# Patient Record
Sex: Female | Born: 1960
Health system: Southern US, Community
[De-identification: ages and names within clinical notes are randomized; demographics above are authoritative.]

## PROBLEM LIST (undated history)

## (undated) DIAGNOSIS — Z86718 Personal history of other venous thrombosis and embolism: Secondary | ICD-10-CM

## (undated) DIAGNOSIS — J45909 Unspecified asthma, uncomplicated: Secondary | ICD-10-CM

## (undated) DIAGNOSIS — R519 Headache, unspecified: Secondary | ICD-10-CM

## (undated) DIAGNOSIS — R896 Abnormal cytological findings in specimens from other organs, systems and tissues: Secondary | ICD-10-CM

## (undated) DIAGNOSIS — R569 Unspecified convulsions: Secondary | ICD-10-CM

## (undated) DIAGNOSIS — I10 Essential (primary) hypertension: Secondary | ICD-10-CM

## (undated) DIAGNOSIS — E785 Hyperlipidemia, unspecified: Secondary | ICD-10-CM

## (undated) DIAGNOSIS — H209 Unspecified iridocyclitis: Secondary | ICD-10-CM

## (undated) DIAGNOSIS — E669 Obesity, unspecified: Secondary | ICD-10-CM

## (undated) DIAGNOSIS — K219 Gastro-esophageal reflux disease without esophagitis: Secondary | ICD-10-CM

## (undated) DIAGNOSIS — T7840XA Allergy, unspecified, initial encounter: Secondary | ICD-10-CM

## (undated) DIAGNOSIS — R51 Headache: Secondary | ICD-10-CM

## (undated) DIAGNOSIS — IMO0002 Reserved for concepts with insufficient information to code with codable children: Secondary | ICD-10-CM

## (undated) HISTORY — DX: Obesity, unspecified: E66.9

## (undated) HISTORY — DX: Unspecified convulsions: R56.9

## (undated) HISTORY — DX: Hyperlipidemia, unspecified: E78.5

## (undated) HISTORY — DX: Allergy, unspecified, initial encounter: T78.40XA

## (undated) HISTORY — DX: Abnormal cytological findings in specimens from other organs, systems and tissues: R89.6

## (undated) HISTORY — DX: Unspecified iridocyclitis: H20.9

## (undated) HISTORY — PX: COLPOSCOPY: SHX161

## (undated) HISTORY — DX: Headache, unspecified: R51.9

## (undated) HISTORY — DX: Essential (primary) hypertension: I10

## (undated) HISTORY — PX: MOUTH SURGERY: SHX715

## (undated) HISTORY — PX: EYE SURGERY: SHX253

## (undated) HISTORY — DX: Headache: R51

## (undated) HISTORY — DX: Unspecified asthma, uncomplicated: J45.909

## (undated) HISTORY — DX: Reserved for concepts with insufficient information to code with codable children: IMO0002

## (undated) HISTORY — DX: Personal history of other venous thrombosis and embolism: Z86.718

## (undated) HISTORY — PX: TUBAL LIGATION: SHX77

## (undated) HISTORY — DX: Gastro-esophageal reflux disease without esophagitis: K21.9

---

## 2000-11-21 ENCOUNTER — Other Ambulatory Visit: Admission: RE | Admit: 2000-11-21 | Discharge: 2000-11-21 | Payer: Self-pay | Admitting: Gynecology

## 2002-01-17 ENCOUNTER — Other Ambulatory Visit: Admission: RE | Admit: 2002-01-17 | Discharge: 2002-01-17 | Payer: Self-pay | Admitting: Gynecology

## 2003-01-20 ENCOUNTER — Encounter: Payer: Self-pay | Admitting: Gastroenterology

## 2003-01-20 ENCOUNTER — Ambulatory Visit (HOSPITAL_COMMUNITY): Admission: RE | Admit: 2003-01-20 | Discharge: 2003-01-20 | Payer: Self-pay | Admitting: Gastroenterology

## 2003-07-01 ENCOUNTER — Other Ambulatory Visit: Admission: RE | Admit: 2003-07-01 | Discharge: 2003-07-01 | Payer: Self-pay | Admitting: Gynecology

## 2004-07-01 ENCOUNTER — Other Ambulatory Visit: Admission: RE | Admit: 2004-07-01 | Discharge: 2004-07-01 | Payer: Self-pay | Admitting: Gynecology

## 2004-08-06 DIAGNOSIS — IMO0002 Reserved for concepts with insufficient information to code with codable children: Secondary | ICD-10-CM

## 2004-08-06 HISTORY — DX: Reserved for concepts with insufficient information to code with codable children: IMO0002

## 2005-02-14 ENCOUNTER — Other Ambulatory Visit: Admission: RE | Admit: 2005-02-14 | Discharge: 2005-02-14 | Payer: Self-pay | Admitting: Gynecology

## 2005-07-06 ENCOUNTER — Other Ambulatory Visit: Admission: RE | Admit: 2005-07-06 | Discharge: 2005-07-06 | Payer: Self-pay | Admitting: Gynecology

## 2006-01-11 ENCOUNTER — Other Ambulatory Visit: Admission: RE | Admit: 2006-01-11 | Discharge: 2006-01-11 | Payer: Self-pay | Admitting: Gynecology

## 2006-07-07 DIAGNOSIS — IMO0001 Reserved for inherently not codable concepts without codable children: Secondary | ICD-10-CM

## 2006-07-07 HISTORY — DX: Reserved for inherently not codable concepts without codable children: IMO0001

## 2006-07-12 ENCOUNTER — Other Ambulatory Visit: Admission: RE | Admit: 2006-07-12 | Discharge: 2006-07-12 | Payer: Self-pay | Admitting: Gynecology

## 2006-10-25 ENCOUNTER — Other Ambulatory Visit: Admission: RE | Admit: 2006-10-25 | Discharge: 2006-10-25 | Payer: Self-pay | Admitting: Gynecology

## 2007-02-05 HISTORY — PX: ENDOMETRIAL ABLATION: SHX621

## 2007-02-05 HISTORY — PX: OTHER SURGICAL HISTORY: SHX169

## 2007-02-21 ENCOUNTER — Ambulatory Visit (HOSPITAL_BASED_OUTPATIENT_CLINIC_OR_DEPARTMENT_OTHER): Admission: RE | Admit: 2007-02-21 | Discharge: 2007-02-21 | Payer: Self-pay | Admitting: Gynecology

## 2007-02-21 ENCOUNTER — Encounter (INDEPENDENT_AMBULATORY_CARE_PROVIDER_SITE_OTHER): Payer: Self-pay | Admitting: Specialist

## 2007-07-18 ENCOUNTER — Other Ambulatory Visit: Admission: RE | Admit: 2007-07-18 | Discharge: 2007-07-18 | Payer: Self-pay | Admitting: Gynecology

## 2007-12-27 ENCOUNTER — Ambulatory Visit (HOSPITAL_COMMUNITY): Admission: RE | Admit: 2007-12-27 | Discharge: 2007-12-27 | Payer: Self-pay | Admitting: Ophthalmology

## 2008-01-27 ENCOUNTER — Other Ambulatory Visit: Admission: RE | Admit: 2008-01-27 | Discharge: 2008-01-27 | Payer: Self-pay | Admitting: Gynecology

## 2008-07-21 ENCOUNTER — Ambulatory Visit: Payer: Self-pay | Admitting: Gynecology

## 2008-07-21 ENCOUNTER — Encounter: Payer: Self-pay | Admitting: Gynecology

## 2008-07-21 ENCOUNTER — Other Ambulatory Visit: Admission: RE | Admit: 2008-07-21 | Discharge: 2008-07-21 | Payer: Self-pay | Admitting: Gynecology

## 2008-07-30 ENCOUNTER — Ambulatory Visit: Payer: Self-pay | Admitting: Gynecology

## 2009-07-22 ENCOUNTER — Encounter: Payer: Self-pay | Admitting: Gynecology

## 2009-07-22 ENCOUNTER — Other Ambulatory Visit: Admission: RE | Admit: 2009-07-22 | Discharge: 2009-07-22 | Payer: Self-pay | Admitting: Gynecology

## 2009-07-22 ENCOUNTER — Ambulatory Visit: Payer: Self-pay | Admitting: Gynecology

## 2009-09-28 ENCOUNTER — Ambulatory Visit: Payer: Self-pay | Admitting: Gynecology

## 2010-08-02 ENCOUNTER — Other Ambulatory Visit: Admission: RE | Admit: 2010-08-02 | Discharge: 2010-08-02 | Payer: Self-pay | Admitting: Gynecology

## 2010-08-02 ENCOUNTER — Ambulatory Visit: Payer: Self-pay | Admitting: Gynecology

## 2010-08-11 ENCOUNTER — Ambulatory Visit: Payer: Self-pay | Admitting: Gynecology

## 2011-03-24 NOTE — H&P (Signed)
NAME:  Claire Sutton, Claire Sutton NO.:  [id number]   MEDICAL RECORD NO.:  [id number]          PATIENT TYPE:  AMB   LOCATION:  [facility]                         FACILITY:  [facility]   PHYSICIAN:  Timothy P. Fontaine, M.D.DATE OF BIRTH:  [birth year]   DATE OF ADMISSION:  DATE OF DISCHARGE:                              HISTORY & PHYSICAL   Being admitted to [facility] on February 21, 2007 at 7:30  a.m. for surgery.   CHIEF COMPLAINT:  1. Menorrhagia.  2. Bartholin cyst.   HISTORY OF PRESENT ILLNESS:  A 50 year old G89, P2, AB1 female with a  history of postpartum tubal sterilization and a history of menorrhagia  that is interfering with her lifestyle.  The patient finds that she  needs to wear both tampons and pads; has bleed through episodes; and  wants to proceed with endometrial ablation.  The patient also has a left  Bartholin cyst that she has had for some time which she also wants to  have marsupialized at the same time as the ablation.   PAST MEDICAL HISTORY:  Uncomplicated.   PAST SURGICAL HISTORY:  Postpartum tubal sterilization.   CURRENT MEDICATIONS:  Xyzal for pruritus taken p.r.n. due to skin rash.   ALLERGIES:  No medications.   REVIEW OF SYSTEMS:  Noncontributory.   FAMILY HISTORY:  Noncontributory.   SOCIAL HISTORY:  Noncontributory.   ADMISSION PHYSICAL EXAM:  VITAL SIGNS:  Afebrile.  Vital signs stable.  HEENT: Normal.  LUNGS:  Clear.  CARDIAC:  Regular rate without rubs, murmurs or gallops.  ABDOMINAL EXAM:  Benign.  PELVIC:  External with 2-3 cm tense left Bartholin cyst, classic in  appearance, nontender.  Vagina and cervix normal.  Bimanual, uterus  normal size, midline mobile, nontender.  Adnexa without masses or  tenderness.   ASSESSMENT:  A 50 year old G58, P2, AB1 female status post postpartum  tubal sterilization with menorrhagia who underwent evaluation to include  normal thyroid function and a negative sonohistogram.  She is  known to  be anemic with a hemoglobin of 11.  Options for management were  reviewed; and she wants to proceed with endometrial ablation NovaSure  technique.  The patient also has a persistent left Bartholin cyst; that  she wants to go ahead and have marsupialized at the time of this  procedure.  I reviewed the proposed surgery with the patient.  The long-  term and short-term issues, acute intraoperative postoperative risks,  postoperative care, and follow-up all reviewed with her.  She  understands there are no guarantees as far as menorrhagia relief.  Her  periods may persistently continue to be heavy, or may resume being heavy  after some brief period of lightness; and she also understands that  there is no guarantee that this will eliminate all periods, as she will  more than likely continue to have periods, but hopefully much lighter.  The patient also understands that following ablation she should never  achieve pregnancy; and even though she has a tubal sterilization.  She  should do nothing to achieve pregnancy following the procedure as this  could be  dangerous.  What is involved with the procedure to include:  Dilatation, hysteroscope, curettage, and use of the endometrial ablator  were discussed.  The risks of injury during the process to include:  Uterine perforation, transuterine thermal damage, injury to bowel,  bladder, ureters, vessels, and nerves either immediately recognized or  delay recognized requiring emergency reparative surgeries; including  bowel resection, and ostomy formation was all discussed, understood and  accepted.  The risks of infection as well as the risk of hemorrhage  necessitating transfusion was reviewed with her.  She understands during  the marsupialization process we are not excising the cyst, but we are  going to create a window to allow drainage; and that she may have some  small deformity in this area such as a dimpling defect following the   procedure; and the risks of bleeding, infection as well as the risk of  pain either immediately postoperative as well as long-term vulvar  vaginal pain both spontaneous and dyspareunia were discussed,  understood, and accepted.  The patient's questions were answered to her  satisfaction.  She is ready to proceed with surgery.      Timothy P. Fontaine, M.D.  Electronically Signed     TPF/MEDQ  D:  02/15/2007  T:  02/15/2007  Job:  [id number]

## 2011-03-24 NOTE — Op Note (Signed)
NAME:  Claire Sutton, Claire Sutton NO.:  [id number]   MEDICAL RECORD NO.:  [id number]          PATIENT TYPE:  AMB   LOCATION:  [facility]                         FACILITY:  [facility]   PHYSICIAN:  Timothy P. Fontaine, M.D.DATE OF BIRTH:  12-26-1960   DATE OF PROCEDURE:  02/21/2007  DATE OF DISCHARGE:                               OPERATIVE REPORT   LOCATION:  [facility].   PREOPERATIVE DIAGNOSES:  1. Menorrhagia.  2. Left Bartholin cyst.   POSTOPERATIVE DIAGNOSES:  1. Menorrhagia.  2. Left Bartholin cyst.   PROCEDURE:  1. NovaSure endometrial ablation.  2. Hysteroscopy.  3. Marsupialization, left Bartholin cyst.   SURGEON:  Timothy P. Fontaine, M.D.   ANESTHETIC:  General with 1% lidocaine paracervical block.   SPECIMEN:  Bartholin cyst wall.   ESTIMATED BLOOD LOSS:  Minimal.   HYSTEROSCOPIC DISTENDING MEDIAN DISCREPANCY:  Minimal.   COMPLICATIONS:  None.   FINDINGS:  EUA:  Three-centimeter tense left Bartholin cyst, vulva  otherwise normal.  Speculum:  Vagina normal.  Cervix normal.  Bimanual:  Uterus normal size, midline and mobile.  Adnexa without masses.  Hysteroscopic:  Cavities is grossly normal, noting fundus, anterior and  posterior uterine surfaces, right lower uterine segment, endocervical  canal normal with good post-ablative even treatment noted.  NovaSure  measurements:  Sounding length 9 cm, cervical length 3.5 cm, cavity  length 5.5 cm, cavity width 4.3, power of 130 for 105 seconds.   PROCEDURE:  The patient was taken to the operating room, underwent  general anesthesia, was placed in the low dorsal lithotomy position, and  received a perineovaginal preparation with Betadine solution.  The  patient was draped in the usual fashion.  The patient was not  catheterized, as she voided a large amount immediately prior to moving  to the operating room.  A bimanual EUA was performed.  Subsequently, the  cervix was visualized with a speculum,  anterior lip grasped with a  single-tooth tenaculum and a paracervical block using 1% lidocaine, a  total of 10 mL, was placed.  Cervix was gently and gradually dilated,  determining both the cervical length and the sounding length at this  time.  Subsequently, the NovaSure instrument was placed within the  cavity, engaged, manipulated to assure proper placement, cavity width  determined and the carbon dioxide test was performed and passed.  Subsequently, the ablation was performed without difficulty, the  instrument removed.  Hysteroscopy showed a normal even-treated cavity  with good distention, no evidence of perforation.  Tenaculum was  removed, speculum removed and attention was then turned to the Bartholin  cyst.  The labia minora on the left were retracted laterally and on the  inner surface of the introital opening, just exterior to the hymenal  ring, an elliptical mucosal incision was made and the overlying skin  removed.  The mucosal tissues were incised to expose the cyst wall,  which was subsequently entered and had a greenish cyst fluid.  A large  portion of the cyst wall was excised, sent to Pathology and the  remaining cyst wall was then sutured to the  surrounding mucosa in  interrupted 3-0 Vicryl to marsupialize the remaining cyst cavity.  Digital palpation of the remaining cavity revealed a smooth  architecture; there was no evidence of solid elements.  Adequate  hemostasis was visualized.  The patient was placed in the supine  position, awakened without difficulty and taken to the recovery room in  good condition, having tolerated the procedure well.      Timothy P. Fontaine, M.D.  Electronically Signed     TPF/MEDQ  D:  02/21/2007  T:  02/21/2007  Job:  [id number]

## 2011-07-28 DIAGNOSIS — IMO0002 Reserved for concepts with insufficient information to code with codable children: Secondary | ICD-10-CM | POA: Insufficient documentation

## 2011-07-31 LAB — ANA: Anti Nuclear Antibody(ANA): NEGATIVE

## 2011-07-31 LAB — C-REACTIVE PROTEIN: CRP: 0.5 — ABNORMAL LOW (ref <0.6)

## 2011-07-31 LAB — COMPREHENSIVE METABOLIC PANEL
ALT: 24
Albumin: 3.5
Calcium: 9.3
GFR calc Af Amer: >60
Glucose, Bld: 90
Sodium: 136
Total Protein: 6.6

## 2011-07-31 LAB — KETONES, QUALITATIVE: Acetone, Bld: NEGATIVE

## 2011-07-31 LAB — LYSOZYME, SERUM: Lysozyme: 13 ug/mL (ref 9–17)

## 2011-08-04 ENCOUNTER — Other Ambulatory Visit (HOSPITAL_COMMUNITY)
Admission: RE | Admit: 2011-08-04 | Discharge: 2011-08-04 | Disposition: A | Discharge status: Home / Self Care | Payer: 59 | Source: Ambulatory Visit | Attending: Gynecology | Admitting: Gynecology

## 2011-08-04 ENCOUNTER — Ambulatory Visit (INDEPENDENT_AMBULATORY_CARE_PROVIDER_SITE_OTHER): Payer: 59 | Admitting: Gynecology

## 2011-08-04 ENCOUNTER — Encounter: Payer: Self-pay | Admitting: Gynecology

## 2011-08-04 VITALS — BP 130/84 | Ht 65.0 in | Wt 230.0 lb

## 2011-08-04 DIAGNOSIS — B373 Candidiasis of vulva and vagina: Secondary | ICD-10-CM

## 2011-08-04 DIAGNOSIS — Z13220 Encounter for screening for lipoid disorders: Secondary | ICD-10-CM

## 2011-08-04 DIAGNOSIS — Z131 Encounter for screening for diabetes mellitus: Secondary | ICD-10-CM

## 2011-08-04 DIAGNOSIS — Z01419 Encounter for gynecological examination (general) (routine) without abnormal findings: Secondary | ICD-10-CM

## 2011-08-04 DIAGNOSIS — N951 Menopausal and female climacteric states: Secondary | ICD-10-CM

## 2011-08-04 DIAGNOSIS — N898 Other specified noninflammatory disorders of vagina: Secondary | ICD-10-CM

## 2011-08-04 DIAGNOSIS — E559 Vitamin D deficiency, unspecified: Secondary | ICD-10-CM

## 2011-08-04 MED ORDER — FLUCONAZOLE 150 MG PO TABS: 150.0000 mg | ORAL_TABLET | Freq: Once | ORAL | Status: AC

## 2011-08-04 NOTE — Progress Notes (Signed)
Claire Sutton [birth year] [id number]        50 y.o.  for annual exam.  Overall doing well. She does note the onset of some hot flushes and night sweats this past year. She is status post endometrial ablation is having no menses.  Notes an itchy discharge that comes and goes.  Past medical history,surgical history, medications, allergies, family history and social history were all reviewed and documented in the EPIC chart. ROS:  Was performed and pertinent positives and negatives are included in the history.  Exam: chaperone present Filed Vitals:   08/04/11 0954  BP: 130/84   General appearance  Normal Skin grossly normal Head/Neck normal with no cervical or supraclavicular adenopathy thyroid normal Lungs  clear Cardiac RR, without RMG Abdominal  soft, nontender, without masses, organomegaly or hernia Breasts  examined lying and sitting without masses, retractions, discharge or axillary adenopathy. Pelvic  Ext/BUS/vagina  normal with white discharge KOH wet prep done  Cervix  normal  Pap done  Uterus  anteverted, normal size, shape and contour, midline and mobile nontender   Adnexa  Without masses or tenderness    Anus and perineum  normal   Rectovaginal  normal sphincter tone without palpated masses or tenderness.    Assessment/Plan:  50 y.o. female for annual exam.    1. White discharge. KOH Wet prep positive for yeast we'll treat with Diflucan 150x1 dose follow up if symptoms persist or recur. 2. Menopausal symptoms. Patient is having some hot flashes and sweats. She is amenorrheic from her ablation. FSH checked last year was normal.  Will recheck baseline FSH. OTC soy recommended. I reviewed in general HRT and the quality of life issue and if her hot flushes become worse she'll represent for HRT discussion. 3. Health maintenance. Self breast exams on a monthly basis discussed encouraged. Is due for mammography in November knows to schedule this. I recommend a baseline colonoscopy  and I gave her names of groups increase her to call and arrange.  She has been known to be low and vitamin D before and ordered a vitamin D level along with routine glucose lipid profile CBC and urinalysis. Assuming she continues well from a gynecologic standpoint she does well with the soy she'll see me in a year sooner if menopausal symptoms worsen and/or any other issue.   Dara Lords MD, 10:40 AM 08/04/2011

## 2011-08-05 LAB — VITAMIN D 25 HYDROXY (VIT D DEFICIENCY, FRACTURES): Vit D, 25-Hydroxy: 38 ng/mL (ref 30–89)

## 2011-08-07 ENCOUNTER — Other Ambulatory Visit: Payer: Self-pay | Admitting: *Deleted

## 2011-08-07 DIAGNOSIS — D649 Anemia, unspecified: Secondary | ICD-10-CM

## 2011-08-07 DIAGNOSIS — E7800 Pure hypercholesterolemia, unspecified: Secondary | ICD-10-CM

## 2011-08-09 ENCOUNTER — Other Ambulatory Visit (INDEPENDENT_AMBULATORY_CARE_PROVIDER_SITE_OTHER): Payer: 59 | Admitting: *Deleted

## 2011-08-09 DIAGNOSIS — E7800 Pure hypercholesterolemia, unspecified: Secondary | ICD-10-CM

## 2011-08-09 DIAGNOSIS — D649 Anemia, unspecified: Secondary | ICD-10-CM

## 2011-09-04 ENCOUNTER — Telehealth: Payer: Self-pay | Admitting: *Deleted

## 2011-09-04 NOTE — Telephone Encounter (Signed)
Patient called for results.  Gave results.

## 2011-10-11 ENCOUNTER — Ambulatory Visit: Payer: Self-pay

## 2011-10-11 DIAGNOSIS — T887XXA Unspecified adverse effect of drug or medicament, initial encounter: Secondary | ICD-10-CM

## 2011-10-11 DIAGNOSIS — R220 Localized swelling, mass and lump, head: Secondary | ICD-10-CM

## 2011-10-11 DIAGNOSIS — Z0189 Encounter for other specified special examinations: Secondary | ICD-10-CM

## 2011-10-20 ENCOUNTER — Ambulatory Visit: Payer: Self-pay

## 2011-10-20 DIAGNOSIS — L509 Urticaria, unspecified: Secondary | ICD-10-CM

## 2011-10-20 DIAGNOSIS — L299 Pruritus, unspecified: Secondary | ICD-10-CM

## 2011-10-20 DIAGNOSIS — R21 Rash and other nonspecific skin eruption: Secondary | ICD-10-CM

## 2011-11-27 ENCOUNTER — Ambulatory Visit: Payer: Self-pay | Admitting: Internal Medicine

## 2011-11-27 DIAGNOSIS — Z719 Counseling, unspecified: Secondary | ICD-10-CM

## 2011-11-27 DIAGNOSIS — J301 Allergic rhinitis due to pollen: Secondary | ICD-10-CM

## 2012-10-11 ENCOUNTER — Encounter: Payer: Self-pay | Admitting: Gynecology

## 2012-10-25 ENCOUNTER — Encounter: Payer: Self-pay | Admitting: Gynecology

## 2012-10-25 ENCOUNTER — Ambulatory Visit (INDEPENDENT_AMBULATORY_CARE_PROVIDER_SITE_OTHER): Payer: BC Managed Care – PPO | Admitting: Gynecology

## 2012-10-25 VITALS — BP 118/70 | Ht 65.75 in | Wt 175.0 lb

## 2012-10-25 DIAGNOSIS — L292 Pruritus vulvae: Secondary | ICD-10-CM

## 2012-10-25 DIAGNOSIS — L293 Anogenital pruritus, unspecified: Secondary | ICD-10-CM

## 2012-10-25 DIAGNOSIS — Z01419 Encounter for gynecological examination (general) (routine) without abnormal findings: Secondary | ICD-10-CM

## 2012-10-25 MED ORDER — FLUCONAZOLE 150 MG PO TABS: 150.0000 mg | ORAL_TABLET | Freq: Once | ORAL | Status: DC

## 2012-10-25 NOTE — Progress Notes (Signed)
Claire Sutton 1961-05-08 [id number]        51 y.o.  [id number] for annual exam.    Past medical history,surgical history, medications, allergies, family history and social history were all reviewed and documented in the EPIC chart. ROS:  Was performed and pertinent positives and negatives are included in the history.  Exam: Sherrilyn Rist assistant Filed Vitals:   10/25/12 1148  BP: 118/70  Height: 5' 5.75" (1.67 m)  Weight: 175 lb (79.379 kg)   General appearance  Normal Skin grossly normal Head/Neck normal with no cervical or supraclavicular adenopathy thyroid normal Lungs  clear Cardiac RR, without RMG Abdominal  soft, nontender, without masses, organomegaly or hernia Breasts  examined lying and sitting without masses, retractions, discharge or axillary adenopathy. Pelvic  Ext/BUS/vagina  normal   Cervix  normal   Uterus  anteverted, normal size, shape and contour, midline and mobile nontender   Adnexa  Without masses or tenderness    Anus and perineum  normal   Rectovaginal  normal sphincter tone without palpated masses or tenderness.    Assessment/Plan:  51 y.o. [id number] female for annual exam.   1. Amenorrhea since ablation. Occasional hot flushes but not bothersome. FSH checked last year was normal. We'll monitor at present. 2. Intermittent occasional vulvar pruritus. Not bothersome today. Exam was normal. I did give her Diflucan 150 mg #3 pills that were available to use when necessary if she does develop itching as I suspect this is yeast related. 3. Pap smear 07/2011. No Pap smear done today. History of low-grade SIL 2005/2006. Pap smears all normal annual leave afterwards. We'll plan every 3 year to 5 years screening. 4. Mammography 2011. Patient knows she needs to schedule now agrees to do so. SBE monthly reviewed. 5. Colonoscopy. Arrange for screening colonoscopy at [facility] or [facility]. 6. DEXA. Plan Baseline closure to 60. Increase calcium vitamin D. 7. Health maintenance.  Patient reports having routine blood work at Dr. [facility] office. Continue to follow up with him 4 routine health care and blood work. Follow up for GYN exam in 1 year.    Dara Lords MD, 12:08 PM 10/25/2012

## 2012-10-25 NOTE — Patient Instructions (Addendum)
Call to schedule screening COLONOSCOPY at [facility] or [facility]   Call to Schedule your mammogram  Facilities in [place]: 1)  [facility] [place], [place] [address] Phone: [phone] 2)  [facility], [phone] Phone: [phone] 3)  Dr. Yolanda Bonine at [facility] [address] Phone: [phone]     Mammogram A mammogram is an X-ray test to find changes in a woman's breast. You should get a mammogram if:  You are 50 years of age or older  You have risk factors.   Your doctor recommends that you have one.  BEFORE THE TEST  Do not schedule the test the week before your period, especially if your breasts are sore during this time.  On the day of your mammogram:  Wash your breasts and armpits well. After washing, do not put on any deodorant or talcum powder on until after your test.   Eat and drink as you usually do.   Take your medicines as usual.   If you are diabetic and take insulin, make sure you:   Eat before coming for your test.   Take your insulin as usual.   If you cannot keep your appointment, call before the appointment to cancel. Schedule another appointment.  TEST  You will need to undress from the waist up. You will put on a hospital gown.   Your breast will be put on the mammogram machine, and it will press firmly on your breast with a piece of plastic called a compression paddle. This will make your breast flatter so that the machine can X-ray all parts of your breast.   Both breasts will be X-rayed. Each breast will be X-rayed from above and from the side. An X-ray might need to be taken again if the picture is not good enough.   The mammogram will last about 15 to 30 minutes.  AFTER THE TEST Finding out the results of your test Ask when your test results will be ready. Make sure you get your test results.  Document  Released: 01/19/2009 Document Revised: 10/12/2011 Document Reviewed: 01/19/2009 [facility] Patient Information 2012 [place], [place].

## 2012-10-26 LAB — URINALYSIS W MICROSCOPIC + REFLEX CULTURE
Casts: NONE SEEN
Hgb urine dipstick: NEGATIVE
Leukocytes, UA: NEGATIVE
Nitrite: NEGATIVE
Squamous Epithelial / LPF: NONE SEEN
pH: 6.5 (ref 5.0–8.0)

## 2013-06-02 ENCOUNTER — Other Ambulatory Visit: Payer: Self-pay | Admitting: Gastroenterology

## 2013-07-24 ENCOUNTER — Other Ambulatory Visit: Payer: Self-pay | Admitting: Internal Medicine

## 2013-07-24 DIAGNOSIS — Z1231 Encounter for screening mammogram for malignant neoplasm of breast: Secondary | ICD-10-CM

## 2013-08-13 ENCOUNTER — Ambulatory Visit
Admission: RE | Admit: 2013-08-13 | Discharge: 2013-08-13 | Disposition: A | Discharge status: Home / Self Care | Payer: BC Managed Care – PPO | Source: Ambulatory Visit | Attending: Internal Medicine | Admitting: Internal Medicine

## 2013-08-13 DIAGNOSIS — Z1231 Encounter for screening mammogram for malignant neoplasm of breast: Secondary | ICD-10-CM

## 2013-10-28 ENCOUNTER — Ambulatory Visit (INDEPENDENT_AMBULATORY_CARE_PROVIDER_SITE_OTHER): Payer: BC Managed Care – PPO | Admitting: Gynecology

## 2013-10-28 ENCOUNTER — Encounter: Payer: Self-pay | Admitting: Gynecology

## 2013-10-28 VITALS — BP 112/78 | Ht 66.75 in | Wt 193.8 lb

## 2013-10-28 DIAGNOSIS — Z01419 Encounter for gynecological examination (general) (routine) without abnormal findings: Secondary | ICD-10-CM

## 2013-10-28 DIAGNOSIS — N951 Menopausal and female climacteric states: Secondary | ICD-10-CM

## 2013-10-28 NOTE — Progress Notes (Signed)
Claire Sutton 12-25-1960 [id number]        52 y.o.  [id number] for annual exam.  Doing well.  Past medical history,surgical history, problem list, medications, allergies, family history and social history were all reviewed and documented in the EPIC chart.  ROS:  Performed and pertinent positives and negatives are included in the history, assessment and plan .  Exam: Sherrilyn Rist assistant Filed Vitals:   10/28/13 0907  BP: 112/78  Height: 5' 6.75" (1.695 m)  Weight: 193 lb 12.8 oz (87.907 kg)   General appearance  Normal Skin grossly normal Head/Neck normal with no cervical or supraclavicular adenopathy thyroid normal Lungs  clear Cardiac RR, without RMG Abdominal  soft, nontender, without masses, organomegaly or hernia Breasts  examined lying and sitting without masses, retractions, discharge or axillary adenopathy. Pelvic  Ext/BUS/vagina  Normal with mild atrophic changes  Cervix  Normal   Uterus  anteverted, normal size, shape and contour, midline and mobile nontender   Adnexa  Without masses or tenderness    Anus and perineum  Normal   Rectovaginal  Normal sphincter tone without palpated masses or tenderness.    Assessment/Plan:  52 y.o. [id number] female for annual exam.   1. Amenorrheic , mild hot flushes. FSH normal 2 years ago. Recheck FSH now. Has remained amenorrheic since her endometrial ablation. Patient will followup for results. Monitor symptoms at present. If worsens and wants to discuss HRT will represent. If any bleeding will represent. 2. Pap smear 2012. No Pap smear done today. History of LGSIL 2005. Followup Pap smears normal. Plan repeat Pap smear next year at 3 year interval. 3. Mammography 08/2013. Continue with annual mammography. SBE monthly reviewed. 4. Colonoscopy 2014. Repeat at their recommended interval. 5. DEXA never. Plan closer to 59. 6. Health maintenance. No routine blood work done as this is all done through her primary physician's office. Followup in  one year, sooner as needed.   Note: This document was prepared with digital dictation and possible smart phrase technology. Any transcriptional errors that result from this process are unintentional.   Dara Lords MD, 9:43 AM 10/28/2013

## 2013-10-28 NOTE — Patient Instructions (Signed)
Follow up in one year, sooner as needed. 

## 2013-10-29 LAB — URINALYSIS W MICROSCOPIC + REFLEX CULTURE
Casts: NONE SEEN
Glucose, UA: NEGATIVE mg/dL
Hgb urine dipstick: NEGATIVE
Leukocytes, UA: NEGATIVE
pH: 5.5 (ref 5.0–8.0)

## 2014-06-16 DIAGNOSIS — H15002 Unspecified scleritis, left eye: Secondary | ICD-10-CM | POA: Insufficient documentation

## 2014-06-16 DIAGNOSIS — H3022 Posterior cyclitis, left eye: Secondary | ICD-10-CM | POA: Insufficient documentation

## 2014-06-16 DIAGNOSIS — H2012 Chronic iridocyclitis, left eye: Secondary | ICD-10-CM

## 2014-06-16 DIAGNOSIS — H15102 Unspecified episcleritis, left eye: Secondary | ICD-10-CM

## 2014-08-17 ENCOUNTER — Other Ambulatory Visit: Payer: Self-pay | Admitting: Gynecology

## 2014-08-17 DIAGNOSIS — Z1231 Encounter for screening mammogram for malignant neoplasm of breast: Secondary | ICD-10-CM

## 2014-08-18 ENCOUNTER — Ambulatory Visit (HOSPITAL_BASED_OUTPATIENT_CLINIC_OR_DEPARTMENT_OTHER)
Admission: RE | Admit: 2014-08-18 | Discharge: 2014-08-18 | Disposition: A | Discharge status: Home / Self Care | Payer: Self-pay | Source: Ambulatory Visit | Attending: Gynecology | Admitting: Gynecology

## 2014-08-18 DIAGNOSIS — Z1231 Encounter for screening mammogram for malignant neoplasm of breast: Secondary | ICD-10-CM | POA: Insufficient documentation

## 2014-09-07 ENCOUNTER — Encounter: Payer: Self-pay | Admitting: Gynecology

## 2014-11-03 ENCOUNTER — Encounter: Payer: BC Managed Care – PPO | Admitting: Gynecology

## 2014-11-12 ENCOUNTER — Encounter: Payer: BC Managed Care – PPO | Admitting: Gynecology

## 2014-11-13 ENCOUNTER — Other Ambulatory Visit (HOSPITAL_COMMUNITY)
Admission: RE | Admit: 2014-11-13 | Discharge: 2014-11-13 | Disposition: A | Discharge status: Home / Self Care | Payer: 59 | Source: Ambulatory Visit | Attending: Gynecology | Admitting: Gynecology

## 2014-11-13 ENCOUNTER — Encounter: Payer: Self-pay | Admitting: Gynecology

## 2014-11-13 ENCOUNTER — Ambulatory Visit (INDEPENDENT_AMBULATORY_CARE_PROVIDER_SITE_OTHER): Payer: 59 | Admitting: Gynecology

## 2014-11-13 VITALS — BP 124/84 | Ht 66.0 in | Wt 235.0 lb

## 2014-11-13 DIAGNOSIS — Z01419 Encounter for gynecological examination (general) (routine) without abnormal findings: Secondary | ICD-10-CM | POA: Insufficient documentation

## 2014-11-13 DIAGNOSIS — Z1151 Encounter for screening for human papillomavirus (HPV): Secondary | ICD-10-CM | POA: Diagnosis present

## 2014-11-13 DIAGNOSIS — R635 Abnormal weight gain: Secondary | ICD-10-CM

## 2014-11-13 LAB — URINALYSIS W MICROSCOPIC + REFLEX CULTURE
BILIRUBIN URINE: NEGATIVE
Bacteria, UA: NONE SEEN
Casts: NONE SEEN
Crystals: NONE SEEN
GLUCOSE, UA: NEGATIVE mg/dL
HGB URINE DIPSTICK: NEGATIVE
KETONES UR: NEGATIVE mg/dL
LEUKOCYTES UA: NEGATIVE
NITRITE: NEGATIVE
Protein, ur: NEGATIVE mg/dL
SPECIFIC GRAVITY, URINE: 1.017 (ref 1.005–1.030)
Squamous Epithelial / LPF: NONE SEEN
UROBILINOGEN UA: 0.2 mg/dL (ref 0.0–1.0)
pH: 5.5 (ref 5.0–8.0)

## 2014-11-13 LAB — THYROID PANEL
FREE T4: 1.04 ng/dL (ref 0.80–1.80)
T3 UPTAKE: 26 % (ref 22–35)
T4 TOTAL: 6.9 ug/dL (ref 4.5–12.0)
TSH: 1.686 u[IU]/mL (ref 0.350–4.500)

## 2014-11-13 LAB — FOLLICLE STIMULATING HORMONE: FSH: 5.8 m[IU]/mL

## 2014-11-13 NOTE — Progress Notes (Signed)
AZARIAH LATENDRESSE 1961/05/20 [id number]        54 y.o.  [id number] for annual exam.  Several issues noted below.  Past medical history,surgical history, problem list, medications, allergies, family history and social history were all reviewed and documented as reviewed in the EPIC chart.  ROS:  Performed with pertinent positives and negatives included in the history, assessment and plan.   Additional significant findings :  none   Exam: Kim Counsellor Vitals:   11/13/14 0800  BP: 124/84  Height: 5\' 6"  (1.676 m)  Weight: 235 lb (106.595 kg)   General appearance:  Normal affect, orientation and appearance. Skin: Grossly normal HEENT: Without gross lesions.  No cervical or supraclavicular adenopathy. Thyroid normal.  Lungs:  Clear without wheezing, rales or rhonchi Cardiac: RR, without RMG Abdominal:  Soft, nontender, without masses, guarding, rebound, organomegaly or hernia Breasts:  Examined lying and sitting without masses, retractions, discharge or axillary adenopathy. Pelvic:  Ext/BUS/vagina normal  Cervix normal. Pap/HPV  Uterus anteverted, normal size, shape and contour, midline and mobile nontender   Adnexa  Without masses or tenderness    Anus and perineum  Normal   Rectovaginal  Normal sphincter tone without palpated masses or tenderness.    Assessment/Plan:  54 y.o. [id number] female for annual exam without menses, not sexually active.   1. Amenorrhea. Patient status post endometrial ablation. Not having significant hot flashes or night sweats. Will check [facility] for menopausal status. Call if any  Bleeding. 2. Weight gain. Patient notes 40 pounds of weight gain over the past year or so. Admits to not exercising on a regular basis. Check thyroid panel. Encouraged regular exercise and diet modification. 3. Pap smear 2012. Pap/HPV today. No history of significant abnormal Pap smears. 4. Mammography 08/2014. Continue with annual mammography. SBE monthly reviewed. 5. Colonoscopy  2014. Repeat at their recommended interval. 6. Health maintenance. No routine blood work done as patient reports this done at her primary physician's office. Follow up 1 year, sooner as needed.      Anastasio Auerbach MD, 8:24 AM 11/13/2014

## 2014-11-13 NOTE — Patient Instructions (Signed)
You may obtain a copy of any labs that were done today by logging onto MyChart as outlined in the instructions provided with your AVS (after visit summary). The office will not call with normal lab results but certainly if there are any significant abnormalities then we will contact you.   Health Maintenance, Female A healthy lifestyle and preventative care can promote health and wellness.  Maintain regular health, dental, and eye exams.  Eat a healthy diet. Foods like vegetables, fruits, whole grains, low-fat dairy products, and lean protein foods contain the nutrients you need without too many calories. Decrease your intake of foods high in solid fats, added sugars, and salt. Get information about a proper diet from your caregiver, if necessary.  Regular physical exercise is one of the most important things you can do for your health. Most adults should get at least 150 minutes of moderate-intensity exercise (any activity that increases your heart rate and causes you to sweat) each week. In addition, most adults need muscle-strengthening exercises on 2 or more days a week.   Maintain a healthy weight. The body mass index (BMI) is a screening tool to identify possible weight problems. It provides an estimate of body fat based on height and weight. Your caregiver can help determine your BMI, and can help you achieve or maintain a healthy weight. For adults 20 years and older:  A BMI below 18.5 is considered underweight.  A BMI of 18.5 to 24.9 is normal.  A BMI of 25 to 29.9 is considered overweight.  A BMI of 30 and above is considered obese.  Maintain normal blood lipids and cholesterol by exercising and minimizing your intake of saturated fat. Eat a balanced diet with plenty of fruits and vegetables. Blood tests for lipids and cholesterol should begin at age 61 and be repeated every 5 years. If your lipid or cholesterol levels are high, you are over 50, or you are a high risk for heart  disease, you may need your cholesterol levels checked more frequently.Ongoing high lipid and cholesterol levels should be treated with medicines if diet and exercise are not effective.  If you smoke, find out from your caregiver how to quit. If you do not use tobacco, do not start.  Lung cancer screening is recommended for adults aged 33 80 years who are at high risk for developing lung cancer because of a history of smoking. Yearly low-dose computed tomography (CT) is recommended for people who have at least a 30-pack-year history of smoking and are a current smoker or have quit within the past 15 years. A pack year of smoking is smoking an average of 1 pack of cigarettes a day for 1 year (for example: 1 pack a day for 30 years or 2 packs a day for 15 years). Yearly screening should continue until the smoker has stopped smoking for at least 15 years. Yearly screening should also be stopped for people who develop a health problem that would prevent them from having lung cancer treatment.  If you are pregnant, do not drink alcohol. If you are breastfeeding, be very cautious about drinking alcohol. If you are not pregnant and choose to drink alcohol, do not exceed 1 drink per day. One drink is considered to be 12 ounces (355 mL) of beer, 5 ounces (148 mL) of wine, or 1.5 ounces (44 mL) of liquor.  Avoid use of street drugs. Do not share needles with anyone. Ask for help if you need support or instructions about stopping  the use of drugs.  High blood pressure causes heart disease and increases the risk of stroke. Blood pressure should be checked at least every 1 to 2 years. Ongoing high blood pressure should be treated with medicines, if weight loss and exercise are not effective.  If you are 59 to 55 years old, ask your caregiver if you should take aspirin to prevent strokes.  Diabetes screening involves taking a blood sample to check your fasting blood sugar level. This should be done once every 3  years, after age 91, if you are within normal weight and without risk factors for diabetes. Testing should be considered at a younger age or be carried out more frequently if you are overweight and have at least 1 risk factor for diabetes.  Breast cancer screening is essential preventative care for women. You should practice "breast self-awareness." This means understanding the normal appearance and feel of your breasts and may include breast self-examination. Any changes detected, no matter how small, should be reported to a caregiver. Women in their 66s and 30s should have a clinical breast exam (CBE) by a caregiver as part of a regular health exam every 1 to 3 years. After age 101, women should have a CBE every year. Starting at age 100, women should consider having a mammogram (breast X-ray) every year. Women who have a family history of breast cancer should talk to their caregiver about genetic screening. Women at a high risk of breast cancer should talk to their caregiver about having an MRI and a mammogram every year.  Breast cancer gene (BRCA)-related cancer risk assessment is recommended for women who have family members with BRCA-related cancers. BRCA-related cancers include breast, ovarian, tubal, and peritoneal cancers. Having family members with these cancers may be associated with an increased risk for harmful changes (mutations) in the breast cancer genes BRCA1 and BRCA2. Results of the assessment will determine the need for genetic counseling and BRCA1 and BRCA2 testing.  The Pap test is a screening test for cervical cancer. Women should have a Pap test starting at age 57. Between ages 25 and 35, Pap tests should be repeated every 2 years. Beginning at age 37, you should have a Pap test every 3 years as long as the past 3 Pap tests have been normal. If you had a hysterectomy for a problem that was not cancer or a condition that could lead to cancer, then you no longer need Pap tests. If you are  between ages 50 and 76, and you have had normal Pap tests going back 10 years, you no longer need Pap tests. If you have had past treatment for cervical cancer or a condition that could lead to cancer, you need Pap tests and screening for cancer for at least 20 years after your treatment. If Pap tests have been discontinued, risk factors (such as a new sexual partner) need to be reassessed to determine if screening should be resumed. Some women have medical problems that increase the chance of getting cervical cancer. In these cases, your caregiver may recommend more frequent screening and Pap tests.  The human papillomavirus (HPV) test is an additional test that may be used for cervical cancer screening. The HPV test looks for the virus that can cause the cell changes on the cervix. The cells collected during the Pap test can be tested for HPV. The HPV test could be used to screen women aged 44 years and older, and should be used in women of any age  who have unclear Pap test results. After the age of 55, women should have HPV testing at the same frequency as a Pap test.  Colorectal cancer can be detected and often prevented. Most routine colorectal cancer screening begins at the age of 44 and continues through age 20. However, your caregiver may recommend screening at an earlier age if you have risk factors for colon cancer. On a yearly basis, your caregiver may provide home test kits to check for hidden blood in the stool. Use of a small camera at the end of a tube, to directly examine the colon (sigmoidoscopy or colonoscopy), can detect the earliest forms of colorectal cancer. Talk to your caregiver about this at age 86, when routine screening begins. Direct examination of the colon should be repeated every 5 to 10 years through age 13, unless early forms of pre-cancerous polyps or small growths are found.  Hepatitis C blood testing is recommended for all people born from [birth year] through 1965 and any  individual with known risks for hepatitis C.  Practice safe sex. Use condoms and avoid high-risk sexual practices to reduce the spread of sexually transmitted infections (STIs). Sexually active women aged 36 and younger should be checked for Chlamydia, which is a common sexually transmitted infection. Older women with new or multiple partners should also be tested for Chlamydia. Testing for other STIs is recommended if you are sexually active and at increased risk.  Osteoporosis is a disease in which the bones lose minerals and strength with aging. This can result in serious bone fractures. The risk of osteoporosis can be identified using a bone density scan. Women ages 20 and over and women at risk for fractures or osteoporosis should discuss screening with their caregivers. Ask your caregiver whether you should be taking a calcium supplement or vitamin D to reduce the rate of osteoporosis.  Menopause can be associated with physical symptoms and risks. Hormone replacement therapy is available to decrease symptoms and risks. You should talk to your caregiver about whether hormone replacement therapy is right for you.  Use sunscreen. Apply sunscreen liberally and repeatedly throughout the day. You should seek shade when your shadow is shorter than you. Protect yourself by wearing long sleeves, pants, a wide-brimmed hat, and sunglasses year round, whenever you are outdoors.  Notify your caregiver of new moles or changes in moles, especially if there is a change in shape or color. Also notify your caregiver if a mole is larger than the size of a pencil eraser.  Stay current with your immunizations. Document Released: 05/08/2011 Document Revised: 02/17/2013 Document Reviewed: 05/08/2011 [facility] Patient Information 2014 [employer].

## 2014-11-13 NOTE — Addendum Note (Signed)
Addended by: Nelva Nay on: 11/13/2014 08:43 AM   Modules accepted: Orders, SmartSet

## 2014-11-16 LAB — CYTOLOGY - PAP

## 2015-08-05 ENCOUNTER — Other Ambulatory Visit: Payer: Self-pay | Admitting: Gynecology

## 2015-08-05 DIAGNOSIS — Z1231 Encounter for screening mammogram for malignant neoplasm of breast: Secondary | ICD-10-CM

## 2015-08-10 ENCOUNTER — Ambulatory Visit (HOSPITAL_BASED_OUTPATIENT_CLINIC_OR_DEPARTMENT_OTHER)
Admission: RE | Admit: 2015-08-10 | Discharge: 2015-08-10 | Disposition: A | Discharge status: Home / Self Care | Payer: 59 | Source: Ambulatory Visit | Attending: Gynecology | Admitting: Gynecology

## 2015-08-10 DIAGNOSIS — Z1231 Encounter for screening mammogram for malignant neoplasm of breast: Secondary | ICD-10-CM | POA: Diagnosis not present

## 2015-08-10 DIAGNOSIS — R928 Other abnormal and inconclusive findings on diagnostic imaging of breast: Secondary | ICD-10-CM | POA: Insufficient documentation

## 2015-08-12 ENCOUNTER — Other Ambulatory Visit: Payer: Self-pay | Admitting: Gynecology

## 2015-08-12 DIAGNOSIS — R928 Other abnormal and inconclusive findings on diagnostic imaging of breast: Secondary | ICD-10-CM

## 2015-08-20 ENCOUNTER — Ambulatory Visit
Admission: RE | Admit: 2015-08-20 | Discharge: 2015-08-20 | Disposition: A | Discharge status: Home / Self Care | Payer: 59 | Source: Ambulatory Visit | Attending: Gynecology | Admitting: Gynecology

## 2015-08-20 DIAGNOSIS — R928 Other abnormal and inconclusive findings on diagnostic imaging of breast: Secondary | ICD-10-CM

## 2015-12-22 ENCOUNTER — Ambulatory Visit (INDEPENDENT_AMBULATORY_CARE_PROVIDER_SITE_OTHER): Payer: 59 | Admitting: Gynecology

## 2015-12-22 ENCOUNTER — Encounter: Payer: Self-pay | Admitting: Gynecology

## 2015-12-22 VITALS — BP 124/82 | Ht 67.0 in | Wt 241.0 lb

## 2015-12-22 DIAGNOSIS — Z1329 Encounter for screening for other suspected endocrine disorder: Secondary | ICD-10-CM | POA: Diagnosis not present

## 2015-12-22 DIAGNOSIS — N912 Amenorrhea, unspecified: Secondary | ICD-10-CM

## 2015-12-22 DIAGNOSIS — Z1159 Encounter for screening for other viral diseases: Secondary | ICD-10-CM

## 2015-12-22 DIAGNOSIS — Z1321 Encounter for screening for nutritional disorder: Secondary | ICD-10-CM

## 2015-12-22 DIAGNOSIS — Z01419 Encounter for gynecological examination (general) (routine) without abnormal findings: Secondary | ICD-10-CM | POA: Diagnosis not present

## 2015-12-22 DIAGNOSIS — Z13220 Encounter for screening for lipoid disorders: Secondary | ICD-10-CM | POA: Diagnosis not present

## 2015-12-22 LAB — COMPREHENSIVE METABOLIC PANEL
ALBUMIN: 3.8 g/dL (ref 3.6–5.1)
ALK PHOS: 63 U/L (ref 33–130)
ALT: 18 U/L (ref 6–29)
AST: 20 U/L (ref 10–35)
BILIRUBIN TOTAL: 0.8 mg/dL (ref 0.2–1.2)
BUN: 12 mg/dL (ref 7–25)
CALCIUM: 9.2 mg/dL (ref 8.6–10.4)
CO2: 23 mmol/L (ref 20–31)
Chloride: 106 mmol/L (ref 98–110)
Creat: 0.88 mg/dL (ref 0.50–1.05)
Glucose, Bld: 92 mg/dL (ref 65–99)
POTASSIUM: 4 mmol/L (ref 3.5–5.3)
Sodium: 137 mmol/L (ref 135–146)
Total Protein: 6.8 g/dL (ref 6.1–8.1)

## 2015-12-22 LAB — CBC WITH DIFFERENTIAL/PLATELET
Basophils Absolute: 0 10*3/uL (ref 0.0–0.1)
Basophils Relative: 0 % (ref 0–1)
Eosinophils Absolute: 0.2 10*3/uL (ref 0.0–0.7)
Eosinophils Relative: 5 % (ref 0–5)
HEMATOCRIT: 42.3 % (ref 36.0–46.0)
HEMOGLOBIN: 13.9 g/dL (ref 12.0–15.0)
LYMPHS ABS: 1.2 10*3/uL (ref 0.7–4.0)
LYMPHS PCT: 36 % (ref 12–46)
MCH: 28.1 pg (ref 26.0–34.0)
MCHC: 32.9 g/dL (ref 30.0–36.0)
MCV: 85.6 fL (ref 78.0–100.0)
MONO ABS: 0.3 10*3/uL (ref 0.1–1.0)
MPV: 10.3 fL (ref 8.6–12.4)
Monocytes Relative: 9 % (ref 3–12)
NEUTROS ABS: 1.7 10*3/uL (ref 1.7–7.7)
Neutrophils Relative %: 50 % (ref 43–77)
Platelets: 244 10*3/uL (ref 150–400)
RBC: 4.94 MIL/uL (ref 3.87–5.11)
RDW: 13.8 % (ref 11.5–15.5)
WBC: 3.3 10*3/uL — AB (ref 4.0–10.5)

## 2015-12-22 LAB — TSH: TSH: 1.69 mIU/L

## 2015-12-22 LAB — LIPID PANEL
Cholesterol: 195 mg/dL (ref 125–200)
HDL: 42 mg/dL — AB (ref >=46)
LDL Cholesterol: 136 mg/dL — ABNORMAL HIGH (ref <130)
TRIGLYCERIDES: 84 mg/dL (ref <150)
Total CHOL/HDL Ratio: 4.6 Ratio (ref <=5.0)
VLDL: 17 mg/dL (ref <30)

## 2015-12-22 NOTE — Progress Notes (Signed)
Claire Sutton [birth year] [id number]        55 y.o.  [id number]  for annual exam.  Doing well.  Past medical history,surgical history, problem list, medications, allergies, family history and social history were all reviewed and documented as reviewed in the EPIC chart.  ROS:  Performed with pertinent positives and negatives included in the history, assessment and plan.   Additional significant findings :  none   Exam: Claire Sutton assistant Filed Vitals:   12/22/15 0846  BP: 124/82  Height: 5\' 7"  (1.702 m)  Weight: 241 lb (109.317 kg)   General appearance:  Normal affect, orientation and appearance. Skin: Grossly normal HEENT: Without gross lesions.  No cervical or supraclavicular adenopathy. Thyroid normal.  Lungs:  Clear without wheezing, rales or rhonchi Cardiac: RR, without RMG Abdominal:  Soft, nontender, without masses, guarding, rebound, organomegaly or hernia Breasts:  Examined lying and sitting without masses, retractions, discharge or axillary adenopathy. Pelvic:  Ext/BUS/vagina normal  Cervix normal  Uterus anteverted, normal size, shape and contour, midline and mobile nontender   Adnexa without masses or tenderness    Anus and perineum normal   Rectovaginal normal sphincter tone without palpated masses or tenderness.    Assessment/Plan:  55 y.o. [id number] female for annual exam without menses, not sexually active.   1. Continued amenorrhea. History of endometrial ablation. No significant menopausal symptoms. Check baseline FSH. 2. Pap smear/HPV normal 2016. No Pap smear done today. History of LGSIL 2005, ASCUS negative high risk HPV 2007. Normal Pap smears since then. 3. Mammography 08/2015. Continue with annual mammography when due. SBE monthly reviewed. 4. Colonoscopy 2014. Repeat at their recommended interval. 5. Health maintenance. Baseline CBC, comprehensive metabolic panel, lipid profile, urinalysis, TSH, vitamin D and hepatitis C screening per CDC  recommendations. Patient in the process of establishing a new primary care physician.  Follow up in one year, sooner as needed.   Claire Auerbach MD, 9:08 AM 12/22/2015

## 2015-12-22 NOTE — Patient Instructions (Signed)

## 2015-12-23 ENCOUNTER — Other Ambulatory Visit: Payer: Self-pay | Admitting: *Deleted

## 2015-12-23 DIAGNOSIS — R7989 Other specified abnormal findings of blood chemistry: Secondary | ICD-10-CM

## 2015-12-23 LAB — VITAMIN D 25 HYDROXY (VIT D DEFICIENCY, FRACTURES): VIT D 25 HYDROXY: 29 ng/mL — AB (ref 30–100)

## 2015-12-23 LAB — URINALYSIS W MICROSCOPIC + REFLEX CULTURE
BILIRUBIN URINE: NEGATIVE
CASTS: NONE SEEN [LPF]
CRYSTALS: NONE SEEN [HPF]
Glucose, UA: NEGATIVE
HGB URINE DIPSTICK: NEGATIVE
KETONES UR: NEGATIVE
Leukocytes, UA: NEGATIVE
NITRITE: NEGATIVE
PH: 6 (ref 5.0–8.0)
Protein, ur: NEGATIVE
RBC / HPF: NONE SEEN RBC/HPF (ref <=2)
Specific Gravity, Urine: 1.019 (ref 1.001–1.035)
WBC, UA: NONE SEEN WBC/HPF (ref <=5)
Yeast: NONE SEEN [HPF]

## 2015-12-23 LAB — HEPATITIS C ANTIBODY: HCV Ab: NEGATIVE

## 2015-12-23 LAB — FOLLICLE STIMULATING HORMONE: FSH: 8.7 m[IU]/mL

## 2015-12-24 ENCOUNTER — Other Ambulatory Visit: Payer: Self-pay | Admitting: Gynecology

## 2015-12-24 LAB — URINE CULTURE: Colony Count: 85000

## 2015-12-24 MED ORDER — SULFAMETHOXAZOLE-TRIMETHOPRIM 800-160 MG PO TABS: 1.0000 | ORAL_TABLET | Freq: Two times a day (BID) | ORAL | Status: DC

## 2016-02-17 ENCOUNTER — Other Ambulatory Visit: Payer: 59

## 2016-02-17 DIAGNOSIS — R7989 Other specified abnormal findings of blood chemistry: Secondary | ICD-10-CM

## 2016-02-17 LAB — CBC
HEMATOCRIT: 41.2 % (ref 35.0–45.0)
HEMOGLOBIN: 13.6 g/dL (ref 11.7–15.5)
MCH: 28.7 pg (ref 27.0–33.0)
MCHC: 33 g/dL (ref 32.0–36.0)
MCV: 86.9 fL (ref 80.0–100.0)
MPV: 10.6 fL (ref 7.5–12.5)
Platelets: 218 10*3/uL (ref 140–400)
RBC: 4.74 MIL/uL (ref 3.80–5.10)
RDW: 14.1 % (ref 11.0–15.0)
WBC: 4 10*3/uL (ref 3.8–10.8)

## 2016-08-07 ENCOUNTER — Other Ambulatory Visit: Payer: Self-pay | Admitting: Gynecology

## 2016-08-07 DIAGNOSIS — Z1231 Encounter for screening mammogram for malignant neoplasm of breast: Secondary | ICD-10-CM

## 2016-08-09 ENCOUNTER — Ambulatory Visit (HOSPITAL_BASED_OUTPATIENT_CLINIC_OR_DEPARTMENT_OTHER)
Admission: RE | Admit: 2016-08-09 | Discharge: 2016-08-09 | Disposition: A | Discharge status: Home / Self Care | Payer: 59 | Source: Ambulatory Visit | Attending: Gynecology | Admitting: Gynecology

## 2016-08-09 DIAGNOSIS — Z1231 Encounter for screening mammogram for malignant neoplasm of breast: Secondary | ICD-10-CM | POA: Insufficient documentation

## 2016-09-07 ENCOUNTER — Ambulatory Visit (INDEPENDENT_AMBULATORY_CARE_PROVIDER_SITE_OTHER): Payer: 59 | Admitting: Nurse Practitioner

## 2016-09-07 ENCOUNTER — Ambulatory Visit (INDEPENDENT_AMBULATORY_CARE_PROVIDER_SITE_OTHER)
Admission: RE | Admit: 2016-09-07 | Discharge: 2016-09-07 | Disposition: A | Discharge status: Home / Self Care | Payer: 59 | Source: Ambulatory Visit | Attending: Nurse Practitioner | Admitting: Nurse Practitioner

## 2016-09-07 ENCOUNTER — Encounter: Payer: Self-pay | Admitting: *Deleted

## 2016-09-07 ENCOUNTER — Other Ambulatory Visit: Payer: Self-pay | Admitting: *Deleted

## 2016-09-07 ENCOUNTER — Encounter: Payer: Self-pay | Admitting: Nurse Practitioner

## 2016-09-07 VITALS — BP 142/92 | HR 86 | Temp 97.6°F | Ht 67.0 in | Wt 241.0 lb

## 2016-09-07 DIAGNOSIS — J209 Acute bronchitis, unspecified: Secondary | ICD-10-CM | POA: Insufficient documentation

## 2016-09-07 DIAGNOSIS — R0609 Other forms of dyspnea: Secondary | ICD-10-CM

## 2016-09-07 DIAGNOSIS — K219 Gastro-esophageal reflux disease without esophagitis: Secondary | ICD-10-CM | POA: Insufficient documentation

## 2016-09-07 DIAGNOSIS — R9431 Abnormal electrocardiogram [ECG] [EKG]: Secondary | ICD-10-CM | POA: Diagnosis not present

## 2016-09-07 DIAGNOSIS — Z0001 Encounter for general adult medical examination with abnormal findings: Secondary | ICD-10-CM | POA: Diagnosis not present

## 2016-09-07 DIAGNOSIS — R030 Elevated blood-pressure reading, without diagnosis of hypertension: Secondary | ICD-10-CM | POA: Diagnosis not present

## 2016-09-07 MED ORDER — BUDESONIDE 90 MCG/ACT IN AEPB: 1.0000 | INHALATION_SPRAY | Freq: Two times a day (BID) | RESPIRATORY_TRACT | 3 refills | Status: DC

## 2016-09-07 MED ORDER — ALBUTEROL SULFATE HFA 108 (90 BASE) MCG/ACT IN AERS: 2.0000 | INHALATION_SPRAY | Freq: Four times a day (QID) | RESPIRATORY_TRACT | 0 refills | Status: DC | PRN

## 2016-09-07 MED ORDER — MONTELUKAST SODIUM 10 MG PO TABS: 10.0000 mg | ORAL_TABLET | Freq: Every day | ORAL | 3 refills | Status: DC

## 2016-09-07 NOTE — Progress Notes (Unsigned)
Pre visit review using our clinic review tool, if applicable. No additional management support is needed unless otherwise documented below in the visit note. 

## 2016-09-07 NOTE — Progress Notes (Signed)
Pre visit review using our clinic review tool, if applicable. No additional management support is needed unless otherwise documented below in the visit note. 

## 2016-09-07 NOTE — Progress Notes (Signed)
Patient ID: Claire Sutton, female    DOB: August 14, 1961, 55 y.o.   MRN: [id number]  Patient presents today for establish care (new patient) and DOE  Shortness of Breath  This is a new problem. The current episode started 1 to 4 weeks ago. The problem occurs intermittently. The problem has been waxing and waning. Pertinent negatives include no chest pain, claudication, fever, headaches, leg swelling, orthopnea, rash, rhinorrhea, sore throat, sputum production, swollen glands, syncope, vomiting or wheezing. The symptoms are aggravated by any activity. The patient has no known risk factors for DVT/PE. She has tried nothing for the symptoms. Her past medical history is significant for allergies and asthma. There is no history of aspirin allergies.    Immunizations: (TDAP, Hep C screen, Pneumovax, Influenza, zoster)  Health Maintenance  Topic Date Due  . Tetanus Vaccine  01/27/1979  . Flu Shot  02/03/2017*  . HIV Screening  09/06/2017*  . Pap Smear  11/13/2017  . Mammogram  08/09/2018  . Colon Cancer Screening  06/03/2023  .  Hepatitis C: One time screening is recommended by Center for Disease Control  (CDC) for  adults born from [birth year] through 1966.   Completed  *Topic was postponed. The date shown is not the original due date.   Diet:none Weight:  Wt Readings from Last 3 Encounters:  09/07/16 241 lb (109.3 kg)  12/22/15 241 lb (109.3 kg)  11/13/14 235 lb (106.6 kg)   Exercise:walking Fall Risk: Fall Risk  09/07/2016  Falls in the past year? No   Home Safety:home  Depression/Suicide: Depression screen [facility] 2/9 09/07/2016  Decreased Interest 0  Down, Depressed, Hopeless 0  PHQ - 2 Score 0   No flowsheet data found. Colonoscopy (every 5-33yrs, >50-55yrs):up to date Pap Smear (every 54yrs for >21-29 without HPV, every 63yrs for >30-74yrs with HPV):up to date Mammogram (yearly, >14yrs):up to date Vision:up to date Dental:up to date Advanced Directive: Advanced Directives 09/07/2016  Does  patient have an advance directive? No  Would patient like information on creating an advanced directive? No - patient declined information   Sexual History (birth control, marital status, STD): not sexually active, single  Medications and allergies reviewed with patient and updated if appropriate.  Patient Active Problem List   Diagnosis Date Noted  . Intermediate uveitis of left eye 06/16/2014  . Scleritis and episcleritis of left eye 06/16/2014    Current Outpatient Prescriptions on File Prior to Visit  Medication Sig Dispense Refill  . Cetirizine HCl (ZYRTEC PO) Take by mouth.    . Multiple Vitamins-Calcium (ONE-A-DAY WOMENS PO) Take by mouth.     No current facility-administered medications on file prior to visit.     Past Medical History:  Diagnosis Date  . Allergy   . ASCUS (atypical squamous cells of undetermined significance) on Pap smear 07/2006   NEG HIGH RISK HPV-- THEN IN 10/2006 AND 07/2007 PAPS WITH BENIGN REACTIVE CHANGES  . Asthma   . Headache   . Hypertension   . LGSIL (low grade squamous intraepithelial dysplasia) 10/05   PAP ON 02/2005, 7,8,9,10,11,12WNL  . Seizures ([place])     Past Surgical History:  Procedure Laterality Date  . COLPOSCOPY    . ENDOMETRIAL ABLATION  02/2007   NOVASURE/MARSUPILIZATION OF BARTHOLIN  . MARSUPILIZATION OF BARTHOLIN  02/2007   AT TIME OF [place] ABLATION SURGERY    Social History   Social History  . Marital status: Single    Spouse name: N/A  . Number of children: N/A  .  Years of education: N/A   Social History Main Topics  . Smoking status: Never Smoker  . Smokeless tobacco: Never Used  . Alcohol use No  . Drug use: No  . Sexual activity: Not Currently    Birth control/ protection: None     Comment: 1st intercourse 55 yo-More than 5 partners   Other Topics Concern  . None   Social History Narrative  . None    Family History  Problem Relation Age of Onset  . Hypertension Mother   . Hyperlipidemia  Mother   . Diabetes Father   . Hypertension Sister   . Diabetes Paternal Grandfather    Review of Systems  Constitutional: Negative for chills, diaphoresis, fever, malaise/fatigue and weight loss.  HENT: Negative for congestion and sore throat.   Eyes:       Negative for visual changes  Respiratory: Positive for shortness of breath. Negative for cough and sputum production.        Hx os asthma, no medication use at this time  Cardiovascular: Positive for palpitations and orthopnea. Negative for chest pain, leg swelling and PND.       Intermittent palpaitations  Gastrointestinal: Negative for blood in stool, constipation, diarrhea and heartburn.  Genitourinary: Negative for dysuria, frequency and urgency.  Musculoskeletal: Negative for falls, joint pain and myalgias.  Skin: Negative for rash.  Neurological: Negative for dizziness, sensory change, weakness and headaches.  Endo/Heme/Allergies: Positive for environmental allergies. Does not bruise/bleed easily.  Psychiatric/Behavioral: Negative for depression, substance abuse and suicidal ideas. The patient is not nervous/anxious.     Objective:   Vitals:   09/07/16 1502  BP: (!) 142/92  Pulse: 86  Temp: 97.6 F (36.4 C)    Body mass index is 37.75 kg/m. Filed Weights   09/07/16 1502  Weight: 241 lb (109.3 kg)    Physical Examination:  Physical Exam  Constitutional: She is oriented to person, place, and time and well-developed, well-nourished, and in no distress. No distress.  HENT:  Right Ear: External ear normal.  Left Ear: External ear normal.  Nose: Nose normal.  Mouth/Throat: Oropharynx is clear and moist. No oropharyngeal exudate.  Eyes: Conjunctivae and EOM are normal. Pupils are equal, round, and reactive to light. No scleral icterus.  Neck: Normal range of motion. Neck supple. No thyromegaly present.  Cardiovascular: Normal rate, normal heart sounds and intact distal pulses.   Pulmonary/Chest: Effort normal  and breath sounds normal. She exhibits no tenderness.  Abdominal: Soft. Bowel sounds are normal. She exhibits no distension. There is no tenderness.  Musculoskeletal: Normal range of motion. She exhibits no edema or tenderness.  Lymphadenopathy:    She has no cervical adenopathy.  Neurological: She is alert and oriented to person, place, and time. Gait normal.  Skin: Skin is warm and dry.  Psychiatric: Affect and judgment normal.  Vitals reviewed.  CXR indicates bronchitis vs asthma. ECG indicates NSR with non specific Twave abnormality. No previous ECG to compare.  Icesis was seen today for annual exam.  Diagnoses and all orders for this visit:  Encounter for preventative adult health care exam with abnormal findings  Dyspnea on exertion -     DG Chest 2 View; Future -     EKG 12-Lead -     albuterol (PROVENTIL HFA;VENTOLIN HFA) 108 (90 Base) MCG/ACT inhaler; Inhale 2 puffs into the lungs every 6 (six) hours as needed for wheezing or shortness of breath. -     Ambulatory referral to Cardiology  Elevated  BP without diagnosis of hypertension -     Ambulatory referral to Cardiology  Nonspecific abnormal electrocardiogram (ECG) (EKG) -     Ambulatory referral to Cardiology  Acute bronchitis, unspecified organism -     montelukast (SINGULAIR) 10 MG tablet; Take 1 tablet (10 mg total) by mouth at bedtime. -     Budesonide (PULMICORT FLEXHALER) 90 MCG/ACT inhaler; Inhale 1 puff into the lungs 2 (two) times daily.  Follow up in 45month for bronchitis and elevated BP

## 2016-09-07 NOTE — Patient Instructions (Addendum)
CXR indicates bronchitis vs asthma. ECG indicates NSR with non specific Twave abnormality. No previous ECG to compare.  Calorie Counting for Weight Loss Calories are energy you get from the things you eat and drink. Your body uses this energy to keep you going throughout the day. The number of calories you eat affects your weight. When you eat more calories than your body needs, your body stores the extra calories as fat. When you eat fewer calories than your body needs, your body burns fat to get the energy it needs. Calorie counting means keeping track of how many calories you eat and drink each day. If you make sure to eat fewer calories than your body needs, you should lose weight. In order for calorie counting to work, you will need to eat the number of calories that are right for you in a day to lose a healthy amount of weight per week. A healthy amount of weight to lose per week is usually 1-2 lb (0.5-0.9 kg). A dietitian can determine how many calories you need in a day and give you suggestions on how to reach your calorie goal.  WHAT IS MY MY PLAN? My goal is to have __________ calories per day.  If I have this many calories per day, I should lose around __________ pounds per week. WHAT DO I NEED TO KNOW ABOUT CALORIE COUNTING? In order to meet your daily calorie goal, you will need to:  Find out how many calories are in each food you would like to eat. Try to do this before you eat.  Decide how much of the food you can eat.  Write down what you ate and how many calories it had. Doing this is called keeping a food log. WHERE DO I FIND CALORIE INFORMATION? The number of calories in a food can be found on a Nutrition Facts label. Note that all the information on a label is based on a specific serving of the food. If a food does not have a Nutrition Facts label, try to look up the calories online or ask your dietitian for help. HOW DO I DECIDE HOW MUCH TO EAT? To decide how much of the food  you can eat, you will need to consider both the number of calories in one serving and the size of one serving. This information can be found on the Nutrition Facts label. If a food does not have a Nutrition Facts label, look up the information online or ask your dietitian for help. Remember that calories are listed per serving. If you choose to have more than one serving of a food, you will have to multiply the calories per serving by the amount of servings you plan to eat. For example, the label on a package of bread might say that a serving size is 1 slice and that there are 90 calories in a serving. If you eat 1 slice, you will have eaten 90 calories. If you eat 2 slices, you will have eaten 180 calories. HOW DO I KEEP A FOOD LOG? After each meal, record the following information in your food log:  What you ate.  How much of it you ate.  How many calories it had.  Then, add up your calories. Keep your food log near you, such as in a small notebook in your pocket. Another option is to use a mobile app or website. Some programs will calculate calories for you and show you how many calories you have left each time  you add an item to the log. WHAT ARE SOME CALORIE COUNTING TIPS?  Use your calories on foods and drinks that will fill you up and not leave you hungry. Some examples of this include foods like nuts and nut butters, vegetables, lean proteins, and high-fiber foods (more than 5 g fiber per serving).  Eat nutritious foods and avoid empty calories. Empty calories are calories you get from foods or beverages that do not have many nutrients, such as candy and soda. It is better to have a nutritious high-calorie food (such as an avocado) than a food with few nutrients (such as a bag of chips).  Know how many calories are in the foods you eat most often. This way, you do not have to look up how many calories they have each time you eat them.  Look out for foods that may seem like low-calorie  foods but are really high-calorie foods, such as baked goods, soda, and fat-free candy.  Pay attention to calories in drinks. Drinks such as sodas, specialty coffee drinks, alcohol, and juices have a lot of calories yet do not fill you up. Choose low-calorie drinks like water and diet drinks.  Focus your calorie counting efforts on higher calorie items. Logging the calories in a garden salad that contains only vegetables is less important than calculating the calories in a milk shake.  Find a way of tracking calories that works for you. Get creative. Most people who are successful find ways to keep track of how much they eat in a day, even if they do not count every calorie. WHAT ARE SOME PORTION CONTROL TIPS?  Know how many calories are in a serving. This will help you know how many servings of a certain food you can have.  Use a measuring cup to measure serving sizes. This is helpful when you start out. With time, you will be able to estimate serving sizes for some foods.  Take some time to put servings of different foods on your favorite plates, bowls, and cups so you know what a serving looks like.  Try not to eat straight from a bag or box. Doing this can lead to overeating. Put the amount you would like to eat in a cup or on a plate to make sure you are eating the right portion.  Use smaller plates, glasses, and bowls to prevent overeating. This is a quick and easy way to practice portion control. If your plate is smaller, less food can fit on it.  Try not to multitask while eating, such as watching TV or using your computer. If it is time to eat, sit down at a table and enjoy your food. Doing this will help you to start recognizing when you are full. It will also make you more aware of what and how much you are eating. HOW CAN I CALORIE COUNT WHEN EATING OUT?  Ask for smaller portion sizes or child-sized portions.  Consider sharing an entree and sides instead of getting your own  entree.  If you get your own entree, eat only half. Ask for a box at the beginning of your meal and put the rest of your entree in it so you are not tempted to eat it.  Look for the calories on the menu. If calories are listed, choose the lower calorie options.  Choose dishes that include vegetables, fruits, whole grains, low-fat dairy products, and lean protein. Focusing on smart food choices from each of the 5 food groups can  help you stay on track at restaurants.  Choose items that are boiled, broiled, grilled, or steamed.  Choose water, milk, unsweetened iced tea, or other drinks without added sugars. If you want an alcoholic beverage, choose a lower calorie option. For example, a regular margarita can have up to 700 calories and a glass of wine has around 150.  Stay away from items that are buttered, battered, fried, or served with cream sauce. Items labeled "crispy" are usually fried, unless stated otherwise.  Ask for dressings, sauces, and syrups on the side. These are usually very high in calories, so do not eat much of them.  Watch out for salads. Many people think salads are a healthy option, but this is often not the case. Many salads come with bacon, fried chicken, lots of cheese, fried chips, and dressing. All of these items have a lot of calories. If you want a salad, choose a garden salad and ask for grilled meats or steak. Ask for the dressing on the side, or ask for olive oil and vinegar or lemon to use as dressing.  Estimate how many servings of a food you are given. For example, a serving of cooked rice is  cup or about the size of half a tennis ball or one cupcake wrapper. Knowing serving sizes will help you be aware of how much food you are eating at restaurants. The list below tells you how big or small some common portion sizes are based on everyday objects.  1 oz--4 stacked dice.  3 oz--1 deck of cards.  1 tsp--1 dice.  1 Tbsp-- a Ping-Pong ball.  2 Tbsp--1  Ping-Pong ball.   cup--1 tennis ball or 1 cupcake wrapper.  1 cup--1 baseball.   This information is not intended to replace advice given to you by your health care provider. Make sure you discuss any questions you have with your health care provider.   Document Released: 10/23/2005 Document Revised: 11/13/2014 Document Reviewed: 08/28/2013 Elsevier Interactive Patient Education [employer].

## 2016-09-08 ENCOUNTER — Telehealth: Payer: Self-pay

## 2016-09-08 ENCOUNTER — Other Ambulatory Visit: Payer: Self-pay | Admitting: Nurse Practitioner

## 2016-09-08 DIAGNOSIS — J209 Acute bronchitis, unspecified: Secondary | ICD-10-CM

## 2016-09-08 DIAGNOSIS — R0609 Other forms of dyspnea: Secondary | ICD-10-CM

## 2016-09-08 MED ORDER — BECLOMETHASONE DIPROPIONATE 40 MCG/ACT IN AERS: 1.0000 | INHALATION_SPRAY | Freq: Two times a day (BID) | RESPIRATORY_TRACT | 3 refills | Status: DC

## 2016-09-08 NOTE — Telephone Encounter (Signed)
error 

## 2016-09-08 NOTE — Progress Notes (Signed)
Switch pulmicort to Qvar due to insurance coverage.

## 2016-09-15 ENCOUNTER — Encounter: Payer: Self-pay | Admitting: Physician Assistant

## 2016-09-15 ENCOUNTER — Ambulatory Visit (INDEPENDENT_AMBULATORY_CARE_PROVIDER_SITE_OTHER): Payer: 59 | Admitting: Physician Assistant

## 2016-09-15 VITALS — BP 128/78 | HR 82 | Ht 67.0 in | Wt 242.0 lb

## 2016-09-15 DIAGNOSIS — R9431 Abnormal electrocardiogram [ECG] [EKG]: Secondary | ICD-10-CM | POA: Diagnosis not present

## 2016-09-15 DIAGNOSIS — E785 Hyperlipidemia, unspecified: Secondary | ICD-10-CM

## 2016-09-15 DIAGNOSIS — R0609 Other forms of dyspnea: Secondary | ICD-10-CM | POA: Diagnosis not present

## 2016-09-15 DIAGNOSIS — E669 Obesity, unspecified: Secondary | ICD-10-CM | POA: Insufficient documentation

## 2016-09-15 DIAGNOSIS — R0602 Shortness of breath: Secondary | ICD-10-CM

## 2016-09-15 DIAGNOSIS — R030 Elevated blood-pressure reading, without diagnosis of hypertension: Secondary | ICD-10-CM

## 2016-09-15 DIAGNOSIS — I10 Essential (primary) hypertension: Secondary | ICD-10-CM | POA: Insufficient documentation

## 2016-09-15 LAB — CBC
HCT: 42.8 % (ref 35.0–45.0)
HEMOGLOBIN: 14.1 g/dL (ref 11.7–15.5)
MCH: 28.7 pg (ref 27.0–33.0)
MCHC: 32.9 g/dL (ref 32.0–36.0)
MCV: 87.2 fL (ref 80.0–100.0)
MPV: 10.2 fL (ref 7.5–12.5)
Platelets: 259 10*3/uL (ref 140–400)
RBC: 4.91 MIL/uL (ref 3.80–5.10)
RDW: 13.6 % (ref 11.0–15.0)
WBC: 5.8 10*3/uL (ref 3.8–10.8)

## 2016-09-15 LAB — BASIC METABOLIC PANEL
BUN: 9 mg/dL (ref 7–25)
CALCIUM: 8.8 mg/dL (ref 8.6–10.4)
CO2: 23 mmol/L (ref 20–31)
Chloride: 105 mmol/L (ref 98–110)
Creat: 0.92 mg/dL (ref 0.50–1.05)
Glucose, Bld: 88 mg/dL (ref 65–99)
Potassium: 4 mmol/L (ref 3.5–5.3)
SODIUM: 139 mmol/L (ref 135–146)

## 2016-09-15 NOTE — Patient Instructions (Addendum)
Medication Instructions:  None ordered.  Labwork: TODAY:  BMET & CBC  Testing/Procedures: Your physician has requested that you have a stress echocardiogram. For further information please visit [email or web address]. Please follow instruction sheet as given.    Follow-Up: Your physician recommends that you schedule a follow-up appointment in: AT LEAST 2 DAYS AFTER THE STRESS ECHO IS DONE   Any Other Special Instructions Will Be Listed Below (If Applicable).  Exercise Stress Echocardiogram An exercise stress echocardiogram is a heart (cardiac) test used to check the function of your heart. This test may also be called an exercise stress echocardiography or stress echo. This stress test will check how well your heart muscle and valves are working and determine if your heart muscle is getting enough blood. You will exercise on a treadmill to naturally increase or stress the functioning of your heart.  An echocardiogram uses sound waves (ultrasound) to produce an image of your heart. If your heart does not work normally, it may indicate coronary artery disease with poor coronary blood supply. The coronary arteries are the arteries that bring blood and oxygen to your heart. LET [facility] CARE PROVIDER KNOW ABOUT:  Any allergies you have.  All medicines you are taking, including vitamins, herbs, eye drops, creams, and over-the-counter medicines.  Previous problems you or members of your family have had with the use of anesthetics.  Any blood disorders you have.  Previous surgeries you have had.  Medical conditions you have.  Possibility of pregnancy, if this applies. RISKS AND COMPLICATIONS Generally, this is a safe procedure. However, as with any procedure, complications can occur. Possible complications can include:  You develop pain or pressure in the following areas:  Chest.  Jaw or neck.  Between your shoulder blades.  Radiating down your left arm.  Dizziness or  lightheadedness.  Shortness of breath.  Increased or irregular heartbeat.  Nausea or vomiting.  Heart attack (rare). BEFORE THE PROCEDURE  Avoid all forms of caffeine for 24 hours before your test or as directed by your health care provider. This includes coffee, tea (even decaffeinated tea), caffeinated sodas, chocolate, cocoa, and certain pain medicines.  Follow your health care provider's instructions regarding eating and drinking before the test.  Take your medicines as directed at regular times with water unless instructed otherwise. Exceptions may include:  If you have diabetes, ask how you are to take your insulin or pills. It is common to adjust insulin dosing the morning of the test.  If you are taking beta-blocker medicines, it is important to talk to your health care provider about these medicines well before the date of your test. Taking beta-blocker medicines may interfere with the test. In some cases, these medicines need to be changed or stopped 24 hours or more before the test.  If you wear a nitroglycerin patch, it may need to be removed prior to the test. Ask your health care provider if the patch should be removed before the test.  If you use an inhaler for any breathing condition, bring it with you to the test.  If you are an outpatient, bring a snack so you can eat right after the stress phase of the test.  Do not smoke for 4 hours prior to the test or as directed by your health care provider.  Wear loose-fitting clothes and comfortable shoes for the test. This test involves walking on a treadmill. PROCEDURE   Multiple electrodes will be put on your chest. If needed, small areas of your  chest may be shaved to get better contact with the electrodes. Once the electrodes are attached to your body, multiple wires will be attached to the electrodes, and your heart rate will be monitored.  You will have an echocardiogram done at rest.  To produce this image of your  heart, gel is applied to your chest, and a wand-like tool (transducer) is moved over the chest. The transducer sends the sound waves through the chest to create the moving images of your heart.  You may need an IV to receive a medication that improves the quality of the pictures.  You will then walk on a treadmill. The treadmill will be started at a slow pace. The treadmill speed and incline will gradually be increased to raise your heart rate.  At the peak of exercise, the treadmill will be stopped. You will lie down immediately on a bed so that a second echocardiogram can be done to visualize your heart's motion with exercise.  The test usually takes 30-60 minutes to complete. AFTER THE PROCEDURE  Your heart rate and blood pressure will be monitored after the test.  You may return to your normal schedule, including diet, activities, and medicines, unless your health care provider tells you otherwise.   This information is not intended to replace advice given to you by your health care provider. Make sure you discuss any questions you have with your health care provider.   Document Released: 10/27/2004 Document Revised: 10/28/2013 Document Reviewed: 06/30/2013 Elsevier Interactive Patient Education [employer].    If you need a refill on your cardiac medications before your next appointment, please call your pharmacy.

## 2016-09-15 NOTE — Progress Notes (Addendum)
Cardiology Office Note    Date:  09/15/2016  ID:  Claire Sutton, DOB 1961-06-21, MRN [id number] PCP:  Wilfred Lacy, NP  Cardiologist: New, reviewed with Dr. Saunders Revel    Chief Complaint: SOB  History of Present Illness:  Claire Sutton is a 55 y.o. female with history of recently elevated blood pressure reading, asthma, childhood seizures (following a fall age 39 with resultant blood clot in the brain), acid reflux, mild dyslipidemia 12/2015 who presents for evaluation of shortness of breath and abnormal EKG.   She has a long history of asthma-type symptoms, exacerbated by environmental features (such as tobacco, perfume, lotions). This stems back to exposure to generator gas at her workplace around 2008. At one point she was on prednisone, benadryl, and Zyrtec. In recent years she has been maintained on Zyrtec with good control. However, over the last 2.5 months she has noted shortness of breath with exertion. She will get SOB after going up 2 flights of stairs, requiring her to rest. She says she can do 5 more flights afterwards, just has to pace herself. She has not had any chest pain. She has a rare fleeting flutter sensation that she says feels like gas, but no sustained palpitations or relationship to the dyspnea. No LEE, orthopnea, weight changes. She saw her PCP reporting this recent progression of sx. CXR c/w bronchitis, prompting tx with abx. 12 lead EKG showed nonspecific changes thus she was advised to f/u cardiology. Her blood pressure was 142/92 at that visit, but is not typically elevated. Last labs 12/2015 with normal TSH, CMET, LDL 136, normal CBC 02/2016.  She says she can definitely tell worsening of her SOB if she happens to miss her Zyrtec. She also notices she has to close her car window if she's in the drive-thru and the person ahead of her is smoking out their window. She herself does not smoke. Her mother was told she has had a few MIs that she was unaware of, also has a valve  issue.   Past Medical History:  Diagnosis Date  . Allergy   . ASCUS (atypical squamous cells of undetermined significance) on Pap smear 07/2006   NEG HIGH RISK HPV-- THEN IN 10/2006 AND 07/2007 PAPS WITH BENIGN REACTIVE CHANGES  . Asthma   . Headache   . Hypertension   . LGSIL (low grade squamous intraepithelial dysplasia) 10/05   PAP ON 02/2005, 7,8,9,10,11,12WNL  . Seizures ([place])     Past Surgical History:  Procedure Laterality Date  . COLPOSCOPY    . ENDOMETRIAL ABLATION  02/2007   NOVASURE/MARSUPILIZATION OF BARTHOLIN  . MARSUPILIZATION OF BARTHOLIN  02/2007   AT TIME OF NOVASURE ABLATION SURGERY    Current Medications: Current Outpatient Prescriptions  Medication Sig Dispense Refill  . albuterol (PROVENTIL HFA;VENTOLIN HFA) 108 (90 Base) MCG/ACT inhaler Inhale 2 puffs into the lungs every 6 (six) hours as needed for wheezing or shortness of breath. 1 Inhaler 0  . beclomethasone (QVAR) 40 MCG/ACT inhaler Inhale 1 puff into the lungs 2 times daily at 12 noon and 4 pm. Rinse mouth after each use 1 Inhaler 3  . Cetirizine HCl (ZYRTEC PO) Take by mouth.    . Cholecalciferol (VITAMIN D PO) Take by mouth.    . montelukast (SINGULAIR) 10 MG tablet Take 1 tablet (10 mg total) by mouth at bedtime. 30 tablet 3  . Multiple Vitamins-Calcium (ONE-A-DAY WOMENS PO) Take by mouth.     No current facility-administered medications for this visit.  Allergies:   Prednisone and Codeine   Social History   Social History  . Marital status: Single    Spouse name: N/A  . Number of children: N/A  . Years of education: N/A   Social History Main Topics  . Smoking status: Never Smoker  . Smokeless tobacco: Never Used  . Alcohol use No  . Drug use: No  . Sexual activity: Not Currently    Birth control/ protection: None     Comment: 1st intercourse 55 yo-More than 5 partners   Other Topics Concern  . None   Social History Narrative  . None     Family History:  The patient's  family history includes Diabetes in her father and paternal grandfather; Heart disease in her mother; Hyperlipidemia in her mother; Hypertension in her mother and sister.   ROS:   Please see the history of present illness. All other systems are reviewed and otherwise negative.    PHYSICAL EXAM:   VS:  BP 128/78 (BP Location: Right Arm, Patient Position: Sitting, Cuff Size: Large)   Pulse 82   Ht 5\' 7"  (1.702 m)   Wt 242 lb (109.8 kg)   BMI 37.90 kg/m   BMI: Body mass index is 37.9 kg/m. GEN: Well nourished, well developed obese AAF, in no acute distress  HEENT: normocephalic, atraumatic Neck: no JVD, carotid bruits, or masses Cardiac: RRR; no murmurs, rubs, or gallops, no edema  Respiratory:  clear to auscultation bilaterally, normal work of breathing GI: soft, nontender, nondistended, + BS MS: no deformity or atrophy  Skin: warm and dry, no rash Neuro:  Alert and Oriented x 3, Strength and sensation are intact, follows commands Psych: euthymic mood, full affect  Wt Readings from Last 3 Encounters:  09/15/16 242 lb (109.8 kg)  09/07/16 241 lb (109.3 kg)  12/22/15 241 lb (109.3 kg)      Studies/Labs Reviewed:   EKG:  EKG was ordered today and personally reviewed by me and demonstrates NSR 83bpm, possible left atrial enlargement, TWI III, avL, abnormal TW morphology V5-V6  Recent Labs: 12/22/2015: ALT 18; BUN 12; Creat 0.88; Potassium 4.0; Sodium 137; TSH 1.69 02/17/2016: Hemoglobin 13.6; Platelets 218   Lipid Panel    Component Value Date/Time   CHOL 195 12/22/2015 0911   TRIG 84 12/22/2015 0911   HDL 42 (L) 12/22/2015 0911   CHOLHDL 4.6 12/22/2015 0911   VLDL 17 12/22/2015 0911   LDLCALC 136 (H) 12/22/2015 0911    Additional studies/ records that were reviewed today include: Summarized above.    ASSESSMENT & PLAN:   The patient's case was reviewed with Dr. Saunders Revel since this is a new patient to clinic. The plan below was formulated per our  discussion.  1. Dyspnea on exertion - per d/w MD, given abnormal EKG, obesity, mild dyslipidemia will proceed with stress echo to risk stratify. If this is unremarkable, would recommend she f/u pulmonology to optimize her asthma regimen. Will also obtain CBC and BMET to rule out obvious cause for sx. 2. Abnormal EKG - as above. Nonspecific changes. 3. Elevated blood pressure - the remainder of her readings have been normal. Would continue to monitor for now. Will see what her blood pressure does during the stress echo. 4. Mild dyslipidemia 12/2015 - being followed by PCP with lifestyle changes for now. If her stress testing is suggestive of CAD, would recheck and consider initiation of statin.  Disposition: F/u with me after the stress echo.   Medication Adjustments/Labs and Tests  Ordered: Current medicines are reviewed at length with the patient today.  Concerns regarding medicines are outlined above. Medication changes, Labs and Tests ordered today are summarized above and listed in the Patient Instructions accessible in Encounters.   Raechel Ache PA-C  09/15/2016 2:52 PM    [facility] [place], [place], [place]  [address] Phone: [phone]; Fax: [phone]

## 2016-10-12 ENCOUNTER — Encounter: Payer: Self-pay | Admitting: Nurse Practitioner

## 2016-10-12 ENCOUNTER — Ambulatory Visit (INDEPENDENT_AMBULATORY_CARE_PROVIDER_SITE_OTHER): Payer: 59 | Admitting: Nurse Practitioner

## 2016-10-12 VITALS — BP 120/80 | HR 58 | Temp 97.6°F | Ht 66.0 in | Wt 239.0 lb

## 2016-10-12 DIAGNOSIS — R030 Elevated blood-pressure reading, without diagnosis of hypertension: Secondary | ICD-10-CM | POA: Diagnosis not present

## 2016-10-12 NOTE — Patient Instructions (Signed)
Encourage weight loss through diet and exercise.

## 2016-10-12 NOTE — Progress Notes (Signed)
Pre visit review using our clinic review tool, if applicable. No additional management support is needed unless otherwise documented below in the visit note. 

## 2016-10-12 NOTE — Progress Notes (Signed)
Subjective:  Patient ID: Claire Sutton, female    DOB: December 26, 1960  Age: 55 y.o. MRN: [id number]  CC: Follow-up (1 month fu/prednison allergy?)   HPI Elevated Bp: Claire Sutton realized that leveated BP was caused by caeffeine consuption and she has stopped. She denies any CP or edema. She still has persistent SOB and palpitation with bending over. Nor exertional SON of chest pain or dia[phoresis. She has stress echo scheduled.  Outpatient Medications Prior to Visit  Medication Sig Dispense Refill  . albuterol (PROVENTIL HFA;VENTOLIN HFA) 108 (90 Base) MCG/ACT inhaler Inhale 2 puffs into the lungs every 6 (six) hours as needed for wheezing or shortness of breath. 1 Inhaler 0  . beclomethasone (QVAR) 40 MCG/ACT inhaler Inhale 1 puff into the lungs 2 times daily at 12 noon and 4 pm. Rinse mouth after each use 1 Inhaler 3  . Cetirizine HCl (ZYRTEC PO) Take by mouth.    . Cholecalciferol (VITAMIN D PO) Take by mouth.    . montelukast (SINGULAIR) 10 MG tablet Take 1 tablet (10 mg total) by mouth at bedtime. 30 tablet 3  . Multiple Vitamins-Calcium (ONE-A-DAY WOMENS PO) Take by mouth.     No facility-administered medications prior to visit.     ROS See HPI  Objective:  BP 120/80   Pulse (!) 58   Temp 97.6 F (36.4 C)   Ht 5\' 6"  (1.676 m)   Wt 239 lb (108.4 kg)   SpO2 99%   BMI 38.58 kg/m   BP Readings from Last 3 Encounters:  10/12/16 120/80  09/15/16 128/78  09/07/16 (!) 142/92    Wt Readings from Last 3 Encounters:  10/12/16 239 lb (108.4 kg)  09/15/16 242 lb (109.8 kg)  09/07/16 241 lb (109.3 kg)    Physical Exam  Constitutional: She is oriented to person, place, and time. No distress.  HENT:  Right Ear: External ear normal.  Left Ear: External ear normal.  Nose: Nose normal.  Mouth/Throat: No oropharyngeal exudate.  Eyes: No scleral icterus.  Neck: Normal range of motion. Neck supple.  Cardiovascular: Normal rate, regular rhythm and normal heart sounds.     Pulmonary/Chest: Effort normal and breath sounds normal. No respiratory distress.  Abdominal: Soft. She exhibits no distension.  Musculoskeletal: Normal range of motion. She exhibits no edema.  Lymphadenopathy:    She has no cervical adenopathy.  Neurological: She is alert and oriented to person, place, and time.  Skin: Skin is warm and dry.  Psychiatric: She has a normal mood and affect. Her behavior is normal.    Lab Results  Component Value Date   WBC 5.8 09/15/2016   HGB 14.1 09/15/2016   HCT 42.8 09/15/2016   PLT 259 09/15/2016   GLUCOSE 88 09/15/2016   CHOL 195 12/22/2015   TRIG 84 12/22/2015   HDL 42 (L) 12/22/2015   LDLCALC 136 (H) 12/22/2015   ALT 18 12/22/2015   AST 20 12/22/2015   NA 139 09/15/2016   K 4.0 09/15/2016   CL 105 09/15/2016   CREATININE 0.92 09/15/2016   BUN 9 09/15/2016   CO2 23 09/15/2016   TSH 1.69 12/22/2015    Dg Chest 2 View  Result Date: 09/07/2016 CLINICAL DATA:  Dyspnea on exertion for 2 months, history asthma, hypertension EXAM: CHEST  2 VIEW COMPARISON:  12/27/2007 FINDINGS: Normal heart size, mediastinal contours, and pulmonary vascularity. Mild central peribronchial thickening. Lungs otherwise clear. No pleural effusion or pneumothorax. Bones unremarkable. IMPRESSION: Central peribronchial thickening which may  reflect bronchitis or asthma. No acute infiltrate. Electronically Signed   By: Lavonia Dana M.D.   On: 09/07/2016 17:03    Assessment & Plan:   Claire Sutton was seen today for follow-up.  Diagnoses and all orders for this visit:  Elevated BP without diagnosis of hypertension   I am having Claire Sutton maintain her Multiple Vitamins-Calcium (ONE-A-DAY WOMENS PO), Cetirizine HCl (ZYRTEC PO), Cholecalciferol (VITAMIN D PO), albuterol, montelukast, and beclomethasone.  No orders of the defined types were placed in this encounter.   Follow-up: Return in about 6 months (around 04/12/2017) for BP and asthma.  Wilfred Lacy, NP

## 2016-10-13 ENCOUNTER — Telehealth (HOSPITAL_COMMUNITY): Payer: Self-pay | Admitting: *Deleted

## 2016-10-13 NOTE — Telephone Encounter (Signed)
Patient given detailed instructions per Stress Test Requisition Sheet for test on 10/16/16 at 7:30.Patient Notified to arrive 30 minutes early, and that it is imperative to arrive on time for appointment to keep from having the test rescheduled.  Patient verbalized understanding. Veronia Beets

## 2016-10-16 ENCOUNTER — Ambulatory Visit (HOSPITAL_COMMUNITY): Payer: 59 | Attending: Cardiology

## 2016-10-16 ENCOUNTER — Ambulatory Visit (HOSPITAL_COMMUNITY): Payer: 59

## 2016-10-16 ENCOUNTER — Encounter (HOSPITAL_COMMUNITY): Payer: Self-pay

## 2016-10-16 DIAGNOSIS — R9431 Abnormal electrocardiogram [ECG] [EKG]: Secondary | ICD-10-CM | POA: Diagnosis not present

## 2016-10-16 DIAGNOSIS — R0602 Shortness of breath: Secondary | ICD-10-CM | POA: Diagnosis present

## 2016-10-18 ENCOUNTER — Ambulatory Visit (INDEPENDENT_AMBULATORY_CARE_PROVIDER_SITE_OTHER): Payer: 59 | Admitting: Physician Assistant

## 2016-10-18 ENCOUNTER — Encounter: Payer: Self-pay | Admitting: Physician Assistant

## 2016-10-18 VITALS — BP 148/82 | HR 68 | Ht 66.0 in | Wt 241.0 lb

## 2016-10-18 DIAGNOSIS — E669 Obesity, unspecified: Secondary | ICD-10-CM

## 2016-10-18 DIAGNOSIS — R030 Elevated blood-pressure reading, without diagnosis of hypertension: Secondary | ICD-10-CM | POA: Diagnosis not present

## 2016-10-18 DIAGNOSIS — R0602 Shortness of breath: Secondary | ICD-10-CM | POA: Diagnosis not present

## 2016-10-18 DIAGNOSIS — R9431 Abnormal electrocardiogram [ECG] [EKG]: Secondary | ICD-10-CM | POA: Insufficient documentation

## 2016-10-18 DIAGNOSIS — E785 Hyperlipidemia, unspecified: Secondary | ICD-10-CM | POA: Insufficient documentation

## 2016-10-18 NOTE — Patient Instructions (Addendum)
Your physician recommends that you continue on your current medications as directed. Please refer to the Current Medication list given to you today.    Your physician recommends that you schedule a follow-up appointment in:  AS NEEDED 

## 2016-10-18 NOTE — Progress Notes (Signed)
Cardiology Office Note    Date:  10/18/2016  ID:  [name], DOB [birth year], MRN [id number] PCP:  Wilfred Lacy, NP  Cardiologist:  Reviewed with Dr. Saunders Revel last visit   Chief Complaint: f/u shortness of breath  History of Present Illness:  Claire Sutton is a 55 y.o. female with history of recently elevated blood pressure reading, asthma, childhood seizures (following a fall age 62 with resultant blood clot in the brain), acid reflux, mild dyslipidemia 12/2015 (LDL 136) who presents for f/u of SOB.    She has a long history of asthma-type symptoms, exacerbated by environmental features (such as tobacco, perfume, lotions). This stems back to exposure to generator gas at her workplace around 2008. At one point she was on prednisone, benadryl, and Zyrtec. In recent years she has been maintained on Zyrtec with good control. She recently saw her PCP 09/2016 reporting SOB x 2.5 months. Her blood pressure was 142/92 at that visit, but is not typically elevated. CXR c/w bronchitis, prompting tx with abx. 12 lead EKG showed nonspecific changes thus she was advised to f/u cardiology. BMET, CBC unremarkable. The patient was reviewed with Dr. Saunders Revel. We recommended stress echo was negative for ischemia with normal LV function. Max BP was 193/85 in recovery.  She presents back for follow-up today overall feeling better. Her dyspnea on exertion has improved on its own. She no longer gets SOB doing normal activities like showering/bathing. She also can go up steps more normally than before. She still has mild dyspnea with exertion but it no longer sounds out of proportion to the activity. No chest pain. Blood pressure at PCP office on 10/12/16 was 120/80. She states she was previously able to manage high BP in the past by adjusting caffeine intake and losing weight, and feels motivated to do the same again.   Past Medical History:  Diagnosis Date  . Allergy   . ASCUS (atypical squamous cells of  undetermined significance) on Pap smear 07/2006   NEG HIGH RISK HPV-- THEN IN 10/2006 AND 07/2007 PAPS WITH BENIGN REACTIVE CHANGES  . Asthma   . GERD (gastroesophageal reflux disease)   . Headache   . History of blood clot in brain    a. Fall age 62 - was told she had a blood clot in the brain from this.  [place] LGSIL (low grade squamous intraepithelial dysplasia) 10/05   PAP ON 02/2005, 7,8,9,10,11,12WNL  . Obesity   . Seizures ([place])    a. began after she had a fall at age 9, last seizure age 30.    Past Surgical History:  Procedure Laterality Date  . COLPOSCOPY    . ENDOMETRIAL ABLATION  02/2007   NOVASURE/MARSUPILIZATION OF BARTHOLIN  . MARSUPILIZATION OF BARTHOLIN  02/2007   AT TIME OF NOVASURE ABLATION SURGERY    Current Medications: Current Outpatient Prescriptions  Medication Sig Dispense Refill  . albuterol (PROVENTIL HFA;VENTOLIN HFA) 108 (90 Base) MCG/ACT inhaler Inhale 2 puffs into the lungs every 6 (six) hours as needed for wheezing or shortness of breath. 1 Inhaler 0  . beclomethasone (QVAR) 40 MCG/ACT inhaler Inhale 1 puff into the lungs 2 times daily at 12 noon and 4 pm. Rinse mouth after each use 1 Inhaler 3  . Cetirizine HCl (ZYRTEC PO) Take 1 tablet by mouth daily.     . Cholecalciferol (VITAMIN D PO) Take 1 tablet by mouth daily.     . montelukast (SINGULAIR) 10 MG tablet Take 1 tablet (10 mg total)  by mouth at bedtime. 30 tablet 3  . Multiple Vitamins-Calcium (ONE-A-DAY WOMENS PO) Take 1 tablet by mouth daily.      No current facility-administered medications for this visit.      Allergies:   Codeine   Social History   Social History  . Marital status: Single    Spouse name: N/A  . Number of children: N/A  . Years of education: N/A   Social History Main Topics  . Smoking status: Never Smoker  . Smokeless tobacco: Never Used  . Alcohol use No  . Drug use: No  . Sexual activity: Not Currently    Birth control/ protection: None     Comment: 1st  intercourse 55 yo-More than 5 partners   Other Topics Concern  . None   Social History Narrative  . None     Family History:  The patient's family history includes Diabetes in her father and paternal grandfather; Heart disease in her mother; Hyperlipidemia in her mother; Hypertension in her mother and sister.   ROS:   Please see the history of present illness. All other systems are reviewed and otherwise negative.    PHYSICAL EXAM:   VS:  BP (!) 148/82   Pulse 68   Ht 5\' 6"  (1.676 m)   Wt 241 lb (109.3 kg)   SpO2 97%   BMI 38.90 kg/m   BMI: Body mass index is 38.9 kg/m. GEN: Well nourished, well developed AAF, in no acute distress  HEENT: normocephalic, atraumatic Neck: no JVD, carotid bruits, or masses Cardiac: RRR; no murmurs, rubs, or gallops, no edema  Respiratory:  clear to auscultation bilaterally, normal work of breathing GI: soft, nontender, nondistended, + BS MS: no deformity or atrophy  Skin: warm and dry, no rash Neuro:  Alert and Oriented x 3, Strength and sensation are intact, follows commands Psych: euthymic mood, full affect  Wt Readings from Last 3 Encounters:  10/18/16 241 lb (109.3 kg)  10/12/16 239 lb (108.4 kg)  09/15/16 242 lb (109.8 kg)      Studies/Labs Reviewed:   EKG: EKG was not ordered today  Recent Labs: 12/22/2015: ALT 18; TSH 1.69 09/15/2016: BUN 9; Creat 0.92; Hemoglobin 14.1; Platelets 259; Potassium 4.0; Sodium 139   Lipid Panel    Component Value Date/Time   CHOL 195 12/22/2015 0911   TRIG 84 12/22/2015 0911   HDL 42 (L) 12/22/2015 0911   CHOLHDL 4.6 12/22/2015 0911   VLDL 17 12/22/2015 0911   LDLCALC 136 (H) 12/22/2015 0911    Additional studies/ records that were reviewed today include: Summarized above.    ASSESSMENT & PLAN:   1. Dyspnea on exertion - improved, may have been due to bronchitis. Stress echo was normal. She plans to f/u with her PCP to further discuss her pulmonary regimen. 2. Abnormal EKG - as  above, negative stress test. Continue risk factor modification. 3. Elevated blood pressure without diagnosis of HTN - blood pressure continues to be borderline. Given recent AHA/ACC guidelines I told her I thought she would benefit from initiation of antihypertensive to reduce the blood pressure spikes above goal. She states she was previously able to manage high BP in the past by adjusting caffeine intake and losing weight, and feels motivated to do the same again rather than start medication at this time. I recommended she follow up in 1-2 months to further review her progress. She wishes to do so with primary care so that she can review her pulmonary regimen with them  at that time. 4. Obesity - we reviewed importance of remaining active. She says she feels like she knows that weight loss will help. She snores intermittently but denies any excessive daytime fatigue.  Disposition: F/u with cardiology PRN.  Medication Adjustments/Labs and Tests Ordered: Current medicines are reviewed at length with the patient today.  Concerns regarding medicines are outlined above. Medication changes, Labs and Tests ordered today are summarized above and listed in the Patient Instructions accessible in Encounters.   Raechel Ache PA-C  10/18/2016 3:46 PM    [facility] [place], [place], [place]  [address] Phone: [phone]; Fax: [phone]

## 2016-12-26 ENCOUNTER — Ambulatory Visit (INDEPENDENT_AMBULATORY_CARE_PROVIDER_SITE_OTHER): Payer: 59 | Admitting: Gynecology

## 2016-12-26 ENCOUNTER — Encounter: Payer: Self-pay | Admitting: Gynecology

## 2016-12-26 VITALS — BP 124/80 | Ht 66.0 in | Wt 241.0 lb

## 2016-12-26 DIAGNOSIS — N952 Postmenopausal atrophic vaginitis: Secondary | ICD-10-CM | POA: Diagnosis not present

## 2016-12-26 DIAGNOSIS — Z01411 Encounter for gynecological examination (general) (routine) with abnormal findings: Secondary | ICD-10-CM | POA: Diagnosis not present

## 2016-12-26 NOTE — Patient Instructions (Signed)

## 2016-12-26 NOTE — Progress Notes (Signed)
    Claire Sutton [birth year] [id number]        56 y.o.  [id number] for annual exam.    Past medical history,surgical history, problem list, medications, allergies, family history and social history were all reviewed and documented as reviewed in the EPIC chart.  ROS:  Performed with pertinent positives and negatives included in the history, assessment and plan.   Additional significant findings :  None   Exam: Copywriter, advertising Vitals:   12/26/16 0815  BP: 124/80  Weight: 241 lb (109.3 kg)  Height: 5\' 6"  (1.676 m)   Body mass index is 38.9 kg/m.  General appearance:  Normal affect, orientation and appearance. Skin: Grossly normal HEENT: Without gross lesions.  No cervical or supraclavicular adenopathy. Thyroid normal.  Lungs:  Clear without wheezing, rales or rhonchi Cardiac: RR, without RMG Abdominal:  Soft, nontender, without masses, guarding, rebound, organomegaly or hernia Breasts:  Examined lying and sitting without masses, retractions, discharge or axillary adenopathy. Pelvic:  Ext, BUS, Vagina with mild atrophic changes  Cervix with mild atrophic changes  Uterus anteverted, normal size, shape and contour, midline and mobile nontender   Adnexa without masses or tenderness    Anus and perineum normal   Rectovaginal normal sphincter tone without palpated masses or tenderness.    Assessment/Plan:  55 y.o. [id number] female for annual exam.   1. Continues amenorrheic. History of endometrial ablation. Without menopausal symptoms. [place] normal last year. Continue to monitor and report any bleeding. 2. Pap smear/HPV 2016. No Pap smear done today. History of LGSIL 2005. ASCUS negative high-risk HPV 2007. Normal Paps afterwards. Plan Pap smear at 5 year interval per current screening guidelines. 3. Mammography October 2017. Continue with annual mammography when due. SBE monthly reviewed. 4. Colonoscopy 2014. Repeat at their recommended interval. 5. DEXA never. Plan further into the  menopause. 6. Health maintenance. No routine lab work done as patient reports this done at her primary physician's office. Follow up 1 year, sooner as needed.   Anastasio Auerbach MD, 8:37 AM 12/26/2016

## 2016-12-27 ENCOUNTER — Encounter: Payer: 59 | Admitting: Gynecology

## 2016-12-29 ENCOUNTER — Encounter: Payer: 59 | Admitting: Gynecology

## 2017-03-14 ENCOUNTER — Other Ambulatory Visit: Payer: Self-pay | Admitting: Internal Medicine

## 2017-03-14 ENCOUNTER — Telehealth: Payer: Self-pay | Admitting: Nurse Practitioner

## 2017-03-14 DIAGNOSIS — J45909 Unspecified asthma, uncomplicated: Secondary | ICD-10-CM | POA: Insufficient documentation

## 2017-03-14 DIAGNOSIS — J4520 Mild intermittent asthma, uncomplicated: Secondary | ICD-10-CM

## 2017-03-14 MED ORDER — BECLOMETHASONE DIPROP HFA 40 MCG/ACT IN AERB: 1.0000 | INHALATION_SPRAY | Freq: Two times a day (BID) | RESPIRATORY_TRACT | 5 refills | Status: DC

## 2017-03-14 NOTE — Telephone Encounter (Signed)
Completed.

## 2017-03-14 NOTE — Telephone Encounter (Signed)
[facility] pharmacy called stating pt request refill for Qvar inhaler (pt is out of this med). The manufacture discontinue Qvar but they have Qvar Redihaler instead. Please advise in Claire Sutton's absence.

## 2017-04-18 ENCOUNTER — Other Ambulatory Visit: Payer: 59

## 2017-04-18 ENCOUNTER — Ambulatory Visit (INDEPENDENT_AMBULATORY_CARE_PROVIDER_SITE_OTHER): Payer: 59 | Admitting: Nurse Practitioner

## 2017-04-18 ENCOUNTER — Encounter: Payer: Self-pay | Admitting: Nurse Practitioner

## 2017-04-18 VITALS — BP 120/82 | HR 72 | Temp 98.2°F | Ht 66.0 in | Wt 244.0 lb

## 2017-04-18 DIAGNOSIS — Z23 Encounter for immunization: Secondary | ICD-10-CM | POA: Diagnosis not present

## 2017-04-18 DIAGNOSIS — E559 Vitamin D deficiency, unspecified: Secondary | ICD-10-CM | POA: Insufficient documentation

## 2017-04-18 DIAGNOSIS — J209 Acute bronchitis, unspecified: Secondary | ICD-10-CM

## 2017-04-18 DIAGNOSIS — J4520 Mild intermittent asthma, uncomplicated: Secondary | ICD-10-CM

## 2017-04-18 MED ORDER — BECLOMETHASONE DIPROP HFA 40 MCG/ACT IN AERB: 1.0000 | INHALATION_SPRAY | Freq: Every day | RESPIRATORY_TRACT | 0 refills | Status: DC

## 2017-04-18 MED ORDER — MONTELUKAST SODIUM 10 MG PO TABS: 10.0000 mg | ORAL_TABLET | Freq: Every day | ORAL | 1 refills | Status: DC

## 2017-04-18 NOTE — Progress Notes (Signed)
Subjective:  Patient ID: Claire Sutton, female    DOB: 15-Jan-1961  Age: 56 y.o. MRN: [id number]  CC: Follow-up (6 mo fu/asthma flear up)   HPI Asthma: Stable with use of Qvar, singulair and albuterol. Improved SOB and cough.  Outpatient Medications Prior to Visit  Medication Sig Dispense Refill  . Cetirizine HCl (ZYRTEC PO) Take 1 tablet by mouth daily.     . Cholecalciferol (VITAMIN D PO) Take 1 tablet by mouth daily.     . Multiple Vitamins-Calcium (ONE-A-DAY WOMENS PO) Take 1 tablet by mouth daily.     . Beclomethasone Diprop HFA (QVAR REDIHALER) 40 MCG/ACT AERB Inhale 1 puff into the lungs 2 (two) times daily. 10.6 g 5  . montelukast (SINGULAIR) 10 MG tablet Take 1 tablet (10 mg total) by mouth at bedtime. 30 tablet 3  . albuterol (PROVENTIL HFA;VENTOLIN HFA) 108 (90 Base) MCG/ACT inhaler Inhale 2 puffs into the lungs every 6 (six) hours as needed for wheezing or shortness of breath. (Patient not taking: Reported on 04/18/2017) 1 Inhaler 0   No facility-administered medications prior to visit.     ROS Review of Systems  Constitutional: Negative.   Respiratory: Negative for cough, shortness of breath and wheezing.   Cardiovascular: Negative for chest pain and leg swelling.  Gastrointestinal: Negative.   Genitourinary: Negative.   Musculoskeletal: Negative.   Psychiatric/Behavioral: Negative.     Objective:  BP 120/82   Pulse 72   Temp 98.2 F (36.8 C)   Ht 5\' 6"  (1.676 m)   Wt 244 lb (110.7 kg)   SpO2 96%   BMI 39.38 kg/m   BP Readings from Last 3 Encounters:  04/18/17 120/82  12/26/16 124/80  10/18/16 (!) 148/82    Wt Readings from Last 3 Encounters:  04/18/17 244 lb (110.7 kg)  12/26/16 241 lb (109.3 kg)  10/18/16 241 lb (109.3 kg)    Physical Exam  Constitutional: She is oriented to person, place, and time. No distress.  HENT:  Right Ear: External ear normal.  Left Ear: External ear normal.  Nose: Nose normal.  Mouth/Throat: No oropharyngeal  exudate.  Eyes: No scleral icterus.  Neck: Normal range of motion. Neck supple.  Cardiovascular: Normal rate, regular rhythm and normal heart sounds.   Pulmonary/Chest: Effort normal and breath sounds normal. No respiratory distress.  Abdominal: Soft. She exhibits no distension.  Musculoskeletal: Normal range of motion. She exhibits no edema.  Lymphadenopathy:    She has no cervical adenopathy.  Neurological: She is alert and oriented to person, place, and time.  Skin: Skin is warm and dry.  Psychiatric: She has a normal mood and affect. Her behavior is normal.  Vitals reviewed.   Lab Results  Component Value Date   WBC 5.8 09/15/2016   HGB 14.1 09/15/2016   HCT 42.8 09/15/2016   PLT 259 09/15/2016   GLUCOSE 88 09/15/2016   CHOL 195 12/22/2015   TRIG 84 12/22/2015   HDL 42 (L) 12/22/2015   LDLCALC 136 (H) 12/22/2015   ALT 18 12/22/2015   AST 20 12/22/2015   NA 139 09/15/2016   K 4.0 09/15/2016   CL 105 09/15/2016   CREATININE 0.92 09/15/2016   BUN 9 09/15/2016   CO2 23 09/15/2016   TSH 1.69 12/22/2015    Dg Chest 2 View  Result Date: 09/07/2016 CLINICAL DATA:  Dyspnea on exertion for 2 months, history asthma, hypertension EXAM: CHEST  2 VIEW COMPARISON:  12/27/2007 FINDINGS: Normal heart size, mediastinal contours, and  pulmonary vascularity. Mild central peribronchial thickening. Lungs otherwise clear. No pleural effusion or pneumothorax. Bones unremarkable. IMPRESSION: Central peribronchial thickening which may reflect bronchitis or asthma. No acute infiltrate. Electronically Signed   By: Lavonia Dana M.D.   On: 09/07/2016 17:03    Assessment & Plan:   [place] was seen today for follow-up.  Diagnoses and all orders for this visit:  Mild intermittent asthma without complication -     beclomethasone (QVAR REDIHALER) 40 MCG/ACT inhaler; Inhale 1 puff into the lungs daily.  Vitamin D insufficiency -     Vitamin D 1,25 dihydroxy; Future  Acute bronchitis, unspecified  organism -     montelukast (SINGULAIR) 10 MG tablet; Take 1 tablet (10 mg total) by mouth at bedtime.  Need for diphtheria-tetanus-pertussis (Tdap) vaccine -     Tdap vaccine greater than or equal to 7yo IM   I have discontinued Ms. Deats beclomethasone. I have also changed her beclomethasone. Additionally, I am having her maintain her Multiple Vitamins-Calcium (ONE-A-DAY WOMENS PO), Cetirizine HCl (ZYRTEC PO), Cholecalciferol (VITAMIN D PO), albuterol, and montelukast.  Meds ordered this encounter  Medications  . DISCONTD: beclomethasone (QVAR REDIHALER) 40 MCG/ACT inhaler    Sig: Inhale into the lungs.  . montelukast (SINGULAIR) 10 MG tablet    Sig: Take 1 tablet (10 mg total) by mouth at bedtime.    Dispense:  90 tablet    Refill:  1    Order Specific Question:   Supervising Provider    Answer:   Cassandria Anger [[id number]]  . beclomethasone (QVAR REDIHALER) 40 MCG/ACT inhaler    Sig: Inhale 1 puff into the lungs daily.    Dispense:  10.6 g    Refill:  0    Order Specific Question:   Supervising Provider    Answer:   Cassandria Anger [[id number]]    Follow-up: Return in about 6 months (around 10/18/2017) for cpe (FASTING).  Wilfred Lacy, NP

## 2017-04-18 NOTE — Patient Instructions (Signed)
USE QVAR ONCE A DAY X 2WEEKS, THEN STOP. IF NO ASTHMA FLARE, DISCONTINUE QVAR.  GO TO BASEMENT FOR BLOOD DRAW.

## 2017-04-22 LAB — VITAMIN D 1,25 DIHYDROXY
VITAMIN D 1, 25 (OH) TOTAL: 34 pg/mL
Vitamin D2 1, 25 (OH)2: <10 pg/mL
Vitamin D3 1, 25 (OH)2: 34 pg/mL

## 2017-05-28 ENCOUNTER — Other Ambulatory Visit: Payer: Self-pay | Admitting: Nurse Practitioner

## 2017-05-28 DIAGNOSIS — R0609 Other forms of dyspnea: Principal | ICD-10-CM

## 2017-07-31 ENCOUNTER — Other Ambulatory Visit: Payer: Self-pay | Admitting: Gynecology

## 2017-07-31 DIAGNOSIS — Z1231 Encounter for screening mammogram for malignant neoplasm of breast: Secondary | ICD-10-CM

## 2017-08-07 ENCOUNTER — Ambulatory Visit (HOSPITAL_BASED_OUTPATIENT_CLINIC_OR_DEPARTMENT_OTHER)
Admission: RE | Admit: 2017-08-07 | Discharge: 2017-08-07 | Disposition: A | Discharge status: Home / Self Care | Payer: 59 | Source: Ambulatory Visit | Attending: Gynecology | Admitting: Gynecology

## 2017-08-07 DIAGNOSIS — Z1231 Encounter for screening mammogram for malignant neoplasm of breast: Secondary | ICD-10-CM | POA: Insufficient documentation

## 2017-08-15 ENCOUNTER — Encounter: Payer: Self-pay | Admitting: Nurse Practitioner

## 2017-08-15 ENCOUNTER — Ambulatory Visit (INDEPENDENT_AMBULATORY_CARE_PROVIDER_SITE_OTHER): Payer: 59 | Admitting: Nurse Practitioner

## 2017-08-15 VITALS — BP 140/88 | HR 77 | Temp 97.7°F | Ht 66.0 in | Wt 246.0 lb

## 2017-08-15 DIAGNOSIS — J4521 Mild intermittent asthma with (acute) exacerbation: Secondary | ICD-10-CM

## 2017-08-15 MED ORDER — PREDNISONE 10 MG (21) PO TBPK: ORAL_TABLET | ORAL | 0 refills | Status: DC

## 2017-08-15 MED ORDER — AZITHROMYCIN 250 MG PO TABS: 250.0000 mg | ORAL_TABLET | Freq: Every day | ORAL | 0 refills | Status: DC

## 2017-08-15 MED ORDER — METHYLPREDNISOLONE ACETATE 40 MG/ML IJ SUSP
40.0000 mg | Freq: Once | INTRAMUSCULAR | Status: AC
  Administered 2017-08-15: 40 mg via INTRAMUSCULAR

## 2017-08-15 NOTE — Progress Notes (Signed)
Subjective:  Patient ID: Claire Sutton, female    DOB: [birth year]  Age: 56 y.o. MRN: [id number]  CC: Cough (coughing, congestion,wheezing at night---going on for 1 wk and half. feel like esophagus tight? she has it stretching before. )   Cough  This is a new problem. The current episode started 1 to 4 weeks ago. The problem has been waxing and waning. The problem occurs constantly. The cough is productive of sputum. Associated symptoms include shortness of breath and wheezing. Pertinent negatives include no chills, heartburn, myalgias, nasal congestion, postnasal drip, rhinorrhea or sore throat. The symptoms are aggravated by lying down. She has tried a beta-agonist inhaler and steroid inhaler for the symptoms. The treatment provided no relief. Her past medical history is significant for asthma and environmental allergies.   Throat clearing, wheezing x 2weeks.  Outpatient Medications Prior to Visit  Medication Sig Dispense Refill  . beclomethasone (QVAR REDIHALER) 40 MCG/ACT inhaler Inhale 1 puff into the lungs daily. 10.6 g 0  . Cetirizine HCl (ZYRTEC PO) Take 1 tablet by mouth daily.     . Cholecalciferol (VITAMIN D PO) Take 1 tablet by mouth daily.     . montelukast (SINGULAIR) 10 MG tablet Take 1 tablet (10 mg total) by mouth at bedtime. 90 tablet 1  . Multiple Vitamins-Calcium (ONE-A-DAY WOMENS PO) Take 1 tablet by mouth daily.     . VENTOLIN HFA 108 (90 Base) MCG/ACT inhaler inhale 2 puffs by mouth every 6 hours if needed for wheezing or shortness of breath 18 g 0   No facility-administered medications prior to visit.     ROS See HPI  Objective:  BP 140/88   Pulse 77   Temp 97.7 F (36.5 C)   Ht 5\' 6"  (1.676 m)   Wt 246 lb (111.6 kg)   SpO2 98%   BMI 39.71 kg/m   BP Readings from Last 3 Encounters:  08/15/17 140/88  04/18/17 120/82  12/26/16 124/80    Wt Readings from Last 3 Encounters:  08/15/17 246 lb (111.6 kg)  04/18/17 244 lb (110.7 kg)  12/26/16 241 lb  (109.3 kg)    Physical Exam  Constitutional: She is oriented to person, place, and time. No distress.  HENT:  Mouth/Throat: Oropharynx is clear and moist. No oropharyngeal exudate.  Cardiovascular: Normal rate and regular rhythm.   Pulmonary/Chest: She has wheezes. She has no rales.  Musculoskeletal: She exhibits no edema.  Neurological: She is alert and oriented to person, place, and time.  Vitals reviewed.   Lab Results  Component Value Date   WBC 5.8 09/15/2016   HGB 14.1 09/15/2016   HCT 42.8 09/15/2016   PLT 259 09/15/2016   GLUCOSE 88 09/15/2016   CHOL 195 12/22/2015   TRIG 84 12/22/2015   HDL 42 (L) 12/22/2015   LDLCALC 136 (H) 12/22/2015   ALT 18 12/22/2015   AST 20 12/22/2015   NA 139 09/15/2016   K 4.0 09/15/2016   CL 105 09/15/2016   CREATININE 0.92 09/15/2016   BUN 9 09/15/2016   CO2 23 09/15/2016   TSH 1.69 12/22/2015    Mm Screening Breast Tomo Bilateral  Result Date: 08/08/2017 CLINICAL DATA:  Screening. EXAM: 2D DIGITAL SCREENING BILATERAL MAMMOGRAM WITH CAD AND ADJUNCT TOMO COMPARISON:  Previous exam(s). ACR Breast Density Category b: There are scattered areas of fibroglandular density. FINDINGS: There are no findings suspicious for malignancy. Images were processed with CAD. IMPRESSION: No mammographic evidence of malignancy. A result letter of this screening  mammogram will be mailed directly to the patient. RECOMMENDATION: Screening mammogram in one year. (Code:[id number]-01Y) BI-RADS CATEGORY  1: Negative. Electronically Signed   By: Abelardo Diesel M.D.   On: 08/08/2017 11:36    Assessment & Plan:   Claire Sutton was seen today for cough.  Diagnoses and all orders for this visit:  Mild intermittent asthma with acute exacerbation -     methylPREDNISolone acetate (DEPO-MEDROL) injection 40 mg; Inject 1 mL (40 mg total) into the muscle once. -     predniSONE (STERAPRED UNI-PAK 21 TAB) 10 MG (21) TBPK tablet; Take by mouth as directed. -     azithromycin  (ZITHROMAX Z-PAK) 250 MG tablet; Take 1 tablet (250 mg total) by mouth daily. Take 2tabs on first day, then 1tab once a day till complete   I am having [name] start on predniSONE and azithromycin. I am also having her maintain her Multiple Vitamins-Calcium (ONE-A-DAY WOMENS PO), Cetirizine HCl (ZYRTEC PO), Cholecalciferol (VITAMIN D PO), montelukast, beclomethasone, and VENTOLIN HFA. We administered methylPREDNISolone acetate.  Meds ordered this encounter  Medications  . methylPREDNISolone acetate (DEPO-MEDROL) injection 40 mg  . predniSONE (STERAPRED UNI-PAK 21 TAB) 10 MG (21) TBPK tablet    Sig: Take by mouth as directed.    Dispense:  21 tablet    Refill:  0    Order Specific Question:   Supervising Provider    Answer:   Cassandria Anger [[id number]]  . azithromycin (ZITHROMAX Z-PAK) 250 MG tablet    Sig: Take 1 tablet (250 mg total) by mouth daily. Take 2tabs on first day, then 1tab once a day till complete    Dispense:  6 tablet    Refill:  0    Order Specific Question:   Supervising Provider    Answer:   Cassandria Anger [[id number]]    Follow-up: No Follow-up on file.  Wilfred Lacy, NP

## 2017-08-15 NOTE — Patient Instructions (Addendum)
Hold Qvar till symptoms improve, then use for 2weeks and stop.  Continue albuterol inhaler three times a day x 2day, then as needed for SOB or cough or wheezing.  Start oral prednisone tomorrow.  Start azithromycin today (with function).  Encourage adequate oral hydration.  Call office for CXR order if no improvement in 1week.

## 2017-08-22 ENCOUNTER — Ambulatory Visit (INDEPENDENT_AMBULATORY_CARE_PROVIDER_SITE_OTHER)
Admission: RE | Admit: 2017-08-22 | Discharge: 2017-08-22 | Disposition: A | Discharge status: Home / Self Care | Payer: 59 | Source: Ambulatory Visit | Attending: Nurse Practitioner | Admitting: Nurse Practitioner

## 2017-08-22 ENCOUNTER — Telehealth: Payer: Self-pay | Admitting: Emergency Medicine

## 2017-08-22 DIAGNOSIS — R05 Cough: Secondary | ICD-10-CM

## 2017-08-22 DIAGNOSIS — R0602 Shortness of breath: Secondary | ICD-10-CM | POA: Diagnosis not present

## 2017-08-22 DIAGNOSIS — R059 Cough, unspecified: Secondary | ICD-10-CM

## 2017-08-22 NOTE — Telephone Encounter (Signed)
Spoke with pt, she agree to come in for the x-ray. Order entered.

## 2017-08-22 NOTE — Telephone Encounter (Signed)
Pt called and stated she was seen last week for asthma issues. She is still having symptoms and would like to know if there is anything else she can do. Please give her a call back thanks.

## 2017-10-17 ENCOUNTER — Encounter: Payer: 59 | Admitting: Nurse Practitioner

## 2017-10-26 ENCOUNTER — Ambulatory Visit (INDEPENDENT_AMBULATORY_CARE_PROVIDER_SITE_OTHER): Payer: 59 | Admitting: Nurse Practitioner

## 2017-10-26 ENCOUNTER — Encounter: Payer: Self-pay | Admitting: Nurse Practitioner

## 2017-10-26 VITALS — BP 140/92 | HR 74 | Temp 98.0°F | Ht 66.0 in | Wt 246.0 lb

## 2017-10-26 DIAGNOSIS — R12 Heartburn: Secondary | ICD-10-CM

## 2017-10-26 DIAGNOSIS — F458 Other somatoform disorders: Secondary | ICD-10-CM

## 2017-10-26 DIAGNOSIS — Z0001 Encounter for general adult medical examination with abnormal findings: Secondary | ICD-10-CM

## 2017-10-26 DIAGNOSIS — E785 Hyperlipidemia, unspecified: Secondary | ICD-10-CM

## 2017-10-26 DIAGNOSIS — I10 Essential (primary) hypertension: Secondary | ICD-10-CM | POA: Diagnosis not present

## 2017-10-26 DIAGNOSIS — T781XXA Other adverse food reactions, not elsewhere classified, initial encounter: Secondary | ICD-10-CM | POA: Diagnosis not present

## 2017-10-26 MED ORDER — EPINEPHRINE 0.3 MG/0.3ML IJ SOAJ: 0.3000 mg | Freq: Once | INTRAMUSCULAR | 0 refills | Status: AC

## 2017-10-26 MED ORDER — OMEPRAZOLE 20 MG PO CPDR: 20.0000 mg | DELAYED_RELEASE_CAPSULE | Freq: Every day | ORAL | 0 refills | Status: DC

## 2017-10-26 NOTE — Patient Instructions (Addendum)
Stable labs except persistent elevation in cholesterol. I encourage regular exercise and heart healthy diet  Health Maintenance, Female Adopting a healthy lifestyle and getting preventive care can go a long way to promote health and wellness. Talk with your health care provider about what schedule of regular examinations is right for you. This is a good chance for you to check in with your provider about disease prevention and staying healthy. In between checkups, there are plenty of things you can do on your own. Experts have done a lot of research about which lifestyle changes and preventive measures are most likely to keep you healthy. Ask your health care provider for more information. Weight and diet Eat a healthy diet  Be sure to include plenty of vegetables, fruits, low-fat dairy products, and lean protein.  Do not eat a lot of foods high in solid fats, added sugars, or salt.  Get regular exercise. This is one of the most important things you can do for your health. ? Most adults should exercise for at least 150 minutes each week. The exercise should increase your heart rate and make you sweat (moderate-intensity exercise). ? Most adults should also do strengthening exercises at least twice a week. This is in addition to the moderate-intensity exercise.  Maintain a healthy weight  Body mass index (BMI) is a measurement that can be used to identify possible weight problems. It estimates body fat based on height and weight. Your health care provider can help determine your BMI and help you achieve or maintain a healthy weight.  For females 57 years of age and older: ? A BMI below 18.5 is considered underweight. ? A BMI of 18.5 to 24.9 is normal. ? A BMI of 25 to 29.9 is considered overweight. ? A BMI of 30 and above is considered obese.  Watch levels of cholesterol and blood lipids  You should start having your blood tested for lipids and cholesterol at 57 years of age, then have  this test every 5 years. blood tested for lipids and cholesterol at 57 years of age, then have  this test every 5 years.  You may need to have your cholesterol levels checked more often if: ? Your lipid or cholesterol levels are high. ? You are older than 57 years of age. ? You are at high risk for heart disease.  Cancer screening Lung Cancer  Lung cancer screening is recommended for adults 40-57 years old who are at high risk for lung cancer because of a history of smoking.  A yearly low-dose CT scan of the lungs is recommended for people who: ? Currently smoke. ? Have quit within the past 15 years. ? Have at least a 30-pack-year history of smoking. A pack year is smoking an average of one pack of cigarettes a day for 1 year.  Yearly screening should continue until it has been 15 years since you quit.  Yearly screening should stop if you develop a health problem that would prevent you from having lung cancer treatment.  Breast Cancer  Practice breast self-awareness. This means understanding how your breasts normally appear and feel.  It also means doing regular breast self-exams. Let your health care provider know about any changes, no matter how small.  If you are in your 20s or 30s, you should have a clinical breast exam (CBE) by a health care provider every 1-3 years as part of a regular health exam.  If you are 57 or older, have a CBE every year. every year. Also consider having a breast X-ray (mammogram) every year.  If you have a family history of breast cancer,  talk to your health care provider about genetic screening.  If you are at high risk for breast cancer, talk to your health care provider about having an MRI and a mammogram every year.  Breast cancer gene (BRCA) assessment is recommended for women who have family members with BRCA-related cancers. BRCA-related cancers include: ? Breast. ? Ovarian. ? Tubal. ? Peritoneal cancers.  Results of the assessment will determine the need for genetic counseling and BRCA1 and BRCA2 testing.  Cervical Cancer Your health care  provider may recommend that you be screened regularly for cancer of the pelvic organs (ovaries, uterus, and vagina). This screening involves a pelvic examination, including checking for microscopic changes to the surface of your cervix (Pap test). You may be encouraged to have this screening done every 3 years, beginning at age 57.  For women ages 57-65, health care providers may recommend pelvic exams and Pap testing every 3 years, or they may recommend the Pap and pelvic exam, combined with testing for human papilloma virus (HPV), every 5 years. Some types of HPV increase your risk of cervical cancer. Testing for HPV may also be done on women of any age with unclear Pap test results.  Other health care providers may not recommend any screening for nonpregnant women who are considered low risk for pelvic cancer and who do not have symptoms. Ask your health care provider if a screening pelvic exam is right for you.  If you have had past treatment for cervical cancer or a condition that could lead to cancer, you need Pap tests and screening for cancer for at least 20 years after your treatment. If Pap tests have been discontinued, your risk factors (such as having a new sexual partner) need to be reassessed to determine if screening should resume. Some women have medical problems that increase the chance of getting cervical cancer. In these cases, your health care provider may recommend more frequent screening and Pap tests.  Colorectal Cancer  This type of cancer can be detected and often prevented.  Routine colorectal cancer screening usually begins at 57 years of age and continues through 57 years of age.  Your health care provider may recommend screening at an earlier age if you have risk factors for colon cancer.  Your health care provider may also recommend using home test kits to check for hidden blood in the stool.  A small camera at the end of a tube can be used to examine your colon  directly (sigmoidoscopy or colonoscopy). This is done to check for the earliest forms of colorectal cancer.  Routine screening usually begins at age 57.  Direct examination of the colon should be repeated every 5-10 years through 57 years of age. However, you may need to be screened more often if early forms of precancerous polyps or small growths are found.  Skin Cancer  Check your skin from head to toe regularly.  Tell your health care provider about any new moles or changes in moles, especially if there is a change in a mole's shape or color.  Also tell your health care provider if you have a mole that is larger than the size of a pencil eraser.  Always use sunscreen. Apply sunscreen liberally and repeatedly throughout the day.  Protect yourself by wearing long sleeves, pants, a wide-brimmed hat, and sunglasses whenever you are outside.  Heart disease, diabetes, and high blood pressure  High blood pressure causes heart disease and increases the risk of stroke. High blood pressure is  more likely to develop in: ? People who have blood pressure in the high end of the normal range (130-139/85-89 mm Hg). ? People who are overweight or obese. ? People who are African American.  If you are 65-78 years of age, have your blood pressure checked every 3-5 years. If you are 24 years of age or older, have your blood pressure checked every year. You should have your blood pressure measured twice-once when you are at a hospital or clinic, and once when you are not at a hospital or clinic. Record the average of the two measurements. To check your blood pressure when you are not at a hospital or clinic, you can use: ? An automated blood pressure machine at a pharmacy. ? A home blood pressure monitor.  If you are between 34 years and 41 years old, ask your health care provider if you should take aspirin to prevent strokes.  Have regular diabetes screenings. This involves taking a blood sample to  check your fasting blood sugar level. ? If you are at a normal weight and have a low risk for diabetes, have this test once every three years after 57 years of age. ? If you are overweight and have a high risk for diabetes, consider being tested at a younger age or more often. Preventing infection Hepatitis B  If you have a higher risk for hepatitis B, you should be screened for this virus. You are considered at high risk for hepatitis B if: ? You were born in a country where hepatitis B is common. Ask your health care provider which countries are considered high risk. ? Your parents were born in a high-risk country, and you have not been immunized against hepatitis B (hepatitis B vaccine). ? You have HIV or AIDS. ? You use needles to inject street drugs. ? You live with someone who has hepatitis B. ? You have had sex with someone who has hepatitis B. ? You get hemodialysis treatment. ? You take certain medicines for conditions, including cancer, organ transplantation, and autoimmune conditions.  Hepatitis C  Blood testing is recommended for: ? Everyone born from [birth year] through 1966. ? Anyone with known risk factors for hepatitis C.  Sexually transmitted infections (STIs)  You should be screened for sexually transmitted infections (STIs) including gonorrhea and chlamydia if: ? You are sexually active and are younger than 57 years of age. ? You are older than 57 years of age and your health care provider tells you that you are at risk for this type of infection. ? Your sexual activity has changed since you were last screened and you are at an increased risk for chlamydia or gonorrhea. Ask your health care provider if you are at risk.  If you do not have HIV, but are at risk, it may be recommended that you take a prescription medicine daily to prevent HIV infection. This is called pre-exposure prophylaxis (PrEP). You are considered at risk if: ? You are sexually active and do not regularly  use condoms or know the HIV status of your partner(s). ? You take drugs by injection. ? You are sexually active with a partner who has HIV.  Talk with your health care provider about whether you are at high risk of being infected with HIV. If you choose to begin PrEP, you should first be tested for HIV. You should then be tested every 3 months for as long as you are taking PrEP. Pregnancy  If you are premenopausal and you  may become pregnant, ask your health care provider about preconception counseling.  If you may become pregnant, take 400 to 800 micrograms (mcg) of folic acid every day.  If you want to prevent pregnancy, talk to your health care provider about birth control (contraception). Osteoporosis and menopause  Osteoporosis is a disease in which the bones lose minerals and strength with aging. This can result in serious bone fractures. Your risk for osteoporosis can be identified using a bone density scan.  If you are 49 years of age or older, or if you are at risk for osteoporosis and fractures, ask your health care provider if you should be screened.  Ask your health care provider whether you should take a calcium or vitamin D supplement to lower your risk for osteoporosis.  Menopause may have certain physical symptoms and risks.  Hormone replacement therapy may reduce some of these symptoms and risks. Talk to your health care provider about whether hormone replacement therapy is right for you. Follow these instructions at home:  Schedule regular health, dental, and eye exams.  Stay current with your immunizations.  Do not use any tobacco products including cigarettes, chewing tobacco, or electronic cigarettes.  If you are pregnant, do not drink alcohol.  If you are breastfeeding, limit how much and how often you drink alcohol.  Limit alcohol intake to no more than 1 drink per day for nonpregnant women. One drink equals 12 ounces of beer, 5 ounces of wine, or 1 ounces  of hard liquor.  Do not use street drugs.  Do not share needles.  Ask your health care provider for help if you need support or information about quitting drugs.  Tell your health care provider if you often feel depressed.  Tell your health care provider if you have ever been abused or do not feel safe at home. This information is not intended to replace advice given to you by your health care provider. Make sure you discuss any questions you have with your health care provider. Document Released: 05/08/2011 Document Revised: 03/30/2016 Document Reviewed: 07/27/2015 [employer]vier Interactive Patient Education  [employer].

## 2017-10-26 NOTE — Progress Notes (Signed)
Subjective:    Patient ID: Claire Sutton, female    DOB: [birth year], 56 y.o.   MRN: [id number]  Patient presents today for complete physical   Allergic Reaction  This is a recurrent problem. The current episode started more than 1 week ago. The problem occurs intermittently. The problem has been waxing and waning since onset. The patient was exposed to food. The time of exposure was just prior to onset. Associated symptoms include coughing, difficulty breathing, globus sensation, trouble swallowing and wheezing. Pertinent negatives include no chest pain, diarrhea or rash. There is no swelling present. Treatments tried: albuterol. Her past medical history is significant for asthma and seasonal allergies.  she has noticed symptoms when she eats at certain restaurants like [employer]. She is requesting for food allergy testing.  HTN: stable BP Readings from Last 3 Encounters:  10/26/17 (!) 140/92  08/15/17 140/88  04/18/17 120/82   Immunizations: (TDAP, Hep C screen, Pneumovax, Influenza, zoster)  Health Maintenance  Topic Date Due  . Flu Shot  02/04/2018*  . HIV Screening  10/26/2018*  . Pap Smear  11/13/2017  . Mammogram  08/08/2019  . Colon Cancer Screening  06/03/2023  . Tetanus Vaccine  04/19/2027  .  Hepatitis C: One time screening is recommended by Center for Disease Control  (CDC) for  adults born from [birth year] through 1966.   Completed  *Topic was postponed. The date shown is not the original due date.   Diet:regular  Weight:  Wt Readings from Last 3 Encounters:  10/26/17 246 lb (111.6 kg)  08/15/17 246 lb (111.6 kg)  04/18/17 244 lb (110.7 kg)    Exercise:none.  Fall Risk: Fall Risk  08/15/2017 09/07/2016  Falls in the past year? Yes No  Number falls in past yr: 1 -  Injury with Fall? No -   Home Safety:home alone.  Depression/Suicide: Depression screen [facility] 2/9 08/15/2017 09/07/2016  Decreased Interest 0 0  Down, Depressed, Hopeless 0 0  PHQ - 2 Score  0 0   Vision:up to date.  Dental:up to date.  Advanced Directive: Advanced Directives 09/07/2016  Does Patient Have a Medical Advance Directive? No  Would patient like information on creating a medical advance directive? No - patient declined information   Sexual History (birth control, marital status, STD):single, not sexually active  Medications and allergies reviewed with patient and updated if appropriate.  Patient Active Problem List   Diagnosis Date Noted  . Asthma 03/14/2017  . Shortness of breath on exertion 10/18/2016  . Abnormal EKG 10/18/2016  . Elevated blood pressure reading without diagnosis of hypertension 10/18/2016  . Dyslipidemia 10/18/2016  . Essential hypertension 09/15/2016  . Obesity   . Gastroesophageal reflux disease 09/07/2016  . Acute bronchitis 09/07/2016  . Intermediate uveitis of left eye 06/16/2014  . Scleritis and episcleritis of left eye 06/16/2014    Current Outpatient Medications on File Prior to Visit  Medication Sig Dispense Refill  . beclomethasone (QVAR REDIHALER) 40 MCG/ACT inhaler Inhale 1 puff into the lungs daily. 10.6 g 0  . Cetirizine HCl (ZYRTEC PO) Take 1 tablet by mouth daily.     . Cholecalciferol (VITAMIN D PO) Take 1 tablet by mouth daily.     . montelukast (SINGULAIR) 10 MG tablet Take 1 tablet (10 mg total) by mouth at bedtime. 90 tablet 1  . Multiple Vitamins-Calcium (ONE-A-DAY WOMENS PO) Take 1 tablet by mouth daily.     . VENTOLIN HFA 108 (90 Base) MCG/ACT inhaler inhale 2 puffs  by mouth every 6 hours if needed for wheezing or shortness of breath 18 g 0   No current facility-administered medications on file prior to visit.     Past Medical History:  Diagnosis Date  . Allergy   . ASCUS (atypical squamous cells of undetermined significance) on Pap smear 07/2006   NEG HIGH RISK HPV-- THEN IN 10/2006 AND 07/2007 PAPS WITH BENIGN REACTIVE CHANGES  . Asthma   . GERD (gastroesophageal reflux disease)   . Headache   .  History of blood clot in brain    a. Fall age 59 - was told she had a blood clot in the brain from this.  Marland Kitchen LGSIL (low grade squamous intraepithelial dysplasia) 10/05   PAP ON 02/2005, 7,8,9,10,11,12WNL  . Obesity   . Seizures ([place])    a. began after she had a fall at age 17, last seizure age 28.    Past Surgical History:  Procedure Laterality Date  . COLPOSCOPY    . ENDOMETRIAL ABLATION  02/2007   NOVASURE/MARSUPILIZATION OF BARTHOLIN  . MARSUPILIZATION OF BARTHOLIN  02/2007   AT TIME OF [place] ABLATION SURGERY    Social History   Socioeconomic History  . Marital status: Single    Spouse name: None  . Number of children: None  . Years of education: None  . Highest education level: None  Social Needs  . Financial resource strain: None  . Food insecurity - worry: None  . Food insecurity - inability: None  . Transportation needs - medical: None  . Transportation needs - non-medical: None  Occupational History  . None  Tobacco Use  . Smoking status: Never Smoker  . Smokeless tobacco: Never Used  Substance and Sexual Activity  . Alcohol use: No    Alcohol/week: 0.0 oz  . Drug use: No  . Sexual activity: Not Currently    Birth control/protection: None    Comment: 1st intercourse 56 yo-More than 5 partners  Other Topics Concern  . None  Social History Narrative  . None    Family History  Problem Relation Age of Onset  . Hypertension Mother   . Hyperlipidemia Mother   . Heart disease Mother        was told she may have had a few MIs that she was unaware of  . Diabetes Father   . Hypertension Sister   . Diabetes Paternal Grandfather         Review of Systems  Constitutional: Negative for fever, malaise/fatigue and weight loss.  HENT: Positive for trouble swallowing. Negative for congestion and sore throat.   Eyes:       Negative for visual changes  Respiratory: Positive for cough and wheezing. Negative for shortness of breath.   Cardiovascular: Negative for  chest pain, palpitations and leg swelling.  Gastrointestinal: Negative for blood in stool, constipation, diarrhea and heartburn.  Genitourinary: Negative for dysuria, frequency and urgency.  Musculoskeletal: Negative for falls, joint pain and myalgias.  Skin: Negative for rash.  Neurological: Negative for dizziness, sensory change and headaches.  Endo/Heme/Allergies: Does not bruise/bleed easily.  Psychiatric/Behavioral: Negative for depression, substance abuse and suicidal ideas. The patient is not nervous/anxious.     Objective:   Vitals:   10/26/17 1527  BP: (!) 140/92  Pulse: 74  Temp: 98 F (36.7 C)  SpO2: 98%    Body mass index is 39.71 kg/m.   Physical Examination:  Physical Exam  Constitutional: She is oriented to person, place, and time and well-developed, well-nourished,  and in no distress. No distress.  HENT:  Right Ear: External ear normal.  Left Ear: External ear normal.  Nose: Nose normal.  Mouth/Throat: Oropharynx is clear and moist. No oropharyngeal exudate.  Eyes: Conjunctivae and EOM are normal. Pupils are equal, round, and reactive to light. No scleral icterus.  Neck: Normal range of motion. Neck supple. No thyromegaly present.  Cardiovascular: Normal rate, normal heart sounds and intact distal pulses.  Pulmonary/Chest: Effort normal and breath sounds normal. She exhibits no tenderness.  Abdominal: Soft. Bowel sounds are normal. She exhibits no distension. There is no tenderness.  Genitourinary:  Genitourinary Comments: Breast and pelvic exam deferred to GYN  Musculoskeletal: Normal range of motion. She exhibits no edema or tenderness.  Lymphadenopathy:    She has no cervical adenopathy.  Neurological: She is alert and oriented to person, place, and time. Gait normal.  Skin: Skin is warm and dry.  Psychiatric: Affect and judgment normal.    ASSESSMENT and PLAN:  Mescal was seen today for annual exam.  Diagnoses and all orders for this  visit:  Encounter for preventative adult health care exam with abnormal findings -     CBC with Differential/Platelet -     Comprehensive metabolic panel; Future -     TSH; Future  Globus hystericus -     EPINEPHrine (EPIPEN 2-PAK) 0.3 mg/0.3 mL IJ SOAJ injection; Inject 0.3 mLs (0.3 mg total) into the muscle once for 1 dose. -     Food Allergy Profile; Future -     omeprazole (PRILOSEC) 20 MG capsule; Take 1 capsule (20 mg total) by mouth daily.  Dyslipidemia -     Lipid panel; Future  Essential hypertension  Heart burn -     omeprazole (PRILOSEC) 20 MG capsule; Take 1 capsule (20 mg total) by mouth daily.  Allergic reaction to food, initial encounter   No problem-specific Assessment & Plan notes found for this encounter.    Recent Results (from the past 2160 hour(s))  Lipid panel     Status: Abnormal   Collection Time: 10/29/17 10:14 AM  Result Value Ref Range   Cholesterol 202 (H) 0 - 200 mg/dL    Comment: ATP III Classification       Desirable:  < 200 mg/dL               Borderline High:  200 - 239 mg/dL          High:  > = 240 mg/dL   Triglycerides 76.0 0.0 - 149.0 mg/dL    Comment: Normal:  <150 mg/dLBorderline High:  150 - 199 mg/dL   HDL 47.40 >39.00 mg/dL   VLDL 15.2 0.0 - 40.0 mg/dL   LDL Cholesterol 139 (H) 0 - 99 mg/dL   Total CHOL/HDL Ratio 4     Comment:                Men          Women1/2 Average Risk     3.4          3.3Average Risk          5.0          4.42X Average Risk          9.6          7.13X Average Risk          15.0          11.0  NonHDL 154.17     Comment: NOTE:  Non-HDL goal should be 30 mg/dL higher than patient's LDL goal (i.e. LDL goal of < 70 mg/dL, would have non-HDL goal of < 100 mg/dL)  TSH     Status: None   Collection Time: 10/29/17 10:14 AM  Result Value Ref Range   TSH 1.05 0.35 - 4.50 uIU/mL  Comprehensive metabolic panel     Status: None   Collection Time: 10/29/17 10:14 AM  Result Value Ref Range   Sodium  139 135 - 145 mEq/L   Potassium 4.2 3.5 - 5.1 mEq/L   Chloride 106 96 - 112 mEq/L   CO2 28 19 - 32 mEq/L   Glucose, Bld 93 70 - 99 mg/dL   BUN 12 6 - 23 mg/dL   Creatinine, Ser 0.92 0.40 - 1.20 mg/dL   Total Bilirubin 0.8 0.2 - 1.2 mg/dL   Alkaline Phosphatase 72 39 - 117 U/L   AST 18 0 - 37 U/L   ALT 24 0 - 35 U/L   Total Protein 6.8 6.0 - 8.3 g/dL   Albumin 3.9 3.5 - 5.2 g/dL   Calcium 9.1 8.4 - 10.5 mg/dL   GFR 80.70 >60.00 mL/min  CBC with Differential/Platelet     Status: None   Collection Time: 10/31/17  1:20 PM  Result Value Ref Range   WBC 4.0 4.0 - 10.5 K/uL   RBC 5.04 3.87 - 5.11 Mil/uL   Hemoglobin 14.6 12.0 - 15.0 g/dL   HCT 44.7 36.0 - 46.0 %   MCV 88.7 78.0 - 100.0 fl   MCHC 32.6 30.0 - 36.0 g/dL   RDW 14.2 11.5 - 15.5 %   Platelets 230.0 150.0 - 400.0 K/uL   Neutrophils Relative % 45.0 43.0 - 77.0 %   Lymphocytes Relative 42.4 12.0 - 46.0 %   Monocytes Relative 8.7 3.0 - 12.0 %   Eosinophils Relative 3.0 0.0 - 5.0 %   Basophils Relative 0.9 0.0 - 3.0 %   Neutro Abs 1.8 1.4 - 7.7 K/uL   Lymphs Abs 1.7 0.7 - 4.0 K/uL   Monocytes Absolute 0.3 0.1 - 1.0 K/uL   Eosinophils Absolute 0.1 0.0 - 0.7 K/uL   Basophils Absolute 0.0 0.0 - 0.1 K/uL   Follow up: Return if symptoms worsen or fail to improve.  Wilfred Lacy, NP

## 2017-10-29 ENCOUNTER — Other Ambulatory Visit (INDEPENDENT_AMBULATORY_CARE_PROVIDER_SITE_OTHER): Payer: 59

## 2017-10-29 DIAGNOSIS — F458 Other somatoform disorders: Secondary | ICD-10-CM

## 2017-10-29 DIAGNOSIS — E785 Hyperlipidemia, unspecified: Secondary | ICD-10-CM | POA: Diagnosis not present

## 2017-10-29 DIAGNOSIS — Z0001 Encounter for general adult medical examination with abnormal findings: Secondary | ICD-10-CM | POA: Diagnosis not present

## 2017-10-29 LAB — COMPREHENSIVE METABOLIC PANEL
ALBUMIN: 3.9 g/dL (ref 3.5–5.2)
ALK PHOS: 72 U/L (ref 39–117)
ALT: 24 U/L (ref 0–35)
AST: 18 U/L (ref 0–37)
BILIRUBIN TOTAL: 0.8 mg/dL (ref 0.2–1.2)
BUN: 12 mg/dL (ref 6–23)
CO2: 28 mEq/L (ref 19–32)
CREATININE: 0.92 mg/dL (ref 0.40–1.20)
Calcium: 9.1 mg/dL (ref 8.4–10.5)
Chloride: 106 mEq/L (ref 96–112)
GFR: 80.7 mL/min (ref >60.00)
Glucose, Bld: 93 mg/dL (ref 70–99)
Potassium: 4.2 mEq/L (ref 3.5–5.1)
SODIUM: 139 meq/L (ref 135–145)
TOTAL PROTEIN: 6.8 g/dL (ref 6.0–8.3)

## 2017-10-29 LAB — LIPID PANEL
CHOLESTEROL: 202 mg/dL — AB (ref 0–200)
HDL: 47.4 mg/dL (ref >39.00)
LDL CALC: 139 mg/dL — AB (ref 0–99)
NonHDL: 154.17
TRIGLYCERIDES: 76 mg/dL (ref 0.0–149.0)
Total CHOL/HDL Ratio: 4
VLDL: 15.2 mg/dL (ref 0.0–40.0)

## 2017-10-29 LAB — TSH: TSH: 1.05 u[IU]/mL (ref 0.35–4.50)

## 2017-10-31 ENCOUNTER — Encounter: Payer: Self-pay | Admitting: Nurse Practitioner

## 2017-10-31 ENCOUNTER — Other Ambulatory Visit (INDEPENDENT_AMBULATORY_CARE_PROVIDER_SITE_OTHER): Payer: 59

## 2017-10-31 ENCOUNTER — Other Ambulatory Visit: Payer: Self-pay | Admitting: Nurse Practitioner

## 2017-10-31 DIAGNOSIS — Z0001 Encounter for general adult medical examination with abnormal findings: Secondary | ICD-10-CM | POA: Diagnosis not present

## 2017-10-31 DIAGNOSIS — T781XXA Other adverse food reactions, not elsewhere classified, initial encounter: Secondary | ICD-10-CM | POA: Diagnosis not present

## 2017-10-31 LAB — CBC WITH DIFFERENTIAL/PLATELET
BASOS ABS: 0 10*3/uL (ref 0.0–0.1)
Basophils Relative: 0.9 % (ref 0.0–3.0)
Eosinophils Absolute: 0.1 10*3/uL (ref 0.0–0.7)
Eosinophils Relative: 3 % (ref 0.0–5.0)
HCT: 44.7 % (ref 36.0–46.0)
Hemoglobin: 14.6 g/dL (ref 12.0–15.0)
LYMPHS ABS: 1.7 10*3/uL (ref 0.7–4.0)
LYMPHS PCT: 42.4 % (ref 12.0–46.0)
MCHC: 32.6 g/dL (ref 30.0–36.0)
MCV: 88.7 fl (ref 78.0–100.0)
MONOS PCT: 8.7 % (ref 3.0–12.0)
Monocytes Absolute: 0.3 10*3/uL (ref 0.1–1.0)
NEUTROS PCT: 45 % (ref 43.0–77.0)
Neutro Abs: 1.8 10*3/uL (ref 1.4–7.7)
Platelets: 230 10*3/uL (ref 150.0–400.0)
RBC: 5.04 Mil/uL (ref 3.87–5.11)
RDW: 14.2 % (ref 11.5–15.5)
WBC: 4 10*3/uL (ref 4.0–10.5)

## 2017-11-01 LAB — FOOD ALLERGY PROFILE
Clam IgE: <0.1 kU/L
Egg White IgE: 0.19 kU/L — AB
Milk IgE: 0.3 kU/L — AB
PEANUT IGE: 0.15 kU/L — AB
Scallop IgE: <0.1 kU/L
Sesame Seed IgE: <0.1 kU/L
Soybean IgE: 0.12 kU/L — AB

## 2018-01-02 ENCOUNTER — Ambulatory Visit: Payer: 59 | Admitting: Nurse Practitioner

## 2018-01-02 ENCOUNTER — Ambulatory Visit: Payer: Self-pay | Admitting: *Deleted

## 2018-01-02 ENCOUNTER — Encounter: Payer: Self-pay | Admitting: Nurse Practitioner

## 2018-01-02 VITALS — BP 130/84 | HR 83 | Temp 98.4°F | Ht 66.0 in | Wt 243.0 lb

## 2018-01-02 DIAGNOSIS — F458 Other somatoform disorders: Secondary | ICD-10-CM | POA: Diagnosis not present

## 2018-01-02 DIAGNOSIS — R5383 Other fatigue: Secondary | ICD-10-CM | POA: Diagnosis not present

## 2018-01-02 DIAGNOSIS — W57XXXA Bitten or stung by nonvenomous insect and other nonvenomous arthropods, initial encounter: Secondary | ICD-10-CM

## 2018-01-02 DIAGNOSIS — L03312 Cellulitis of back [any part except buttock]: Secondary | ICD-10-CM | POA: Diagnosis not present

## 2018-01-02 DIAGNOSIS — R12 Heartburn: Secondary | ICD-10-CM | POA: Diagnosis not present

## 2018-01-02 DIAGNOSIS — R5381 Other malaise: Secondary | ICD-10-CM | POA: Diagnosis not present

## 2018-01-02 DIAGNOSIS — L089 Local infection of the skin and subcutaneous tissue, unspecified: Secondary | ICD-10-CM | POA: Diagnosis not present

## 2018-01-02 DIAGNOSIS — S30861A Insect bite (nonvenomous) of abdominal wall, initial encounter: Secondary | ICD-10-CM | POA: Diagnosis not present

## 2018-01-02 DIAGNOSIS — S20462A Insect bite (nonvenomous) of left back wall of thorax, initial encounter: Secondary | ICD-10-CM

## 2018-01-02 MED ORDER — DOXYCYCLINE HYCLATE 100 MG PO TABS: 100.0000 mg | ORAL_TABLET | Freq: Two times a day (BID) | ORAL | 0 refills | Status: AC

## 2018-01-02 MED ORDER — OMEPRAZOLE 20 MG PO CPDR: 20.0000 mg | DELAYED_RELEASE_CAPSULE | Freq: Every day | ORAL | 5 refills | Status: DC

## 2018-01-02 MED ORDER — CEFTRIAXONE SODIUM 500 MG IJ SOLR
500.0000 mg | Freq: Once | INTRAMUSCULAR | Status: AC
  Administered 2018-01-02: 500 mg via INTRAMUSCULAR

## 2018-01-02 NOTE — Telephone Encounter (Signed)
Patient had reaction to bite- but UC thought she also had she had other infection as well. She was given antibiotic 10 day treatment. SMX-TMP DS 800/160 mg. Patient reports her BP was up 180/113- at [facility]- she was having SOB and  she used inhaler. Patient left with her BP elevated and was told to follow up with PCP. 121/74 this morning. Patient has an appointment this morning.Reaasurance given that she did proper things- keep appointment today to have "bite" area looked at since it has opened and is draining.

## 2018-01-02 NOTE — Patient Instructions (Signed)
Use warm compress 2-3times a day(6mins at a time).  Cellulitis, Adult Cellulitis is a skin infection. The infected area is usually red and tender. This condition occurs most often in the arms and lower legs. The infection can travel to the muscles, blood, and underlying tissue and become serious. It is very important to get treated for this condition. What are the causes? Cellulitis is caused by bacteria. The bacteria enter through a break in the skin, such as a cut, burn, insect bite, open sore, or crack. What increases the risk? This condition is more likely to occur in people who:  Have a weak defense system (immune system).  Have open wounds on the skin such as cuts, burns, bites, and scrapes. Bacteria can enter the body through these open wounds.  Are older.  Have diabetes.  Have a type of long-lasting (chronic) liver disease (cirrhosis) or kidney disease.  Use IV drugs.  What are the signs or symptoms? Symptoms of this condition include:  Redness, streaking, or spotting on the skin.  Swollen area of the skin.  Tenderness or pain when an area of the skin is touched.  Warm skin.  Fever.  Chills.  Blisters.  How is this diagnosed? This condition is diagnosed based on a medical history and physical exam. You may also have tests, including:  Blood tests.  Lab tests.  Imaging tests.  How is this treated? Treatment for this condition may include:  Medicines, such as antibiotic medicines or antihistamines.  Supportive care, such as rest and application of cold or warm cloths (cold or warm compresses) to the skin.  Hospital care, if the condition is severe.  The infection usually gets better within 1-2 days of treatment. Follow these instructions at home:  Take over-the-counter and prescription medicines only as told by your health care provider.  If you were prescribed an antibiotic medicine, take it as told by your health care provider. Do not stop taking  the antibiotic even if you start to feel better.  Drink enough fluid to keep your urine clear or pale yellow.  Do not touch or rub the infected area.  Raise (elevate) the infected area above the level of your heart while you are sitting or lying down.  Apply warm or cold compresses to the affected area as told by your health care provider.  Keep all follow-up visits as told by your health care provider. This is important. These visits let your health care provider make sure a more serious infection is not developing. Contact a health care provider if:  You have a fever.  Your symptoms do not improve within 1-2 days of starting treatment.  Your bone or joint underneath the infected area becomes painful after the skin has healed.  Your infection returns in the same area or another area.  You notice a swollen bump in the infected area.  You develop new symptoms.  You have a general ill feeling (malaise) with muscle aches and pains. Get help right away if:  Your symptoms get worse.  You feel very sleepy.  You develop vomiting or diarrhea that persists.  You notice red streaks coming from the infected area.  Your red area gets larger or turns dark in color. This information is not intended to replace advice given to you by your health care provider. Make sure you discuss any questions you have with your health care provider. Document Released: 08/02/2005 Document Revised: 03/02/2016 Document Reviewed: 09/01/2015 Elsevier Interactive Patient Education  [employer].

## 2018-01-02 NOTE — Telephone Encounter (Signed)
FYI

## 2018-01-02 NOTE — Progress Notes (Signed)
Subjective:  Patient ID: Claire Sutton, female    DOB: [birth year]  Age: 57 y.o. MRN: [id number]  CC: Hypertension (BP was high yesterday 182/113--this morning--121/74/ went to urgent care yesterday for red,swelling,sore,knot on left side---abx given/ refills for omeprazol??/gas)  Rash  This is a new problem. The current episode started in the past 7 days. The problem has been gradually worsening since onset. The affected locations include the back. The rash is characterized by pain, redness and swelling. It is unknown if there was an exposure to a precipitant. Associated symptoms include anorexia, fatigue and joint pain. Pertinent negatives include no congestion, cough, diarrhea, eye pain, facial edema, fever, nail changes, shortness of breath, sore throat or vomiting. Past treatments include antibiotics (bactrim started yesterday). The treatment provided mild relief. Her past medical history is significant for allergies and asthma. There is no history of eczema or varicella.  also report elevated BP, SOB and heart rate yesterday at urgent care.  GERD: Improved with omeprazole. Needs refill. Reports symptoms return when off medication for 1week.  Outpatient Medications Prior to Visit  Medication Sig Dispense Refill  . beclomethasone (QVAR REDIHALER) 40 MCG/ACT inhaler Inhale 1 puff into the lungs daily. 10.6 g 0  . Cetirizine HCl (ZYRTEC PO) Take 1 tablet by mouth daily.     . Cholecalciferol (VITAMIN D PO) Take 1 tablet by mouth daily.     Marland Kitchen EPINEPHrine 0.3 mg/0.3 mL IJ SOAJ injection use as directed by prescriber  0  . montelukast (SINGULAIR) 10 MG tablet Take 1 tablet (10 mg total) by mouth at bedtime. 90 tablet 1  . Multiple Vitamins-Calcium (ONE-A-DAY WOMENS PO) Take 1 tablet by mouth daily.     . VENTOLIN HFA 108 (90 Base) MCG/ACT inhaler inhale 2 puffs by mouth every 6 hours if needed for wheezing or shortness of breath 18 g 0  . sulfamethoxazole-trimethoprim (BACTRIM DS,SEPTRA DS)  800-160 MG tablet Take 1 tablet by mouth 2 (two) times daily.  0  . omeprazole (PRILOSEC) 20 MG capsule Take 1 capsule (20 mg total) by mouth daily. (Patient not taking: Reported on 01/02/2018) 14 capsule 0   No facility-administered medications prior to visit.     ROS See HPI  Objective:  BP 130/84   Pulse 83   Temp 98.4 F (36.9 C)   Ht 5\' 6"  (1.676 m)   Wt 243 lb (110.2 kg)   SpO2 98%   BMI 39.22 kg/m   BP Readings from Last 3 Encounters:  01/02/18 130/84  10/26/17 (!) 140/92  08/15/17 140/88    Wt Readings from Last 3 Encounters:  01/02/18 243 lb (110.2 kg)  10/26/17 246 lb (111.6 kg)  08/15/17 246 lb (111.6 kg)    Physical Exam  Constitutional: She is oriented to person, place, and time. No distress.  Neck: Normal range of motion. Neck supple.  Cardiovascular: Normal rate, regular rhythm and normal heart sounds.  Pulmonary/Chest: Effort normal and breath sounds normal.  Musculoskeletal: She exhibits tenderness.  Lymphadenopathy:    She has no cervical adenopathy.  Neurological: She is alert and oriented to person, place, and time.  Skin: Rash noted. Rash is macular. There is erythema.     Psychiatric: She has a normal mood and affect. Her behavior is normal.  Vitals reviewed.   Lab Results  Component Value Date   WBC 4.0 10/31/2017   HGB 14.6 10/31/2017   HCT 44.7 10/31/2017   PLT 230.0 10/31/2017   GLUCOSE 93 10/29/2017   CHOL  202 (H) 10/29/2017   TRIG 76.0 10/29/2017   HDL 47.40 10/29/2017   LDLCALC 139 (H) 10/29/2017   ALT 24 10/29/2017   AST 18 10/29/2017   NA 139 10/29/2017   K 4.2 10/29/2017   CL 106 10/29/2017   CREATININE 0.92 10/29/2017   BUN 12 10/29/2017   CO2 28 10/29/2017   TSH 1.05 10/29/2017    Dg Chest 2 View  Result Date: 08/22/2017 CLINICAL DATA:  Cough and congestion with wheezing and shortness of breath EXAM: CHEST  2 VIEW COMPARISON:  September 07, 2016 and December 27, 2007 FINDINGS: Lungs are clear. The heart size and  pulmonary vascularity are normal. No adenopathy. No bone lesions. IMPRESSION: No edema or consolidation. Electronically Signed   By: Lowella Grip III M.D.   On: 08/22/2017 17:24    Assessment & Plan:   Claire Sutton was seen today for hypertension.  Diagnoses and all orders for this visit:  Insect bite of back, infected, left, initial encounter -     cefTRIAXone (ROCEPHIN) injection 500 mg -     doxycycline (VIBRA-TABS) 100 MG tablet; Take 1 tablet (100 mg total) by mouth 2 (two) times daily for 7 days.  Malaise and fatigue -     cefTRIAXone (ROCEPHIN) injection 500 mg -     doxycycline (VIBRA-TABS) 100 MG tablet; Take 1 tablet (100 mg total) by mouth 2 (two) times daily for 7 days.  Cellulitis of back except buttock -     cefTRIAXone (ROCEPHIN) injection 500 mg -     doxycycline (VIBRA-TABS) 100 MG tablet; Take 1 tablet (100 mg total) by mouth 2 (two) times daily for 7 days.  Globus hystericus -     omeprazole (PRILOSEC) 20 MG capsule; Take 1 capsule (20 mg total) by mouth daily.  Heart burn -     omeprazole (PRILOSEC) 20 MG capsule; Take 1 capsule (20 mg total) by mouth daily.   I have discontinued Claire Sutton's sulfamethoxazole-trimethoprim. I am also having her start on doxycycline. Additionally, I am having her maintain her Multiple Vitamins-Calcium (ONE-A-DAY WOMENS PO), Cetirizine HCl (ZYRTEC PO), Cholecalciferol (VITAMIN D PO), montelukast, beclomethasone, VENTOLIN HFA, EPINEPHrine, and omeprazole. We administered cefTRIAXone.  Meds ordered this encounter  Medications  . cefTRIAXone (ROCEPHIN) injection 500 mg  . doxycycline (VIBRA-TABS) 100 MG tablet    Sig: Take 1 tablet (100 mg total) by mouth 2 (two) times daily for 7 days.    Dispense:  14 tablet    Refill:  0    Order Specific Question:   Supervising Provider    Answer:   Lucille Passy [[id number]]  . omeprazole (PRILOSEC) 20 MG capsule    Sig: Take 1 capsule (20 mg total) by mouth daily.    Dispense:  30 capsule     Refill:  5    Order Specific Question:   Supervising Provider    Answer:   Lucille Passy [[id number]]    Follow-up: Return in about 1 week (around 01/09/2018) for re eval of wound.  Wilfred Lacy, NP

## 2018-01-10 ENCOUNTER — Ambulatory Visit: Payer: 59 | Admitting: Nurse Practitioner

## 2018-01-10 ENCOUNTER — Encounter: Payer: Self-pay | Admitting: Nurse Practitioner

## 2018-01-10 VITALS — BP 150/92 | HR 65 | Temp 98.3°F | Ht 66.0 in | Wt 248.0 lb

## 2018-01-10 DIAGNOSIS — L03312 Cellulitis of back [any part except buttock]: Secondary | ICD-10-CM | POA: Diagnosis not present

## 2018-01-10 DIAGNOSIS — W57XXXA Bitten or stung by nonvenomous insect and other nonvenomous arthropods, initial encounter: Secondary | ICD-10-CM

## 2018-01-10 DIAGNOSIS — L089 Local infection of the skin and subcutaneous tissue, unspecified: Secondary | ICD-10-CM

## 2018-01-10 DIAGNOSIS — S20462A Insect bite (nonvenomous) of left back wall of thorax, initial encounter: Secondary | ICD-10-CM

## 2018-01-10 NOTE — Progress Notes (Signed)
Subjective:  Patient ID: Claire Sutton, female    DOB: [birth year]  Age: 57 y.o. MRN: [id number]  CC: Follow-up (wound is better,still has knot,drainge since Friday,pain at times/blister form above wound area)   HPI Cellulitis: Reports improving with completion of doxycycline. denies any fever. Resolved pain, redness and drainage.  Outpatient Medications Prior to Visit  Medication Sig Dispense Refill  . Cetirizine HCl (ZYRTEC PO) Take 1 tablet by mouth daily.     . Cholecalciferol (VITAMIN D PO) Take 1 tablet by mouth daily.     Marland Kitchen EPINEPHrine 0.3 mg/0.3 mL IJ SOAJ injection use as directed by prescriber  0  . montelukast (SINGULAIR) 10 MG tablet Take 1 tablet (10 mg total) by mouth at bedtime. 90 tablet 1  . Multiple Vitamins-Calcium (ONE-A-DAY WOMENS PO) Take 1 tablet by mouth daily.     Marland Kitchen omeprazole (PRILOSEC) 20 MG capsule Take 1 capsule (20 mg total) by mouth daily. 30 capsule 5  . VENTOLIN HFA 108 (90 Base) MCG/ACT inhaler inhale 2 puffs by mouth every 6 hours if needed for wheezing or shortness of breath 18 g 0  . beclomethasone (QVAR REDIHALER) 40 MCG/ACT inhaler Inhale 1 puff into the lungs daily. (Patient not taking: Reported on 01/10/2018) 10.6 g 0   No facility-administered medications prior to visit.     ROS See HPI  Objective:  BP (!) 162/94   Pulse 65   Temp 98.3 F (36.8 C)   Ht 5\' 6"  (1.676 m)   Wt 248 lb (112.5 kg)   SpO2 98%   BMI 40.03 kg/m   BP Readings from Last 3 Encounters:  01/10/18 (!) 162/94  01/02/18 130/84  10/26/17 (!) 140/92    Wt Readings from Last 3 Encounters:  01/10/18 248 lb (112.5 kg)  01/02/18 243 lb (110.2 kg)  10/26/17 246 lb (111.6 kg)    Physical Exam  Constitutional: She is oriented to person, place, and time. No distress.  Cardiovascular: Normal rate.  Pulmonary/Chest: Effort normal.  Abdominal: She exhibits no distension. There is no tenderness.    Neurological: She is alert and oriented to person, place, and  time.  Vitals reviewed.   Lab Results  Component Value Date   WBC 4.0 10/31/2017   HGB 14.6 10/31/2017   HCT 44.7 10/31/2017   PLT 230.0 10/31/2017   GLUCOSE 93 10/29/2017   CHOL 202 (H) 10/29/2017   TRIG 76.0 10/29/2017   HDL 47.40 10/29/2017   LDLCALC 139 (H) 10/29/2017   ALT 24 10/29/2017   AST 18 10/29/2017   NA 139 10/29/2017   K 4.2 10/29/2017   CL 106 10/29/2017   CREATININE 0.92 10/29/2017   BUN 12 10/29/2017   CO2 28 10/29/2017   TSH 1.05 10/29/2017    Dg Chest 2 View  Result Date: 08/22/2017 CLINICAL DATA:  Cough and congestion with wheezing and shortness of breath EXAM: CHEST  2 VIEW COMPARISON:  September 07, 2016 and December 27, 2007 FINDINGS: Lungs are clear. The heart size and pulmonary vascularity are normal. No adenopathy. No bone lesions. IMPRESSION: No edema or consolidation. Electronically Signed   By: Lowella Grip III M.D.   On: 08/22/2017 17:24    Assessment & Plan:   Rashida was seen today for follow-up.  Diagnoses and all orders for this visit:  Insect bite of back, infected, left, initial encounter  Cellulitis of back except buttock   I am having Claire Sutton maintain her Multiple Vitamins-Calcium (ONE-A-DAY WOMENS PO), Cetirizine HCl (  ZYRTEC PO), Cholecalciferol (VITAMIN D PO), montelukast, beclomethasone, VENTOLIN HFA, EPINEPHrine, and omeprazole.  No orders of the defined types were placed in this encounter.   Follow-up: Return in about 2 weeks (around 01/24/2018) for HTN and abscess.  Wilfred Lacy, NP

## 2018-01-10 NOTE — Patient Instructions (Signed)
Resume bactrim 1tab every 12hrs x 7days with food.  Start florastor or curturelle probiotic 1cap once a day x 10days.  F/up in 2weeks.  Monitor BP at home 2-3x/week. Return to office sooner if BP persistent >150/80.

## 2018-01-11 ENCOUNTER — Ambulatory Visit: Payer: Self-pay | Admitting: *Deleted

## 2018-01-11 NOTE — Telephone Encounter (Signed)
Spoke with the pt. Advise pt to go to the ER if she has chest pain,numbness and tingling in her extremities, SOB. Pt is aware.   Do you want to see her next week for BP?

## 2018-01-11 NOTE — Telephone Encounter (Signed)
Left vm for the pt to call back.  

## 2018-01-11 NOTE — Telephone Encounter (Signed)
Ok to make appt

## 2018-01-11 NOTE — Telephone Encounter (Signed)
Pt  Was seen  Yesterday at  [facility] by  Wilfred Lacy  Her  Bp  Was  Elevated  At that  Time   She  States   She was  Advised  To call back  If BP over  150     She  Reports last evening  At about  8 pm  Her Bp  Was  198/103   She  States  She took some  Vinegar  And  Water and  Went to sleep   She  States  This  Morning at  6  Am  It  Was   166/95    At  8 am  It  Was  146/90   -  The  Only symptom  Is  Some mild  Nausea  After taking  Anti biotic   Pt  Call back  Number is  [phone]

## 2018-01-14 NOTE — Telephone Encounter (Signed)
Spoke with PT report of BP 131/84 on 3.10.2019. Offered an appt for BP consult, PT denied.

## 2018-01-25 ENCOUNTER — Ambulatory Visit (INDEPENDENT_AMBULATORY_CARE_PROVIDER_SITE_OTHER): Payer: 59 | Admitting: Gynecology

## 2018-01-25 ENCOUNTER — Ambulatory Visit: Payer: 59 | Admitting: Nurse Practitioner

## 2018-01-25 ENCOUNTER — Encounter: Payer: Self-pay | Admitting: Gynecology

## 2018-01-25 ENCOUNTER — Encounter: Payer: Self-pay | Admitting: Nurse Practitioner

## 2018-01-25 VITALS — BP 172/98 | HR 60 | Temp 98.1°F | Ht 65.5 in | Wt 249.4 lb

## 2018-01-25 VITALS — BP 130/80 | Ht 66.5 in | Wt 248.0 lb

## 2018-01-25 DIAGNOSIS — L03312 Cellulitis of back [any part except buttock]: Secondary | ICD-10-CM | POA: Diagnosis not present

## 2018-01-25 DIAGNOSIS — Z01411 Encounter for gynecological examination (general) (routine) with abnormal findings: Secondary | ICD-10-CM | POA: Diagnosis not present

## 2018-01-25 DIAGNOSIS — I10 Essential (primary) hypertension: Secondary | ICD-10-CM

## 2018-01-25 DIAGNOSIS — N952 Postmenopausal atrophic vaginitis: Secondary | ICD-10-CM

## 2018-01-25 LAB — BASIC METABOLIC PANEL
BUN: 15 mg/dL (ref 7–25)
CHLORIDE: 107 mmol/L (ref 98–110)
CO2: 26 mmol/L (ref 20–32)
CREATININE: 0.95 mg/dL (ref 0.50–1.05)
Calcium: 8.8 mg/dL (ref 8.6–10.4)
Glucose, Bld: 82 mg/dL (ref 65–99)
POTASSIUM: 4.2 mmol/L (ref 3.5–5.3)
Sodium: 140 mmol/L (ref 135–146)

## 2018-01-25 LAB — POCT URINALYSIS DIPSTICK
BILIRUBIN UA: NEGATIVE
Glucose, UA: NEGATIVE
KETONES UA: NEGATIVE
Leukocytes, UA: NEGATIVE
NITRITE UA: NEGATIVE
PH UA: 6 (ref 5.0–8.0)
PROTEIN UA: NEGATIVE
RBC UA: NEGATIVE
SPEC GRAV UA: 1.02 (ref 1.010–1.025)
Urobilinogen, UA: 0.2 E.U./dL

## 2018-01-25 LAB — HEPATIC FUNCTION PANEL
AG RATIO: 1.2 (calc) (ref 1.0–2.5)
ALKALINE PHOSPHATASE (APISO): 76 U/L (ref 33–130)
ALT: 17 U/L (ref 6–29)
AST: 18 U/L (ref 10–35)
Albumin: 3.7 g/dL (ref 3.6–5.1)
BILIRUBIN TOTAL: 0.5 mg/dL (ref 0.2–1.2)
Bilirubin, Direct: 0.1 mg/dL (ref 0.0–0.2)
GLOBULIN: 3 g/dL (ref 1.9–3.7)
Indirect Bilirubin: 0.4 mg/dL (calc) (ref 0.2–1.2)
TOTAL PROTEIN: 6.7 g/dL (ref 6.1–8.1)

## 2018-01-25 NOTE — Progress Notes (Signed)
    Claire Sutton October 12, 1961 [id number]        57 y.o.  [id number] for annual gynecologic exam.  Without gynecologic complaints  Past medical history,surgical history, problem list, medications, allergies, family history and social history were all reviewed and documented as reviewed in the EPIC chart.  ROS:  Performed with pertinent positives and negatives included in the history, assessment and plan.   Additional significant findings : None   Exam: [name] assistant Vitals:   01/25/18 0753  BP: 130/80  Weight: 248 lb (112.5 kg)  Height: 5' 6.5" (1.689 m)   Body mass index is 39.43 kg/m.  General appearance:  Normal affect, orientation and appearance. Skin: Grossly normal excepting small nodularity left posterior mid flank HEENT: Without gross lesions.  No cervical or supraclavicular adenopathy. Thyroid normal.  Lungs:  Clear without wheezing, rales or rhonchi Cardiac: RR, without RMG Abdominal:  Soft, nontender, without masses, guarding, rebound, organomegaly or hernia Breasts:  Examined lying and sitting without masses, retractions, discharge or axillary adenopathy. Pelvic:  Ext, BUS, Vagina: Normal with mild atrophic changes  Cervix: Normal with mild atrophic changes  Uterus: Anteverted, normal size, shape and contour, midline and mobile nontender   Adnexa: Without masses or tenderness    Anus and perineum: Normal   Rectovaginal: Normal sphincter tone without palpated masses or tenderness.    Assessment/Plan:  57 y.o. [id number] female for annual gynecologic exam.   1. Postmenopausal/atrophic genital changes.  No significant hot flushes, night sweats, vaginal dryness or any vaginal bleeding.  Continue to monitor and report any issues or bleeding. 2. Pap smear/HPV 2016.  No Pap smear done today.  History of LGSIL 2005 with ASCUS negative high risk HPV 2007.  Normal Pap smears afterwards.  Plan repeat Pap smear at 5-year interval per current screening  guidelines. 3. Mammography 08/2017.  Continue with annual mammography when due.  Breast exam normal today. 4. Colonoscopy 2014.  Repeat at their recommended interval. 5. DEXA never.  Will plan further into the menopause. 6. Health maintenance.  Patient being followed for infected insect bite left flank region.  Was treated with antibiotics which sounds like for cellulitis.  Symptoms now resolving with residual nodularity.  Has appointment to see her doctor this afternoon for this.  No routine blood work done as patient does this elsewhere.  Follow-up 1 year, sooner as needed.   Anastasio Auerbach MD, 8:24 AM 01/25/2018

## 2018-01-25 NOTE — Patient Instructions (Addendum)
Go to lab for blood draw.  Your urine was negative for protein.  Does not need additional oral abx.  Continue to check BP morning and evening Bring BP readings to next office visit.  Hypertension Hypertension is another name for high blood pressure. High blood pressure forces your heart to work harder to pump blood. This can cause problems over time. There are two numbers in a blood pressure reading. There is a top number (systolic) over a bottom number (diastolic). It is best to have a blood pressure below 120/80. Healthy choices can help lower your blood pressure. You may need medicine to help lower your blood pressure if:  Your blood pressure cannot be lowered with healthy choices.  Your blood pressure is higher than 130/80.  Follow these instructions at home: Eating and drinking  If directed, follow the DASH eating plan. This diet includes: ? Filling half of your plate at each meal with fruits and vegetables. ? Filling one quarter of your plate at each meal with whole grains. Whole grains include whole wheat pasta, brown rice, and whole grain bread. ? Eating or drinking low-fat dairy products, such as skim milk or low-fat yogurt. ? Filling one quarter of your plate at each meal with low-fat (lean) proteins. Low-fat proteins include fish, skinless chicken, eggs, beans, and tofu. ? Avoiding fatty meat, cured and processed meat, or chicken with skin. ? Avoiding premade or processed food.  Eat less than 1,500 mg of salt (sodium) a day.  Limit alcohol use to no more than 1 drink a day for nonpregnant women and 2 drinks a day for men. One drink equals 12 oz of beer, 5 oz of wine, or 1 oz of hard liquor. Lifestyle  Work with your doctor to stay at a healthy weight or to lose weight. Ask your doctor what the best weight is for you.  Get at least 30 minutes of exercise that causes your heart to beat faster (aerobic exercise) most days of the week. This may include walking, swimming, or  biking.  Get at least 30 minutes of exercise that strengthens your muscles (resistance exercise) at least 3 days a week. This may include lifting weights or pilates.  Do not use any products that contain nicotine or tobacco. This includes cigarettes and e-cigarettes. If you need help quitting, ask your doctor.  Check your blood pressure at home as told by your doctor.  Keep all follow-up visits as told by your doctor. This is important. Medicines  Take over-the-counter and prescription medicines only as told by your doctor. Follow directions carefully.  Do not skip doses of blood pressure medicine. The medicine does not work as well if you skip doses. Skipping doses also puts you at risk for problems.  Ask your doctor about side effects or reactions to medicines that you should watch for. Contact a doctor if:  You think you are having a reaction to the medicine you are taking.  You have headaches that keep coming back (recurring).  You feel dizzy.  You have swelling in your ankles.  You have trouble with your vision. Get help right away if:  You get a very bad headache.  You start to feel confused.  You feel weak or numb.  You feel faint.  You get very bad pain in your: ? Chest. ? Belly (abdomen).  You throw up (vomit) more than once.  You have trouble breathing. Summary  Hypertension is another name for high blood pressure.  Making healthy choices  can help lower blood pressure. If your blood pressure cannot be controlled with healthy choices, you may need to take medicine. This information is not intended to replace advice given to you by your health care provider. Make sure you discuss any questions you have with your health care provider. Document Released: 04/10/2008 Document Revised: 09/20/2016 Document Reviewed: 09/20/2016 Elsevier Interactive Patient Education  [employer].

## 2018-01-25 NOTE — Patient Instructions (Signed)
Follow up in one year for annual exam 

## 2018-01-25 NOTE — Progress Notes (Signed)
Subjective:  Patient ID: Claire Sutton, female    DOB: [birth year]  Age: 57 y.o. MRN: [id number]  CC: Follow-up ( F/U HTN. pt says BP has been going up and down since finishing antibiotic.Abcess is smaller but not completely gone, no pain.)  HPI  HTN: report fluctuating BP at home: 130/70 to 180/90. Could be high is morning or evening. Has not noticed any specific pattern. Intermittent lightedheadedness and headache when  BP elevated. Denies any OTC med or change in diet. Has not been able to exercise regularly due to weather. BP Readings from Last 3 Encounters:  01/25/18 (!) 172/98  01/25/18 130/80  01/10/18 (!) 150/92   Improving area of swelling, resolved pain and redness. Has completed oral abx as prescribed.  Outpatient Medications Prior to Visit  Medication Sig Dispense Refill  . beclomethasone (QVAR REDIHALER) 40 MCG/ACT inhaler Inhale 1 puff into the lungs daily. 10.6 g 0  . Cetirizine HCl (ZYRTEC PO) Take 1 tablet by mouth daily.     . Cholecalciferol (VITAMIN D PO) Take 1 tablet by mouth daily.     Marland Kitchen EPINEPHrine 0.3 mg/0.3 mL IJ SOAJ injection use as directed by prescriber  0  . montelukast (SINGULAIR) 10 MG tablet Take 1 tablet (10 mg total) by mouth at bedtime. 90 tablet 1  . Multiple Vitamins-Calcium (ONE-A-DAY WOMENS PO) Take 1 tablet by mouth daily.     Marland Kitchen omeprazole (PRILOSEC) 20 MG capsule Take 1 capsule (20 mg total) by mouth daily. 30 capsule 5  . VENTOLIN HFA 108 (90 Base) MCG/ACT inhaler inhale 2 puffs by mouth every 6 hours if needed for wheezing or shortness of breath 18 g 0   No facility-administered medications prior to visit.     ROS See HPI  Objective:  BP (!) 172/98 (BP Location: Right Arm, Patient Position: Sitting, Cuff Size: Large)   Pulse 60   Temp 98.1 F (36.7 C) (Oral)   Ht 5' 5.5" (1.664 m)   Wt 249 lb 6.4 oz (113.1 kg)   SpO2 97%   BMI 40.87 kg/m   BP Readings from Last 3 Encounters:  01/25/18 (!) 172/98  01/25/18 130/80    01/10/18 (!) 150/92    Wt Readings from Last 3 Encounters:  01/25/18 249 lb 6.4 oz (113.1 kg)  01/25/18 248 lb (112.5 kg)  01/10/18 248 lb (112.5 kg)    Physical Exam  Constitutional: She is oriented to person, place, and time. No distress.  Neck: Normal range of motion. Neck supple. No thyromegaly present.  Cardiovascular: Normal rate and regular rhythm.  Pulmonary/Chest: Effort normal and breath sounds normal.  Musculoskeletal: She exhibits no edema or tenderness.  Neurological: She is alert and oriented to person, place, and time.  Skin:     Vitals reviewed.  5cm x 1.5cm  Lab Results  Component Value Date   WBC 4.0 10/31/2017   HGB 14.6 10/31/2017   HCT 44.7 10/31/2017   PLT 230.0 10/31/2017   GLUCOSE 82 01/25/2018   CHOL 202 (H) 10/29/2017   TRIG 76.0 10/29/2017   HDL 47.40 10/29/2017   LDLCALC 139 (H) 10/29/2017   ALT 17 01/25/2018   AST 18 01/25/2018   NA 140 01/25/2018   K 4.2 01/25/2018   CL 107 01/25/2018   CREATININE 0.95 01/25/2018   BUN 15 01/25/2018   CO2 26 01/25/2018   TSH 1.05 10/29/2017    Dg Chest 2 View  Result Date: 08/22/2017 CLINICAL DATA:  Cough and congestion with wheezing and  shortness of breath EXAM: CHEST  2 VIEW COMPARISON:  September 07, 2016 and December 27, 2007 FINDINGS: Lungs are clear. The heart size and pulmonary vascularity are normal. No adenopathy. No bone lesions. IMPRESSION: No edema or consolidation. Electronically Signed   By: Lowella Grip III M.D.   On: 08/22/2017 17:24    Assessment & Plan:   Everlyn was seen today for follow-up.  Diagnoses and all orders for this visit:  Essential hypertension -     POCT urinalysis dipstick -     Basic metabolic panel -     Cancel: Hepatic function panel -     Hepatic function panel -     hydrochlorothiazide (MICROZIDE) 12.5 MG capsule; Take 1 capsule (12.5 mg total) by mouth daily.  Cellulitis of back except buttock   I am having Claire Sutton start on  hydrochlorothiazide. I am also having her maintain her Multiple Vitamins-Calcium (ONE-A-DAY WOMENS PO), Cetirizine HCl (ZYRTEC PO), Cholecalciferol (VITAMIN D PO), montelukast, beclomethasone, VENTOLIN HFA, EPINEPHrine, and omeprazole.  Meds ordered this encounter  Medications  . hydrochlorothiazide (MICROZIDE) 12.5 MG capsule    Sig: Take 1 capsule (12.5 mg total) by mouth daily.    Dispense:  30 capsule    Refill:  0    Order Specific Question:   Supervising Provider    Answer:   Lucille Passy [[id number]]    Follow-up: Return in about 1 month (around 02/22/2018) for HTN.  Wilfred Lacy, NP

## 2018-01-28 MED ORDER — HYDROCHLOROTHIAZIDE 12.5 MG PO CAPS
12.5000 mg | ORAL_CAPSULE | Freq: Every day | ORAL | 0 refills | Status: DC
Start: 2018-01-28 — End: 2018-02-28

## 2018-02-27 ENCOUNTER — Telehealth: Payer: Self-pay | Admitting: Nurse Practitioner

## 2018-02-27 DIAGNOSIS — I10 Essential (primary) hypertension: Secondary | ICD-10-CM

## 2018-02-27 NOTE — Telephone Encounter (Signed)
Copied from [place] [id number]. Topic: Quick Communication - Rx Refill/Question >> Feb 27, 2018  9:46 AM Bea Graff, NT wrote: Medication: hydrochlorothiazide (MICROZIDE) 12.5 MG capsule Has the patient contacted their pharmacy? Yes.   (Agent: If no, request that the patient contact the pharmacy for the refill.) Preferred Pharmacy (with phone number or street name): [facility] [phone] - [place], [address] AT [facility] [address] [phone] (Phone) [phone] (Fax)     Agent: Please be advised that RX refills may take up to 3 business days. We ask that you follow-up with your pharmacy.

## 2018-02-28 MED ORDER — HYDROCHLOROTHIAZIDE 12.5 MG PO CAPS: 12.5000 mg | ORAL_CAPSULE | Freq: Every day | ORAL | 0 refills | Status: DC

## 2018-03-04 NOTE — Telephone Encounter (Signed)
Returned call to pt. To answer question about BP medication and readings.  Reported she ran out of HCTZ on 4/24, and called the pharmacy to request a refill; was advised none was available.  Reported BP's while taking HCTZ were 112/74 and 106/70's.  Since being off the medication, BP's 159/88-176/90's.  Reported she had light-headedness and mild headache on  Saturday and Sunday.  Also reported lack of energy.  Denied any difficulty with speech, extremity weakness, balance issues, or temp. loss of vision.  Reported "some blurred vision and ted, itchy eyes"; unsure if allergies were affecting eyes.  Reported using Organic Vinegar and water to treat high BP.  Phone call to [facility] to confirm receipt of electronic Rx for HCTZ 12.5 mg. Qd.; #30; no RF.  Was advised Rx is available to be picked up by pt.  Call returned to pt. And informed HCTZ is ready for pick-up.  Encouraged to continue to  Monitor BP's, keep journal of readings, keep f/u appt. On 5/2.  Advised to call back if symptoms worsen.  Verb. understanding.

## 2018-03-04 NOTE — Telephone Encounter (Signed)
Pt called to get guidance on the medication or to just wait for her visit on Thursday, pt states over the weekend bp was as high of 176, call pt to advise

## 2018-03-07 ENCOUNTER — Encounter: Payer: Self-pay | Admitting: Nurse Practitioner

## 2018-03-07 ENCOUNTER — Ambulatory Visit: Payer: 59 | Admitting: Nurse Practitioner

## 2018-03-07 VITALS — BP 120/78 | HR 73 | Temp 98.2°F | Ht 65.5 in | Wt 248.0 lb

## 2018-03-07 DIAGNOSIS — G8929 Other chronic pain: Secondary | ICD-10-CM | POA: Diagnosis not present

## 2018-03-07 DIAGNOSIS — J4520 Mild intermittent asthma, uncomplicated: Secondary | ICD-10-CM

## 2018-03-07 DIAGNOSIS — I10 Essential (primary) hypertension: Secondary | ICD-10-CM | POA: Diagnosis not present

## 2018-03-07 DIAGNOSIS — M25551 Pain in right hip: Secondary | ICD-10-CM | POA: Diagnosis not present

## 2018-03-07 DIAGNOSIS — M25559 Pain in unspecified hip: Secondary | ICD-10-CM | POA: Insufficient documentation

## 2018-03-07 MED ORDER — MONTELUKAST SODIUM 10 MG PO TABS: 10.0000 mg | ORAL_TABLET | Freq: Every day | ORAL | 1 refills | Status: DC

## 2018-03-07 MED ORDER — BECLOMETHASONE DIPROP HFA 40 MCG/ACT IN AERB: 1.0000 | INHALATION_SPRAY | Freq: Two times a day (BID) | RESPIRATORY_TRACT | 5 refills | Status: DC

## 2018-03-07 MED ORDER — HYDROCHLOROTHIAZIDE 12.5 MG PO CAPS: 12.5000 mg | ORAL_CAPSULE | Freq: Every day | ORAL | 1 refills | Status: DC

## 2018-03-07 MED ORDER — ALBUTEROL SULFATE HFA 108 (90 BASE) MCG/ACT IN AERS: 1.0000 | INHALATION_SPRAY | Freq: Four times a day (QID) | RESPIRATORY_TRACT | 1 refills | Status: DC | PRN

## 2018-03-07 MED ORDER — DICLOFENAC SODIUM 2 % TD SOLN: 1.0000 [in_us] | Freq: Two times a day (BID) | TRANSDERMAL | 0 refills | Status: DC

## 2018-03-07 NOTE — Patient Instructions (Signed)
Continue HCTZ for BP. Check BP 3times a week and record.  Start Qvar as prescribed. Use albuterol as needed  Alternate between warm and compress on right hip and lower back as needed. Do hip exercise once a day. Return to office if no improvement in hip pin in 6-8weeks.  Hip Bursitis Hip bursitis is swelling of a fluid-filled sac (bursa) in your hip. This swelling (inflammation) can be painful. This condition may come and go over time. Follow these instructions at home: Medicines  Take over-the-counter and prescription medicines only as told by your doctor.  Do not drive or use heavy machinery while taking prescription pain medicine, or as told by your doctor.  If you were prescribed an antibiotic medicine, take it as told by your doctor. Do not stop taking the antibiotic even if you start to feel better. Activity  Return to your normal activities as told by your doctor. Ask your doctor what activities are safe for you.  Rest and protect your hip until you feel better. General instructions  Wear wraps that put pressure on your hip (compression wraps) only as told by your doctor.  Raise (elevate) your hip above the level of your heart as much as you can. To do this, try putting a pillow under your hips while you lie down. Stop if this causes pain.  Do not use your hip to support your body weight until your doctor says that you can.  Use crutches as told by your doctor.  Gently rub and stretch your injured area as often as is comfortable.  Keep all follow-up visits as told by your doctor. This is important. How is this prevented?  Exercise regularly, as told by your doctor.  Warm up and stretch before being active.  Cool down and stretch after being active.  Avoid activities that bother your hip or cause pain.  Avoid sitting down for long periods at a time. Contact a doctor if:  You have a fever.  You get new symptoms.  You have trouble walking.  You have  trouble doing everyday activities.  You have pain that gets worse.  You have pain that does not get better with medicine.  You get red skin on your hip area.  You get a feeling of warmth in your hip area. Get help right away if:  You cannot move your hip.  You have very bad pain. This information is not intended to replace advice given to you by your health care provider. Make sure you discuss any questions you have with your health care provider. Document Released: 11/25/2010 Document Revised: 03/30/2016 Document Reviewed: 05/25/2015 Elsevier Interactive Patient Education  2018 [place].   Hip Exercises Ask your health care provider which exercises are safe for you. Do exercises exactly as told by your health care provider and adjust them as directed. It is normal to feel mild stretching, pulling, tightness, or discomfort as you do these exercises, but you should stop right away if you feel sudden pain or your pain gets worse.Do not begin these exercises until told by your health care provider. STRETCHING AND RANGE OF MOTION EXERCISES These exercises warm up your muscles and joints and improve the movement and flexibility of your hip. These exercises also help to relieve pain, numbness, and tingling. Exercise A: Hamstrings, Supine  1. Lie on your back. 2. Loop a belt or towel over the ball of your left / rightfoot. The ball of your foot is on the walking surface, right under  your toes. 3. Straighten your left / rightknee and slowly pull on the belt to raise your leg. ? Do not let your left / right knee bend while you do this. ? Keep your other leg flat on the floor. ? Raise the left / right leg until you feel a gentle stretch behind your left / right knee or thigh. 4. Hold this position for __________ seconds. 5. Slowly return your leg to the starting position. Repeat __________ times. Complete this stretch __________ times a day. Exercise B: Hip Rotators  1. Lie on your  back on a firm surface. 2. Hold your left / right knee with your left / right hand. Hold your ankle with your other hand. 3. Gently pull your left / right knee and rotate your lower leg toward your other shoulder. ? Pull until you feel a stretch in your buttocks. ? Keep your hips and shoulders firmly planted while you do this stretch. 4. Hold this position for __________ seconds. Repeat __________ times. Complete this stretch __________ times a day. Exercise C: V-Sit (Hamstrings and Adductors)  1. Sit on the floor with your legs extended in a large "V" shape. Keep your knees straight during this exercise. 2. Start with your head and chest upright, then bend at your waist to reach for your left foot (position A). You should feel a stretch in your right inner thigh. 3. Hold this position for __________ seconds. Then slowly return to the upright position. 4. Bend at your waist to reach forward (position B). You should feel a stretch behind both of your thighs and knees. 5. Hold this position for __________ seconds. Then slowly return to the upright position. 6. Bend at your waist to reach for your right foot (position C). You should feel a stretch in your left inner thigh. 7. Hold this position for __________ seconds. Then slowly return to the upright position. Repeat __________ times. Complete this stretch __________ times a day. Exercise D: Lunge (Hip Flexors)  1. Place your left / right knee on the floor and bend your other knee so that is directly over your ankle. You should be half-kneeling. 2. Keep good posture with your head over your shoulders. 3. Tighten your buttocks to point your tailbone downward. This helps your back to keep from arching too much. 4. You should feel a gentle stretch in the front of your left / right thigh and hip. If you do not feel any resistance, slightly slide your other foot forward and then slowly lunge forward so your knee once again lines up over your  ankle. 5. Make sure your tailbone continues to point downward. 6. Hold this position for __________ seconds. Repeat __________ times. Complete this stretch __________ times a day. STRENGTHENING EXERCISES These exercises build strength and endurance in your hip. Endurance is the ability to use your muscles for a long time, even after they get tired. Exercise E: Bridge (Hip Extensors)  1. Lie on your back on a firm surface with your knees bent and your feet flat on the floor. 2. Tighten your buttocks muscles and lift your bottom off the floor until the trunk of your body is level with your thighs. ? Do not arch your back. ? You should feel the muscles working in your buttocks and the back of your thighs. If you do not feel these muscles, slide your feet 1-2 inches (2.5-5 cm) farther away from your buttocks. 3. Hold this position for __________ seconds. 4. Slowly lower your hips  to the starting position. 5. Let your muscles relax completely between repetitions. 6. If this exercise is too easy, try doing it with your arms crossed over your chest. Repeat __________ times. Complete this exercise __________ times a day. Exercise F: Straight Leg Raises - Hip Abductors  1. Lie on your side with your left / right leg in the top position. Lie so your head, shoulder, knee, and hip line up with each other. You may bend your bottom knee to help you balance. 2. Roll your hips slightly forward, so your hips are stacked directly over each other and your left / right knee is facing forward. 3. Leading with your heel, lift your top leg 4-6 inches (10-15 cm). You should feel the muscles in your outer hip lifting. ? Do not let your foot drift forward. ? Do not let your knee roll toward the ceiling. 4. Hold this position for __________ seconds. 5. Slowly return to the starting position. 6. Let your muscles relax completely between repetitions. Repeat __________ times. Complete this exercise __________ times a  day. Exercise G: Straight Leg Raises - Hip Adductors  1. Lie on your side with your left / right leg in the bottom position. Lie so your head, shoulder, knee, and hip line up. You may place your upper foot in front to help you balance. 2. Roll your hips slightly forward, so your hips are stacked directly over each other and your left / right knee is facing forward. 3. Tense the muscles in your inner thigh and lift your bottom leg 4-6 inches (10-15 cm). 4. Hold this position for __________ seconds. 5. Slowly return to the starting position. 6. Let your muscles relax completely between repetitions. Repeat __________ times. Complete this exercise __________ times a day. Exercise H: Straight Leg Raises - Quadriceps  1. Lie on your back with your left / right leg extended and your other knee bent. 2. Tense the muscles in the front of your left / right thigh. When you do this, you should see your kneecap slide up or see increased dimpling just above your knee. 3. Tighten these muscles even more and raise your leg 4-6 inches (10-15 cm) off the floor. 4. Hold this position for __________ seconds. 5. Keep these muscles tense as you lower your leg. 6. Relax the muscles slowly and completely between repetitions. Repeat __________ times. Complete this exercise __________ times a day. Exercise I: Hip Abductors, Standing 1. Tie one end of a rubber exercise band or tubing to a secure surface, such as a table or pole. 2. Loop the other end of the band or tubing around your left / right ankle. 3. Keeping your ankle with the band or tubing directly opposite of the secured end, step away until there is tension in the tubing or band. Hold onto a chair as needed for balance. 4. Lift your left / right leg out to your side. While you do this: ? Keep your back upright. ? Keep your shoulders over your hips. ? Keep your toes pointing forward. ? Make sure to use your hip muscles to lift your leg. Do not "throw" your  leg or tip your body to lift your leg. 5. Hold this position for __________ seconds. 6. Slowly return to the starting position. Repeat __________ times. Complete this exercise __________ times a day. Exercise J: Squats (Quadriceps) 1. Stand in a door frame so your feet and knees are in line with the frame. You may place your hands on the  frame for balance. 2. Slowly bend your knees and lower your hips like you are going to sit in a chair. ? Keep your lower legs in a straight-up-and-down position. ? Do not let your hips go lower than your knees. ? Do not bend your knees lower than told by your health care provider. ? If your hip pain increases, do not bend as low. 3. Hold this position for ___________ seconds. 4. Slowly push with your legs to return to standing. Do not use your hands to pull yourself to standing. Repeat __________ times. Complete this exercise __________ times a day. This information is not intended to replace advice given to you by your health care provider. Make sure you discuss any questions you have with your health care provider. Document Released: 11/10/2005 Document Revised: 07/17/2016 Document Reviewed: 10/18/2015 Elsevier Interactive Patient Education  [employer].

## 2018-03-07 NOTE — Progress Notes (Signed)
Subjective:  Patient ID: Claire Sutton, female    DOB: 03/17/1961  Age: 57 y.o. MRN: [id number]  CC: Follow-up (follow up BP/ still some SOB)   Hip Pain   Incident onset: 49yrs ago. The incident occurred at home. The injury mechanism was a fall (fall 17yrs ago). The quality of the pain is described as aching. The pain has been intermittent since onset. Pertinent negatives include no inability to bear weight, loss of motion, loss of sensation, muscle weakness, numbness or tingling. She reports no foreign bodies present. The symptoms are aggravated by movement and palpation. She has tried rest for the symptoms. The treatment provided significant relief.  radiation to right upper thigh. Onset after fall in snow 36yrs ago.  HTN: Improved BP with HCTZ. Home BP readings: 120/80 to 140/90 with HCTZ. BP Readings from Last 3 Encounters:  03/07/18 120/78  01/25/18 (!) 172/98  01/25/18 130/80   Asthma: Persistent intermittent SOB and cough Use of albuterol prn, montelukast, zyrtec  Outpatient Medications Prior to Visit  Medication Sig Dispense Refill  . Cetirizine HCl (ZYRTEC PO) Take 1 tablet by mouth daily.     . Cholecalciferol (VITAMIN D PO) Take 1 tablet by mouth daily.     Marland Kitchen EPINEPHrine 0.3 mg/0.3 mL IJ SOAJ injection use as directed by prescriber  0  . Multiple Vitamins-Calcium (ONE-A-DAY WOMENS PO) Take 1 tablet by mouth daily.     Marland Kitchen omeprazole (PRILOSEC) 20 MG capsule Take 1 capsule (20 mg total) by mouth daily. 30 capsule 5  . hydrochlorothiazide (MICROZIDE) 12.5 MG capsule Take 1 capsule (12.5 mg total) by mouth daily. 30 capsule 0  . montelukast (SINGULAIR) 10 MG tablet Take 1 tablet (10 mg total) by mouth at bedtime. 90 tablet 1  . VENTOLIN HFA 108 (90 Base) MCG/ACT inhaler inhale 2 puffs by mouth every 6 hours if needed for wheezing or shortness of breath 18 g 0  . beclomethasone (QVAR REDIHALER) 40 MCG/ACT inhaler Inhale 1 puff into the lungs daily. (Patient not taking:  Reported on 03/07/2018) 10.6 g 0   No facility-administered medications prior to visit.     ROS See HPI  Objective:  BP 120/78   Pulse 73   Temp 98.2 F (36.8 C) (Oral)   Ht 5' 5.5" (1.664 m)   Wt 248 lb (112.5 kg)   SpO2 97%   BMI 40.64 kg/m   BP Readings from Last 3 Encounters:  03/07/18 120/78  01/25/18 (!) 172/98  01/25/18 130/80    Wt Readings from Last 3 Encounters:  03/07/18 248 lb (112.5 kg)  01/25/18 249 lb 6.4 oz (113.1 kg)  01/25/18 248 lb (112.5 kg)   Today's Peak Flow Meter: 330, 320, 375ml/min.  Physical Exam  Constitutional: She is oriented to person, place, and time. No distress.  Cardiovascular: Normal rate, regular rhythm and normal heart sounds.  Pulmonary/Chest: Effort normal. No stridor. No respiratory distress. She has wheezes. She has no rales.  Genitourinary: Vagina normal and uterus normal.  Musculoskeletal: She exhibits tenderness. She exhibits no edema or deformity.       Right hip: She exhibits tenderness. She exhibits normal range of motion, normal strength and no bony tenderness.       Left hip: Normal.       Right knee: Normal.       Lumbar back: She exhibits tenderness. She exhibits normal range of motion and no bony tenderness.  Paraspinal lumbar muscle tenderness. Tenderness over right trochanter bursa.  Neurological: She  is alert and oriented to person, place, and time.  Psychiatric: She has a normal mood and affect. Her behavior is normal.  Vitals reviewed.  Lab Results  Component Value Date   WBC 4.0 10/31/2017   HGB 14.6 10/31/2017   HCT 44.7 10/31/2017   PLT 230.0 10/31/2017   GLUCOSE 82 01/25/2018   CHOL 202 (H) 10/29/2017   TRIG 76.0 10/29/2017   HDL 47.40 10/29/2017   LDLCALC 139 (H) 10/29/2017   ALT 17 01/25/2018   AST 18 01/25/2018   NA 140 01/25/2018   K 4.2 01/25/2018   CL 107 01/25/2018   CREATININE 0.95 01/25/2018   BUN 15 01/25/2018   CO2 26 01/25/2018   TSH 1.05 10/29/2017    Dg Chest 2  View  Result Date: 08/22/2017 CLINICAL DATA:  Cough and congestion with wheezing and shortness of breath EXAM: CHEST  2 VIEW COMPARISON:  September 07, 2016 and December 27, 2007 FINDINGS: Lungs are clear. The heart size and pulmonary vascularity are normal. No adenopathy. No bone lesions. IMPRESSION: No edema or consolidation. Electronically Signed   By: Lowella Grip III M.D.   On: 08/22/2017 17:24    Assessment & Plan:   Cherelle was seen today for follow-up.  Diagnoses and all orders for this visit:  Essential hypertension -     hydrochlorothiazide (MICROZIDE) 12.5 MG capsule; Take 1 capsule (12.5 mg total) by mouth daily.  Mild intermittent asthma without complication -     montelukast (SINGULAIR) 10 MG tablet; Take 1 tablet (10 mg total) by mouth at bedtime. -     albuterol (VENTOLIN HFA) 108 (90 Base) MCG/ACT inhaler; Inhale 1-2 puffs into the lungs every 6 (six) hours as needed for wheezing or shortness of breath. -     beclomethasone (QVAR REDIHALER) 40 MCG/ACT inhaler; Inhale 1 puff into the lungs 2 (two) times daily. Rinse mouth after each use  Chronic right hip pain -     Diclofenac Sodium (PENNSAID) 2 % SOLN; Place 1 inch onto the skin 2 (two) times daily.   I have changed Samariyah R. Ullmer's VENTOLIN HFA to albuterol. I have also changed her beclomethasone. I am also having her start on Diclofenac Sodium. Additionally, I am having her maintain her Multiple Vitamins-Calcium (ONE-A-DAY WOMENS PO), Cetirizine HCl (ZYRTEC PO), Cholecalciferol (VITAMIN D PO), EPINEPHrine, omeprazole, hydrochlorothiazide, and montelukast.  Meds ordered this encounter  Medications  . hydrochlorothiazide (MICROZIDE) 12.5 MG capsule    Sig: Take 1 capsule (12.5 mg total) by mouth daily.    Dispense:  90 capsule    Refill:  1    Order Specific Question:   Supervising Provider    Answer:   Lucille Passy [[id number]]  . montelukast (SINGULAIR) 10 MG tablet    Sig: Take 1 tablet (10 mg total) by mouth  at bedtime.    Dispense:  90 tablet    Refill:  1    Order Specific Question:   Supervising Provider    Answer:   Lucille Passy [[id number]]  . albuterol (VENTOLIN HFA) 108 (90 Base) MCG/ACT inhaler    Sig: Inhale 1-2 puffs into the lungs every 6 (six) hours as needed for wheezing or shortness of breath.    Dispense:  18 g    Refill:  1    Order Specific Question:   Supervising Provider    Answer:   Lucille Passy [[id number]]  . beclomethasone (QVAR REDIHALER) 40 MCG/ACT inhaler    Sig: Inhale 1 puff into  the lungs 2 (two) times daily. Rinse mouth after each use    Dispense:  10.6 g    Refill:  5    Order Specific Question:   Supervising Provider    Answer:   Lucille Passy [[id number]]  . Diclofenac Sodium (PENNSAID) 2 % SOLN    Sig: Place 1 inch onto the skin 2 (two) times daily.    Dispense:  2 g    Refill:  0    Order Specific Question:   Supervising Provider    Answer:   Lucille Passy [[id number]]    Follow-up: Return in about 3 months (around 06/07/2018) for HTN and asthma.  Wilfred Lacy, NP

## 2018-06-07 ENCOUNTER — Ambulatory Visit: Payer: 59 | Admitting: Nurse Practitioner

## 2018-06-08 ENCOUNTER — Other Ambulatory Visit: Payer: Self-pay | Admitting: Nurse Practitioner

## 2018-06-08 DIAGNOSIS — R12 Heartburn: Secondary | ICD-10-CM

## 2018-06-08 DIAGNOSIS — F458 Other somatoform disorders: Secondary | ICD-10-CM

## 2018-06-13 ENCOUNTER — Ambulatory Visit: Payer: 59 | Admitting: Nurse Practitioner

## 2018-06-13 ENCOUNTER — Encounter: Payer: Self-pay | Admitting: Nurse Practitioner

## 2018-06-13 VITALS — BP 126/90 | HR 69 | Temp 98.6°F | Ht 65.5 in | Wt 248.8 lb

## 2018-06-13 DIAGNOSIS — J4521 Mild intermittent asthma with (acute) exacerbation: Secondary | ICD-10-CM | POA: Diagnosis not present

## 2018-06-13 DIAGNOSIS — E785 Hyperlipidemia, unspecified: Secondary | ICD-10-CM | POA: Diagnosis not present

## 2018-06-13 DIAGNOSIS — K219 Gastro-esophageal reflux disease without esophagitis: Secondary | ICD-10-CM

## 2018-06-13 DIAGNOSIS — Z860100 Personal history of colon polyps, unspecified: Secondary | ICD-10-CM

## 2018-06-13 DIAGNOSIS — M25551 Pain in right hip: Secondary | ICD-10-CM

## 2018-06-13 DIAGNOSIS — I10 Essential (primary) hypertension: Secondary | ICD-10-CM

## 2018-06-13 DIAGNOSIS — M25552 Pain in left hip: Secondary | ICD-10-CM

## 2018-06-13 NOTE — Progress Notes (Signed)
Subjective:  Patient ID: Claire Sutton, female    DOB: 1961/08/15  Age: 57 y.o. MRN: [id number]  CC: Follow-up ( pt sts BP has been stable,  BPs have been 120/70s, pt fasting )   Hip Pain   The incident occurred more than 1 week ago. The incident occurred at the gym. There was no injury mechanism. Pain location: bilateral (R>L) The quality of the pain is described as aching and shooting. The pain is mild. The pain has been intermittent since onset. Pertinent negatives include no inability to bear weight, loss of motion, loss of sensation, muscle weakness, numbness or tingling. The symptoms are aggravated by weight bearing and movement. She has tried rest for the symptoms. The treatment provided significant relief.  onset of pain with increased with new exercise regimen (walking, use of elliptical machine)   HTN: Stable with HCTZ. BP Readings from Last 3 Encounters:  06/13/18 126/90  03/07/18 120/78  01/25/18 (!) 172/98  Exercise: walking 1-51miles a day. Diet:Portion control and low fat.  Reviewed past Medical, Social and Family history today.  Outpatient Medications Prior to Visit  Medication Sig Dispense Refill  . albuterol (VENTOLIN HFA) 108 (90 Base) MCG/ACT inhaler Inhale 1-2 puffs into the lungs every 6 (six) hours as needed for wheezing or shortness of breath. 18 g 1  . beclomethasone (QVAR REDIHALER) 40 MCG/ACT inhaler Inhale 1 puff into the lungs 2 (two) times daily. Rinse mouth after each use 10.6 g 5  . Cetirizine HCl (ZYRTEC PO) Take 1 tablet by mouth daily.     . Cholecalciferol (VITAMIN D PO) Take 1 tablet by mouth daily.     . Diclofenac Sodium (PENNSAID) 2 % SOLN Place 1 inch onto the skin 2 (two) times daily. 2 g 0  . EPINEPHrine 0.3 mg/0.3 mL IJ SOAJ injection use as directed by prescriber  0  . montelukast (SINGULAIR) 10 MG tablet Take 1 tablet (10 mg total) by mouth at bedtime. 90 tablet 1  . Multiple Vitamins-Calcium (ONE-A-DAY WOMENS PO) Take 1 tablet by mouth  daily.     Marland Kitchen omeprazole (PRILOSEC) 20 MG capsule TAKE 1 CAPSULE BY MOUTH ONCE DAILY 90 capsule 3  . hydrochlorothiazide (MICROZIDE) 12.5 MG capsule Take 1 capsule (12.5 mg total) by mouth daily. 90 capsule 1   No facility-administered medications prior to visit.     ROS See HPI  Objective:  BP 126/90   Pulse 69   Temp 98.6 F (37 C) (Oral)   Ht 5' 5.5" (1.664 m)   Wt 248 lb 12.8 oz (112.9 kg)   SpO2 96%   BMI 40.77 kg/m   BP Readings from Last 3 Encounters:  06/13/18 126/90  03/07/18 120/78  01/25/18 (!) 172/98    Wt Readings from Last 3 Encounters:  06/13/18 248 lb 12.8 oz (112.9 kg)  03/07/18 248 lb (112.5 kg)  01/25/18 249 lb 6.4 oz (113.1 kg)    Physical Exam  Constitutional: She is oriented to person, place, and time. She appears well-developed and well-nourished.  Cardiovascular: Normal rate, regular rhythm and normal heart sounds.  Pulmonary/Chest: Effort normal and breath sounds normal. She has no wheezes. She has no rales.  Musculoskeletal: She exhibits tenderness. She exhibits no edema.       Right hip: She exhibits tenderness. She exhibits normal range of motion, normal strength, no bony tenderness and no crepitus.       Left hip: She exhibits tenderness. She exhibits normal range of motion, normal strength, no  bony tenderness, no swelling, no crepitus, no deformity and no laceration.       Right knee: Normal.       Left knee: Normal.       Lumbar back: Normal.       Right upper leg: Normal.       Left upper leg: Normal.  Neurological: She is alert and oriented to person, place, and time.  Skin: No rash noted.  Vitals reviewed.   Lab Results  Component Value Date   WBC 4.0 10/31/2017   HGB 14.6 10/31/2017   HCT 44.7 10/31/2017   PLT 230.0 10/31/2017   GLUCOSE 95 06/13/2018   CHOL 229 (H) 06/13/2018   TRIG 72 06/13/2018   HDL 51 06/13/2018   LDLCALC 164 (H) 06/13/2018   ALT 17 01/25/2018   AST 18 01/25/2018   NA 142 06/13/2018   K 3.9  06/13/2018   CL 103 06/13/2018   CREATININE 1.14 (H) 06/13/2018   BUN 16 06/13/2018   CO2 25 06/13/2018   TSH 1.05 10/29/2017    Dg Chest 2 View  Result Date: 08/22/2017 CLINICAL DATA:  Cough and congestion with wheezing and shortness of breath EXAM: CHEST  2 VIEW COMPARISON:  September 07, 2016 and December 27, 2007 FINDINGS: Lungs are clear. The heart size and pulmonary vascularity are normal. No adenopathy. No bone lesions. IMPRESSION: No edema or consolidation. Electronically Signed   By: Lowella Grip III M.D.   On: 08/22/2017 17:24    Assessment & Plan:   Claire Sutton was seen today for follow-up.  Diagnoses and all orders for this visit:  Essential hypertension -     Basic metabolic panel -     lisinopril (PRINIVIL,ZESTRIL) 5 MG tablet; Take 1 tablet (5 mg total) by mouth daily.  Dyslipidemia -     Lipid panel  Acute pain of both hips  Mild intermittent asthma with acute exacerbation  Gastroesophageal reflux disease, esophagitis presence not specified  History of colon polyps   I have discontinued Claire Sutton's hydrochlorothiazide. I am also having her start on lisinopril. Additionally, I am having her maintain her Multiple Vitamins-Calcium (ONE-A-DAY WOMENS PO), Cetirizine HCl (ZYRTEC PO), Cholecalciferol (VITAMIN D PO), EPINEPHrine, montelukast, albuterol, beclomethasone, Diclofenac Sodium, and omeprazole.  Meds ordered this encounter  Medications  . lisinopril (PRINIVIL,ZESTRIL) 5 MG tablet    Sig: Take 1 tablet (5 mg total) by mouth daily.    Dispense:  30 tablet    Refill:  5    Order Specific Question:   Supervising Provider    Answer:   Claire Sutton [[id number]]    Follow-up: Return in about 6 months (around 12/14/2018) for CPE(fasting).  Claire Lacy, NP

## 2018-06-13 NOTE — Patient Instructions (Addendum)
Go to lab for blood draw. Continue current medication.  Schedule for repeat colonoscopy with [facility]  Stretch before and after exercise. Apply cold compress to hip joint after stretch and exercise.  Iliotibial Band Syndrome Rehab Ask your health care provider which exercises are safe for you. Do exercises exactly as told by your health care provider and adjust them as directed. It is normal to feel mild stretching, pulling, tightness, or discomfort as you do these exercises, but you should stop right away if you feel sudden pain or your pain gets worse.Do not begin these exercises until told by your health care provider. Stretching and range of motion exercises These exercises warm up your muscles and joints and improve the movement and flexibility of your hip and pelvis. Exercise A: Quadriceps, prone  1. Lie on your abdomen on a firm surface, such as a bed or padded floor. 2. Bend your left / right knee and hold your ankle. If you cannot reach your ankle or pant leg, loop a belt around your foot and grab the belt instead. 3. Gently pull your heel toward your buttocks. Your knee should not slide out to the side. You should feel a stretch in the front of your thigh and knee. 4. Hold this position for ____20______ seconds. Repeat ____3______ times. Complete this stretch _____2_____ times a day. Exercise B: Iliotibial band  1. Lie on your side with your left / right leg in the top position. 2. Bend both of your knees and grab your left / right ankle. Stretch out your bottom arm to help you balance. 3. Slowly bring your top knee back so your thigh goes behind your trunk. 4. Slowly lower your top leg toward the floor until you feel a gentle stretch on the outside of your left / right hip and thigh. If you do not feel a stretch and your knee will not fall farther, place the heel of your other foot on top of your knee and pull your knee down toward the floor with your foot. 5. Hold this position  for __________ seconds. Repeat __________ times. Complete this stretch __________ times a day. Strengthening exercises These exercises build strength and endurance in your hip and pelvis. Endurance is the ability to use your muscles for a long time, even after they get tired. Exercise C: Straight leg raises ( hip abductors) 1. Lie on your side with your left / right leg in the top position. Lie so your head, shoulder, knee, and hip line up. You may bend your bottom knee to help you balance. 2. Roll your hips slightly forward so your hips are stacked directly over each other and your left / right knee is facing forward. 3. Tense the muscles in your outer thigh and lift your top leg 4-6 inches (10-15 cm). 4. Hold this position for __________ seconds. 5. Slowly return to the starting position. Let your muscles relax completely before doing another repetition. Repeat __________ times. Complete this exercise __________ times a day. Exercise D: Straight leg raises ( hip extensors) 1. Lie on your abdomen on your bed or a firm surface. You can put a pillow under your hips if that is more comfortable. 2. Bend your left / right knee so your foot is straight up in the air. 3. Squeeze your buttock muscles and lift your left / right thigh off the bed. Do not let your back arch. 4. Tense this muscle as hard as you can without increasing any knee pain. 5.  Hold this position for __________ seconds. 6. Slowly lower your leg to the starting position and allow it to relax completely. Repeat __________ times. Complete this exercise __________ times a day. Exercise E: Hip hike 1. Stand sideways on a bottom step. Stand on your left / right leg with your other foot unsupported next to the step. You can hold onto the railing or wall if needed for balance. 2. Keep your knees straight and your torso square. Then, lift your left / right hip up toward the ceiling. 3. Slowly let your left / right hip lower toward the  floor, past the starting position. Your foot should get closer to the floor. Do not lean or bend your knees. Repeat __________ times. Complete this exercise __________ times a day. This information is not intended to replace advice given to you by your health care provider. Make sure you discuss any questions you have with your health care provider. Document Released: 10/23/2005 Document Revised: 06/27/2016 Document Reviewed: 09/24/2015 Elsevier Interactive Patient Education  2018 [place].   Hip Exercises Ask your health care provider which exercises are safe for you. Do exercises exactly as told by your health care provider and adjust them as directed. It is normal to feel mild stretching, pulling, tightness, or discomfort as you do these exercises, but you should stop right away if you feel sudden pain or your pain gets worse.Do not begin these exercises until told by your health care provider. STRETCHING AND RANGE OF MOTION EXERCISES These exercises warm up your muscles and joints and improve the movement and flexibility of your hip. These exercises also help to relieve pain, numbness, and tingling. Exercise A: Hamstrings, Supine  1. Lie on your back. 2. Loop a belt or towel over the ball of your left / rightfoot. The ball of your foot is on the walking surface, right under your toes. 3. Straighten your left / rightknee and slowly pull on the belt to raise your leg. ? Do not let your left / right knee bend while you do this. ? Keep your other leg flat on the floor. ? Raise the left / right leg until you feel a gentle stretch behind your left / right knee or thigh. 4. Hold this position for __________ seconds. 5. Slowly return your leg to the starting position. Repeat __________ times. Complete this stretch __________ times a day. Exercise B: Hip Rotators  1. Lie on your back on a firm surface. 2. Hold your left / right knee with your left / right hand. Hold your ankle with your  other hand. 3. Gently pull your left / right knee and rotate your lower leg toward your other shoulder. ? Pull until you feel a stretch in your buttocks. ? Keep your hips and shoulders firmly planted while you do this stretch. 4. Hold this position for __________ seconds. Repeat __________ times. Complete this stretch __________ times a day. Exercise C: V-Sit (Hamstrings and Adductors)  6. Sit on the floor with your legs extended in a large "V" shape. Keep your knees straight during this exercise. 7. Start with your head and chest upright, then bend at your waist to reach for your left foot (position A). You should feel a stretch in your right inner thigh. 8. Hold this position for __________ seconds. Then slowly return to the upright position. 9. Bend at your waist to reach forward (position B). You should feel a stretch behind both of your thighs and knees. 10. Hold this position for  __________ seconds. Then slowly return to the upright position. 11. Bend at your waist to reach for your right foot (position C). You should feel a stretch in your left inner thigh. 12. Hold this position for __________ seconds. Then slowly return to the upright position. Repeat __________ times. Complete this stretch __________ times a day. Exercise D: Lunge (Hip Flexors)  7. Place your left / right knee on the floor and bend your other knee so that is directly over your ankle. You should be half-kneeling. 8. Keep good posture with your head over your shoulders. 9. Tighten your buttocks to point your tailbone downward. This helps your back to keep from arching too much. 10. You should feel a gentle stretch in the front of your left / right thigh and hip. If you do not feel any resistance, slightly slide your other foot forward and then slowly lunge forward so your knee once again lines up over your ankle. 11. Make sure your tailbone continues to point downward. 12. Hold this position for __________  seconds. Repeat __________ times. Complete this stretch __________ times a day. STRENGTHENING EXERCISES These exercises build strength and endurance in your hip. Endurance is the ability to use your muscles for a long time, even after they get tired. Exercise E: Bridge (Hip Extensors)  1. Lie on your back on a firm surface with your knees bent and your feet flat on the floor. 2. Tighten your buttocks muscles and lift your bottom off the floor until the trunk of your body is level with your thighs. ? Do not arch your back. ? You should feel the muscles working in your buttocks and the back of your thighs. If you do not feel these muscles, slide your feet 1-2 inches (2.5-5 cm) farther away from your buttocks. 3. Hold this position for __________ seconds. 4. Slowly lower your hips to the starting position. 5. Let your muscles relax completely between repetitions. 6. If this exercise is too easy, try doing it with your arms crossed over your chest. Repeat __________ times. Complete this exercise __________ times a day. Exercise F: Straight Leg Raises - Hip Abductors  1. Lie on your side with your left / right leg in the top position. Lie so your head, shoulder, knee, and hip line up with each other. You may bend your bottom knee to help you balance. 2. Roll your hips slightly forward, so your hips are stacked directly over each other and your left / right knee is facing forward. 3. Leading with your heel, lift your top leg 4-6 inches (10-15 cm). You should feel the muscles in your outer hip lifting. ? Do not let your foot drift forward. ? Do not let your knee roll toward the ceiling. 4. Hold this position for __________ seconds. 5. Slowly return to the starting position. 6. Let your muscles relax completely between repetitions. Repeat __________ times. Complete this exercise __________ times a day. Exercise G: Straight Leg Raises - Hip Adductors  1. Lie on your side with your left / right leg  in the bottom position. Lie so your head, shoulder, knee, and hip line up. You may place your upper foot in front to help you balance. 2. Roll your hips slightly forward, so your hips are stacked directly over each other and your left / right knee is facing forward. 3. Tense the muscles in your inner thigh and lift your bottom leg 4-6 inches (10-15 cm). 4. Hold this position for __________ seconds. 5. Slowly  return to the starting position. 6. Let your muscles relax completely between repetitions. Repeat __________ times. Complete this exercise __________ times a day. Exercise H: Straight Leg Raises - Quadriceps  1. Lie on your back with your left / right leg extended and your other knee bent. 2. Tense the muscles in the front of your left / right thigh. When you do this, you should see your kneecap slide up or see increased dimpling just above your knee. 3. Tighten these muscles even more and raise your leg 4-6 inches (10-15 cm) off the floor. 4. Hold this position for __________ seconds. 5. Keep these muscles tense as you lower your leg. 6. Relax the muscles slowly and completely between repetitions. Repeat __________ times. Complete this exercise __________ times a day. Exercise I: Hip Abductors, Standing 1. Tie one end of a rubber exercise band or tubing to a secure surface, such as a table or pole. 2. Loop the other end of the band or tubing around your left / right ankle. 3. Keeping your ankle with the band or tubing directly opposite of the secured end, step away until there is tension in the tubing or band. Hold onto a chair as needed for balance. 4. Lift your left / right leg out to your side. While you do this: ? Keep your back upright. ? Keep your shoulders over your hips. ? Keep your toes pointing forward. ? Make sure to use your hip muscles to lift your leg. Do not "throw" your leg or tip your body to lift your leg. 5. Hold this position for __________ seconds. 6. Slowly return  to the starting position. Repeat __________ times. Complete this exercise __________ times a day. Exercise J: Squats (Quadriceps) 1. Stand in a door frame so your feet and knees are in line with the frame. You may place your hands on the frame for balance. 2. Slowly bend your knees and lower your hips like you are going to sit in a chair. ? Keep your lower legs in a straight-up-and-down position. ? Do not let your hips go lower than your knees. ? Do not bend your knees lower than told by your health care provider. ? If your hip pain increases, do not bend as low. 3. Hold this position for ___________ seconds. 4. Slowly push with your legs to return to standing. Do not use your hands to pull yourself to standing. Repeat __________ times. Complete this exercise __________ times a day. This information is not intended to replace advice given to you by your health care provider. Make sure you discuss any questions you have with your health care provider. Document Released: 11/10/2005 Document Revised: 07/17/2016 Document Reviewed: 10/18/2015 Elsevier Interactive Patient Education  [employer].

## 2018-06-14 LAB — BASIC METABOLIC PANEL
BUN / CREAT RATIO: 14 (ref 9–23)
BUN: 16 mg/dL (ref 6–24)
CO2: 25 mmol/L (ref 20–29)
CREATININE: 1.14 mg/dL — AB (ref 0.57–1.00)
Calcium: 9 mg/dL (ref 8.7–10.2)
Chloride: 103 mmol/L (ref 96–106)
GFR calc non Af Amer: 53 mL/min/{1.73_m2} — ABNORMAL LOW (ref >59)
GFR, EST AFRICAN AMERICAN: 61 mL/min/{1.73_m2} (ref >59)
Glucose: 95 mg/dL (ref 65–99)
Potassium: 3.9 mmol/L (ref 3.5–5.2)
Sodium: 142 mmol/L (ref 134–144)

## 2018-06-14 LAB — LIPID PANEL
CHOLESTEROL TOTAL: 229 mg/dL — AB (ref 100–199)
Chol/HDL Ratio: 4.5 ratio — ABNORMAL HIGH (ref 0.0–4.4)
HDL: 51 mg/dL (ref >39)
LDL CALC: 164 mg/dL — AB (ref 0–99)
Triglycerides: 72 mg/dL (ref 0–149)
VLDL CHOLESTEROL CAL: 14 mg/dL (ref 5–40)

## 2018-06-14 MED ORDER — LISINOPRIL 5 MG PO TABS: 5.0000 mg | ORAL_TABLET | Freq: Every day | ORAL | 5 refills | Status: DC

## 2018-06-14 NOTE — Assessment & Plan Note (Signed)
Stable with omeprazole

## 2018-06-14 NOTE — Assessment & Plan Note (Signed)
>>  ASSESSMENT AND PLAN FOR REACTIVE AIRWAY DISEASE WRITTEN ON 06/14/2018  9:51 AM BY Kolten Ryback LUM, NP  Controlled with Qvar and albuterol

## 2018-06-14 NOTE — Assessment & Plan Note (Signed)
Will schedule with [facility]

## 2018-06-14 NOTE — Assessment & Plan Note (Signed)
Controlled with Qvar and albuterol

## 2018-06-14 NOTE — Assessment & Plan Note (Signed)
Elevated TC and LDL. Continue to monitor. Encourage diet changes and regular exercise at this time. Lipid Panel     Component Value Date/Time   CHOL 229 (H) 06/13/2018 0904   TRIG 72 06/13/2018 0904   HDL 51 06/13/2018 0904   CHOLHDL 4.5 (H) 06/13/2018 0904   CHOLHDL 4 10/29/2017 1014   VLDL 15.2 10/29/2017 1014   LDLCALC 164 (H) 06/13/2018 5183

## 2018-06-14 NOTE — Assessment & Plan Note (Signed)
Improved BP with HCTZ but decline in renal function. D/c HCTZ and start lisinopril. BP Readings from Last 3 Encounters:  06/13/18 126/90  03/07/18 120/78  01/25/18 (!) 172/98   BMP Latest Ref Rng & Units 06/13/2018 01/25/2018 10/29/2017  Glucose 65 - 99 mg/dL 95 82 93  BUN 6 - 24 mg/dL 16 15 12   Creatinine 0.57 - 1.00 mg/dL 1.14(H) 0.95 0.92  BUN/Creat Ratio 9 - 23 14 NOT APPLICABLE -  Sodium 470 - 144 mmol/L 142 140 139  Potassium 3.5 - 5.2 mmol/L 3.9 4.2 4.2  Chloride 96 - 106 mmol/L 103 107 106  CO2 20 - 29 mmol/L 25 26 28   Calcium 8.7 - 10.2 mg/dL 9.0 8.8 9.1

## 2018-06-17 ENCOUNTER — Telehealth: Payer: Self-pay

## 2018-06-17 NOTE — Telephone Encounter (Signed)
Pt called office concerned that taking lisinopril would due harm to her kidneys. I spoke with Baldo Ash to inform her that pt was concerned,  Baldo Ash wanted me to inform her  this is the best medication to control her HTN. Her other option would be amplodipine which would cause swelling in her ankles. Baldo Ash also advised that stopping medication would not be recommended at this time since her HTN is controlled. Pt verbalized understanding and agrees to continue taking lisinopril and will note any changes.

## 2018-06-27 ENCOUNTER — Telehealth: Payer: Self-pay | Admitting: Nurse Practitioner

## 2018-06-27 DIAGNOSIS — I10 Essential (primary) hypertension: Secondary | ICD-10-CM

## 2018-06-27 MED ORDER — LISINOPRIL 10 MG PO TABS: 10.0000 mg | ORAL_TABLET | Freq: Every day | ORAL | 1 refills | Status: DC

## 2018-06-27 NOTE — Telephone Encounter (Signed)
Spoke with the pt, she started this med on 06/15/18 and her BP was fine for 1 week. Since 06/23/18, she started to notice her BP getting higher 06/23/18 BP 140/73  06/26/18 BP 140/86,134/85,181/95,172/93,128/75 (through out the day)  06/27/2018 BP 128/78, 150/77 (30 min apart)  Pt taking this med at 5 pm because she take zyrtec otc in the morning.    Copied from [place] [phone]. Topic: General - Other >> Jun 27, 2018  8:40 AM Carolyn Stare wrote:  Pt said the following BP med is not working for her. lisinopril (PRINIVIL,ZESTRIL) 5 MG tablet   she said her bp seem to be going up since she has been taking the med

## 2018-06-27 NOTE — Telephone Encounter (Signed)
Pt verbalized understand of message below.

## 2018-07-05 ENCOUNTER — Telehealth: Payer: Self-pay | Admitting: Nurse Practitioner

## 2018-07-05 NOTE — Telephone Encounter (Signed)
Pt is aware appt made

## 2018-07-05 NOTE — Telephone Encounter (Signed)
Pt report first 4 days of started med her BP is running about 117/74. Around 07/01/18 it was 185/98 and Tuesday in the morning 140-150 systaltic and at night 126/83. It is go up and down after she take her med.     Copied from [place] [id number]. Topic: General - Other >> Jul 05, 2018 11:38 AM Judyann Munson wrote: Reason for CRM: Pt  is calling to update the following BP med is kind of  working for her. lisinopril (PRINIVIL,ZESTRIL) 10 MG tablet   she said her bp seem to be going down.  Patient stated After the 5th day her BP seem to went up.  She take the pill at 5pm she stated her BP goes up and then down after taking to pill.  She also stated in the morning to seems to be high. Please advise

## 2018-07-05 NOTE — Telephone Encounter (Signed)
Maintain current dose of medication. Check BP once a day in morning and record. Make appt to be seen in 1week. Bring BP readings.

## 2018-07-12 ENCOUNTER — Other Ambulatory Visit: Payer: Self-pay | Admitting: Nurse Practitioner

## 2018-07-12 ENCOUNTER — Encounter: Payer: Self-pay | Admitting: Nurse Practitioner

## 2018-07-12 ENCOUNTER — Ambulatory Visit: Payer: 59 | Admitting: Nurse Practitioner

## 2018-07-12 VITALS — BP 132/86 | HR 64 | Temp 98.3°F | Ht 65.5 in | Wt 243.0 lb

## 2018-07-12 DIAGNOSIS — J4520 Mild intermittent asthma, uncomplicated: Secondary | ICD-10-CM | POA: Diagnosis not present

## 2018-07-12 DIAGNOSIS — R12 Heartburn: Secondary | ICD-10-CM

## 2018-07-12 DIAGNOSIS — I10 Essential (primary) hypertension: Secondary | ICD-10-CM | POA: Diagnosis not present

## 2018-07-12 DIAGNOSIS — G8929 Other chronic pain: Secondary | ICD-10-CM | POA: Diagnosis not present

## 2018-07-12 DIAGNOSIS — M25551 Pain in right hip: Secondary | ICD-10-CM

## 2018-07-12 DIAGNOSIS — F458 Other somatoform disorders: Secondary | ICD-10-CM

## 2018-07-12 LAB — BASIC METABOLIC PANEL WITH GFR
BUN: 10 mg/dL (ref 6–23)
CO2: 28 meq/L (ref 19–32)
Calcium: 9.2 mg/dL (ref 8.4–10.5)
Chloride: 105 meq/L (ref 96–112)
Creatinine, Ser: 0.95 mg/dL (ref 0.40–1.20)
GFR: 77.58 mL/min (ref >60.00)
Glucose, Bld: 78 mg/dL (ref 70–99)
Potassium: 3.9 meq/L (ref 3.5–5.1)
Sodium: 139 meq/L (ref 135–145)

## 2018-07-12 MED ORDER — ALBUTEROL SULFATE HFA 108 (90 BASE) MCG/ACT IN AERS: 1.0000 | INHALATION_SPRAY | Freq: Four times a day (QID) | RESPIRATORY_TRACT | 1 refills | Status: DC | PRN

## 2018-07-12 MED ORDER — DICLOFENAC SODIUM 2 % TD SOLN: 1.0000 [in_us] | Freq: Two times a day (BID) | TRANSDERMAL | 1 refills | Status: DC

## 2018-07-12 MED ORDER — LOSARTAN POTASSIUM 50 MG PO TABS: 50.0000 mg | ORAL_TABLET | Freq: Every day | ORAL | 3 refills | Status: DC

## 2018-07-12 NOTE — Progress Notes (Signed)
Subjective:  Patient ID: Claire Sutton, female    DOB: 05-Mar-1961  Age: 57 y.o. MRN: [id number]  CC: Follow-up (BP follow up/has reading with her--since start lisinopril gets dull headache and has to clear throat more often. med refills? albuterol and pennsaid?/ FYI: colonoscopy scheduled this month. )  HPI  HTN: Home BP readings: 120s/70s-130s/80s. Takes Lisinopril at 5pm daily. Reports elevated BP in morning 2weeks ago, symptomatic with lightheadedness, headache and fatigue. This has since resolved with increased lisinopril dose. Persistent headache, throat clearing and cough since started on lisinopril. Reports adequate oral hydration and DASH diet. Reports poor sleep due to increase stress at home (conflict with daughter in law). She denies any acute symptoms today.  Reviewed past Medical, Social and Family history today.  Outpatient Medications Prior to Visit  Medication Sig Dispense Refill  . beclomethasone (QVAR REDIHALER) 40 MCG/ACT inhaler Inhale 1 puff into the lungs 2 (two) times daily. Rinse mouth after each use 10.6 g 5  . Cetirizine HCl (ZYRTEC PO) Take 1 tablet by mouth daily.     . Cholecalciferol (VITAMIN D PO) Take 1 tablet by mouth daily.     Marland Kitchen EPINEPHrine 0.3 mg/0.3 mL IJ SOAJ injection use as directed by prescriber  0  . montelukast (SINGULAIR) 10 MG tablet Take 1 tablet (10 mg total) by mouth at bedtime. 90 tablet 1  . Multiple Vitamins-Calcium (ONE-A-DAY WOMENS PO) Take 1 tablet by mouth daily.     Marland Kitchen omeprazole (PRILOSEC) 20 MG capsule TAKE 1 CAPSULE BY MOUTH ONCE DAILY 90 capsule 3  . albuterol (VENTOLIN HFA) 108 (90 Base) MCG/ACT inhaler Inhale 1-2 puffs into the lungs every 6 (six) hours as needed for wheezing or shortness of breath. 18 g 1  . Diclofenac Sodium (PENNSAID) 2 % SOLN Place 1 inch onto the skin 2 (two) times daily. 2 g 0  . lisinopril (PRINIVIL,ZESTRIL) 10 MG tablet Take 1 tablet (10 mg total) by mouth daily. 90 tablet 1   No  facility-administered medications prior to visit.     ROS See HPI  Objective:  BP 132/86   Pulse 64   Temp 98.3 F (36.8 C) (Oral)   Ht 5' 5.5" (1.664 m)   Wt 243 lb (110.2 kg)   SpO2 99%   BMI 39.82 kg/m   BP Readings from Last 3 Encounters:  07/12/18 132/86  06/13/18 126/90  03/07/18 120/78   Wt Readings from Last 3 Encounters:  07/12/18 243 lb (110.2 kg)  06/13/18 248 lb 12.8 oz (112.9 kg)  03/07/18 248 lb (112.5 kg)   Physical Exam  Constitutional: She is oriented to person, place, and time.  Cardiovascular: Normal rate.  Musculoskeletal: She exhibits no edema.  Neurological: She is alert and oriented to person, place, and time.  Vitals reviewed.  Lab Results  Component Value Date   WBC 4.0 10/31/2017   HGB 14.6 10/31/2017   HCT 44.7 10/31/2017   PLT 230.0 10/31/2017   GLUCOSE 78 07/12/2018   CHOL 229 (H) 06/13/2018   TRIG 72 06/13/2018   HDL 51 06/13/2018   LDLCALC 164 (H) 06/13/2018   ALT 17 01/25/2018   AST 18 01/25/2018   NA 139 07/12/2018   K 3.9 07/12/2018   CL 105 07/12/2018   CREATININE 0.95 07/12/2018   BUN 10 07/12/2018   CO2 28 07/12/2018   TSH 1.05 10/29/2017    Dg Chest 2 View  Result Date: 08/22/2017 CLINICAL DATA:  Cough and congestion with wheezing and shortness of  breath EXAM: CHEST  2 VIEW COMPARISON:  September 07, 2016 and December 27, 2007 FINDINGS: Lungs are clear. The heart size and pulmonary vascularity are normal. No adenopathy. No bone lesions. IMPRESSION: No edema or consolidation. Electronically Signed   By: Lowella Grip III M.D.   On: 08/22/2017 17:24    Assessment & Plan:   Rekha was seen today for follow-up.  Diagnoses and all orders for this visit:  Essential hypertension -     Basic metabolic panel -     losartan (COZAAR) 50 MG tablet; Take 1 tablet (50 mg total) by mouth daily.  Chronic right hip pain -     Diclofenac Sodium (PENNSAID) 2 % SOLN; Place 1 inch onto the skin 2 (two) times daily.  Mild  intermittent asthma without complication -     albuterol (VENTOLIN HFA) 108 (90 Base) MCG/ACT inhaler; Inhale 1-2 puffs into the lungs every 6 (six) hours as needed for wheezing or shortness of breath.   I have discontinued Yilia R. Fedele's lisinopril. I am also having her start on losartan. Additionally, I am having her maintain her Multiple Vitamins-Calcium (ONE-A-DAY WOMENS PO), Cetirizine HCl (ZYRTEC PO), Cholecalciferol (VITAMIN D PO), EPINEPHrine, montelukast, beclomethasone, omeprazole, Diclofenac Sodium, and albuterol.  Meds ordered this encounter  Medications  . losartan (COZAAR) 50 MG tablet    Sig: Take 1 tablet (50 mg total) by mouth daily.    Dispense:  30 tablet    Refill:  3    Order Specific Question:   Supervising Provider    Answer:   MATTHEWS, CODY [[id number]]  . Diclofenac Sodium (PENNSAID) 2 % SOLN    Sig: Place 1 inch onto the skin 2 (two) times daily.    Dispense:  112 g    Refill:  1    Order Specific Question:   Supervising Provider    Answer:   MATTHEWS, CODY [[id number]]  . albuterol (VENTOLIN HFA) 108 (90 Base) MCG/ACT inhaler    Sig: Inhale 1-2 puffs into the lungs every 6 (six) hours as needed for wheezing or shortness of breath.    Dispense:  18 g    Refill:  1    Follow-up: Return in about 3 months (around 10/11/2018) for HTN.  Wilfred Lacy, NP

## 2018-07-12 NOTE — Patient Instructions (Addendum)
GO TO LAB FOR BLOOD DRAW STOP LISINOPRIL DUE TO THROAT CLEARING AND HEADACHE. START LOSARTAN TONIGHT.  TAKE OMEPRAZOLE 40MG  X 1WEEK, THEN DECREASE TO 20MG    CONTINUE TO CHECK BP ONCE A DAY AND RECORD. Send readings for next 1week via mychart.  Will consider sleep study if persistent waxing and waning of BP?

## 2018-07-16 ENCOUNTER — Encounter: Payer: Self-pay | Admitting: Nurse Practitioner

## 2018-07-19 ENCOUNTER — Telehealth: Payer: Self-pay | Admitting: Nurse Practitioner

## 2018-07-19 NOTE — Telephone Encounter (Signed)
Please advise.    Copied from [place] [id number]. Topic: General - Other >> Jul 19, 2018 10:31 AM Leward Quan A wrote: Reason for CRM:  Patient called to report BP for the week   (07/13/18 126/79)   (8th 138/84)  (9th 133/79 ) (10th 135/87)  (11th 137/88)  (12th 139/84) and the 13th 133/84  Patient would like a call back to know if she should keep checking Blood Pressure daily. Ph # [phone]

## 2018-07-19 NOTE — Telephone Encounter (Signed)
Pt is aware.  

## 2018-07-19 NOTE — Telephone Encounter (Signed)
BP looks stable. Continue medication. Check BP only 2-3times a week.

## 2018-07-22 ENCOUNTER — Telehealth: Payer: Self-pay | Admitting: Nurse Practitioner

## 2018-07-22 DIAGNOSIS — Z1211 Encounter for screening for malignant neoplasm of colon: Secondary | ICD-10-CM

## 2018-07-22 DIAGNOSIS — Z86010 Personal history of colonic polyps: Secondary | ICD-10-CM

## 2018-07-22 NOTE — Telephone Encounter (Signed)
Referral entered. Pt is aware.    Copied from [place] [id number]. Topic: Referral - Request >> Jul 22, 2018  2:43 PM Scherrie Gerlach wrote: Reason for CRM: pt is requesting a referral for a colonoscopy.  Pt went to [facility] in the past and they are wanting her to pay $1600.00 because they found a polyp 5 years ago. They are telling pt this is a diagnostic colonoscopy, but pt states this is supposed to be routine. Pt would like to go to [facility] if you can refer.

## 2018-08-06 ENCOUNTER — Other Ambulatory Visit: Payer: Self-pay | Admitting: Gynecology

## 2018-08-06 DIAGNOSIS — Z1231 Encounter for screening mammogram for malignant neoplasm of breast: Secondary | ICD-10-CM

## 2018-08-07 ENCOUNTER — Encounter: Payer: Self-pay | Admitting: Gastroenterology

## 2018-08-13 ENCOUNTER — Ambulatory Visit (HOSPITAL_BASED_OUTPATIENT_CLINIC_OR_DEPARTMENT_OTHER): Payer: 59

## 2018-08-17 ENCOUNTER — Ambulatory Visit (HOSPITAL_BASED_OUTPATIENT_CLINIC_OR_DEPARTMENT_OTHER): Payer: 59

## 2018-08-17 ENCOUNTER — Ambulatory Visit (HOSPITAL_BASED_OUTPATIENT_CLINIC_OR_DEPARTMENT_OTHER)
Admission: RE | Admit: 2018-08-17 | Discharge: 2018-08-17 | Disposition: A | Discharge status: Home / Self Care | Payer: 59 | Source: Ambulatory Visit | Attending: Gynecology | Admitting: Gynecology

## 2018-08-17 DIAGNOSIS — Z1231 Encounter for screening mammogram for malignant neoplasm of breast: Secondary | ICD-10-CM | POA: Insufficient documentation

## 2018-09-09 ENCOUNTER — Ambulatory Visit (AMBULATORY_SURGERY_CENTER): Payer: Self-pay

## 2018-09-09 VITALS — Ht 66.0 in | Wt 236.6 lb

## 2018-09-09 DIAGNOSIS — Z86010 Personal history of colonic polyps: Secondary | ICD-10-CM

## 2018-09-09 NOTE — Progress Notes (Signed)
Denies allergies to eggs or soy products. Denies complication of anesthesia or sedation. Denies use of weight loss medication. Denies use of O2.   Emmi instructions declined.  

## 2018-09-10 ENCOUNTER — Encounter: Payer: Self-pay | Admitting: Gastroenterology

## 2018-09-23 ENCOUNTER — Ambulatory Visit (AMBULATORY_SURGERY_CENTER): Payer: 59 | Admitting: Gastroenterology

## 2018-09-23 ENCOUNTER — Encounter: Payer: Self-pay | Admitting: Gastroenterology

## 2018-09-23 VITALS — BP 119/83 | HR 53 | Temp 98.4°F | Resp 14 | Ht 66.0 in | Wt 236.0 lb

## 2018-09-23 DIAGNOSIS — D125 Benign neoplasm of sigmoid colon: Secondary | ICD-10-CM

## 2018-09-23 DIAGNOSIS — Z86010 Personal history of colonic polyps: Secondary | ICD-10-CM

## 2018-09-23 DIAGNOSIS — K635 Polyp of colon: Secondary | ICD-10-CM

## 2018-09-23 MED ORDER — SODIUM CHLORIDE 0.9 % IV SOLN: 500.0000 mL | Freq: Once | INTRAVENOUS | Status: DC

## 2018-09-23 NOTE — Patient Instructions (Signed)
Handout given on polyps  YOU HAD AN ENDOSCOPIC PROCEDURE TODAY AT THE [facility]:   Refer to the procedure report that was given to you for any specific questions about what was found during the examination.  If the procedure report does not answer your questions, please call your gastroenterologist to clarify.  If you requested that your care partner not be given the details of your procedure findings, then the procedure report has been included in a sealed envelope for you to review at your convenience later.  YOU SHOULD EXPECT: Some feelings of bloating in the abdomen. Passage of more gas than usual.  Walking can help get rid of the air that was put into your GI tract during the procedure and reduce the bloating. If you had a lower endoscopy (such as a colonoscopy or flexible sigmoidoscopy) you may notice spotting of blood in your stool or on the toilet paper. If you underwent a bowel prep for your procedure, you may not have a normal bowel movement for a few days.  Please Note:  You might notice some irritation and congestion in your nose or some drainage.  This is from the oxygen used during your procedure.  There is no need for concern and it should clear up in a day or so.  SYMPTOMS TO REPORT IMMEDIATELY:   Following lower endoscopy (colonoscopy or flexible sigmoidoscopy):  Excessive amounts of blood in the stool  Significant tenderness or worsening of abdominal pains  Swelling of the abdomen that is new, acute  Fever of 100F or higher    For urgent or emergent issues, a gastroenterologist can be reached at any hour by calling [phone].   DIET:  We do recommend a small meal at first, but then you may proceed to your regular diet.  Drink plenty of fluids but you should avoid alcoholic beverages for 24 hours.  ACTIVITY:  You should plan to take it easy for the rest of today and you should NOT DRIVE or use heavy machinery until tomorrow (because of the sedation  medicines used during the test).    FOLLOW UP: Our staff will call the number listed on your records the next business day following your procedure to check on you and address any questions or concerns that you may have regarding the information given to you following your procedure. If we do not reach you, we will leave a message.  However, if you are feeling well and you are not experiencing any problems, there is no need to return our call.  We will assume that you have returned to your regular daily activities without incident.  If any biopsies were taken you will be contacted by phone or by letter within the next 1-3 weeks.  Please call us at [phone] if you have not heard about the biopsies in 3 weeks.    SIGNATURES/CONFIDENTIALITY: You and/or your care partner have signed paperwork which will be entered into your electronic medical record.  These signatures attest to the fact that that the information above on your After Visit Summary has been reviewed and is understood.  Full responsibility of the confidentiality of this discharge information lies with you and/or your care-partner. 

## 2018-09-23 NOTE — Op Note (Signed)
[place] Patient Name: Claire Sutton Procedure Date: 09/23/2018 8:56 AM MRN: [id number] Endoscopist: Thornton Park MD, MD Age: 57 Referring MD:  Date of Birth: 02/27/1961 Gender: Female Account #: [id number] Procedure:                Colonoscopy Indications:              Surveillance: Personal history of adenomatous                            polyps on last colonoscopy 5 years ago. Last                            colonoscopy with Dr. Paulita Fujita 06/02/13 with a 2mm                            tubular adenoma removed from the descending colon.                            No baseline GI symptoms. Medicines:                See the Anesthesia note for documentation of the                            administered medications Procedure:                Pre-Anesthesia Assessment:                           - Prior to the procedure, a History and Physical                            was performed, and patient medications and                            allergies were reviewed. The patient's tolerance of                            previous anesthesia was also reviewed. The risks                            and benefits of the procedure and the sedation                            options and risks were discussed with the patient.                            All questions were answered, and informed consent                            was obtained. Prior Anticoagulants: The patient has                            taken no previous anticoagulant or antiplatelet  agents. ASA Grade Assessment: II - A patient with                            mild systemic disease. After reviewing the risks                            and benefits, the patient was deemed in                            satisfactory condition to undergo the procedure.                           After obtaining informed consent, the colonoscope                            was passed under direct vision. Throughout  the                            procedure, the patient's blood pressure, pulse, and                            oxygen saturations were monitored continuously. The                            Colonoscope was introduced through the anus and                            advanced to the the terminal ileum, with                            identification of the appendiceal orifice and IC                            valve. The colonoscopy was performed without                            difficulty. The patient tolerated the procedure                            well. The quality of the bowel preparation was good. Scope In: 9:04:21 AM Scope Out: 9:17:20 AM Scope Withdrawal Time: 0 hours 9 minutes 2 seconds  Total Procedure Duration: 0 hours 12 minutes 59 seconds  Findings:                 The perianal and digital rectal examinations were                            normal.                           A 2 mm polyp was found in the distal sigmoid colon.                            The polyp was sessile. The polyp was removed  with a                            cold biopsy forceps. Resection and retrieval were                            complete.                           The exam was otherwise without abnormality on                            direct and retroflexion views. Some internal                            hemorrhoids. Complications:            No immediate complications. Estimated Blood Loss:     Estimated blood loss: none. Impression:               - One 2 mm polyp in the distal sigmoid colon,                            removed with a cold biopsy forceps. Resected and                            retrieved.                           - The examination was otherwise normal on direct                            and retroflexion views. Recommendation:           - Discharge patient to home.                           - Resume regular diet today.                           - Continue present medications.                            - Await pathology results.                           - Repeat colonoscopy in 5 years for surveillance                            given the history of tubular adenoma. Thornton Park MD, MD 09/23/2018 9:22:33 AM This report has been signed electronically.

## 2018-09-23 NOTE — Progress Notes (Signed)
Pt's states no medical or surgical changes since previsit or office visit. 

## 2018-09-23 NOTE — Progress Notes (Signed)
Called to room to assist during endoscopic procedure.  Patient ID and intended procedure confirmed with present staff. Received instructions for my participation in the procedure from the performing physician.  

## 2018-09-23 NOTE — Progress Notes (Signed)
To PACU, VSS. Report to [email or web address] 

## 2018-09-24 ENCOUNTER — Telehealth: Payer: Self-pay | Admitting: *Deleted

## 2018-09-24 NOTE — Telephone Encounter (Signed)
  Follow up Call-  Call back number 09/23/2018  Post procedure Call Back phone  # [phone]  Permission to leave phone message Yes  Some recent data might be hidden     Patient questions:  Do you have a fever, pain , or abdominal swelling? No. Pain Score  0 *  Have you tolerated food without any problems? Yes.    Have you been able to return to your normal activities? Yes.    Do you have any questions about your discharge instructions: Diet   No. Medications  No. Follow up visit  No.  Do you have questions or concerns about your Care? No.  Actions: * If pain score is 4 or above: No action needed, pain <4.

## 2018-09-28 ENCOUNTER — Encounter: Payer: Self-pay | Admitting: Gastroenterology

## 2018-10-16 ENCOUNTER — Encounter: Payer: Self-pay | Admitting: Nurse Practitioner

## 2018-10-16 ENCOUNTER — Ambulatory Visit: Payer: 59 | Admitting: Nurse Practitioner

## 2018-10-16 VITALS — BP 162/96 | HR 66 | Temp 98.5°F | Ht 66.0 in | Wt 235.8 lb

## 2018-10-16 DIAGNOSIS — M25551 Pain in right hip: Secondary | ICD-10-CM | POA: Diagnosis not present

## 2018-10-16 DIAGNOSIS — G8929 Other chronic pain: Secondary | ICD-10-CM

## 2018-10-16 DIAGNOSIS — J4520 Mild intermittent asthma, uncomplicated: Secondary | ICD-10-CM | POA: Diagnosis not present

## 2018-10-16 DIAGNOSIS — I10 Essential (primary) hypertension: Secondary | ICD-10-CM | POA: Diagnosis not present

## 2018-10-16 MED ORDER — ALBUTEROL SULFATE HFA 108 (90 BASE) MCG/ACT IN AERS: 1.0000 | INHALATION_SPRAY | Freq: Four times a day (QID) | RESPIRATORY_TRACT | 3 refills | Status: DC | PRN

## 2018-10-16 MED ORDER — DICLOFENAC SODIUM 2 % TD SOLN: 1.0000 [in_us] | Freq: Two times a day (BID) | TRANSDERMAL | 1 refills | Status: DC

## 2018-10-16 MED ORDER — LOSARTAN POTASSIUM 25 MG PO TABS: 25.0000 mg | ORAL_TABLET | Freq: Two times a day (BID) | ORAL | 5 refills | Status: DC

## 2018-10-16 NOTE — Progress Notes (Signed)
Subjective:  Patient ID: Rueben Bash, female    DOB: June 25, 1961  Age: 57 y.o. MRN: [id number]  CC: Follow-up (3 mo fu/losartan consult. BP is up and down. inusurance is not going to pay for albuterol. )  HPI  HTN: Elevated BP today, did not have BP meds this morning. Average BP in Am 110/70-120/70 per FitBit wrist band Average BP in PM 130s/70s per FitBit wrist band reports discrepancy in BP between fitbit watch and regular BP monitor. Denies any acute symptoms. BP Readings from Last 3 Encounters:  10/16/18 (!) 162/96  09/23/18 119/83  07/12/18 132/86   Needs refill on pennsaid gel.  Needs ventolin changed to proair due to cost  Reviewed past Medical, Social and Family history today.  Outpatient Medications Prior to Visit  Medication Sig Dispense Refill  . beclomethasone (QVAR REDIHALER) 40 MCG/ACT inhaler Inhale 1 puff into the lungs 2 (two) times daily. Rinse mouth after each use 10.6 g 5  . Cetirizine HCl (ZYRTEC PO) Take 1 tablet by mouth daily.     . Cholecalciferol (VITAMIN D PO) Take 1 tablet by mouth daily.     Marland Kitchen EPINEPHrine 0.3 mg/0.3 mL IJ SOAJ injection use as directed by prescriber  0  . montelukast (SINGULAIR) 10 MG tablet Take 1 tablet (10 mg total) by mouth at bedtime. 90 tablet 1  . Multiple Vitamins-Calcium (ONE-A-DAY WOMENS PO) Take 1 tablet by mouth daily.     Marland Kitchen omeprazole (PRILOSEC) 20 MG capsule TAKE 1 CAPSULE BY MOUTH ONCE DAILY 90 capsule 3  . albuterol (VENTOLIN HFA) 108 (90 Base) MCG/ACT inhaler Inhale 1-2 puffs into the lungs every 6 (six) hours as needed for wheezing or shortness of breath. 18 g 1  . Diclofenac Sodium (PENNSAID) 2 % SOLN Place 1 inch onto the skin 2 (two) times daily. 112 g 1  . losartan (COZAAR) 50 MG tablet Take 1 tablet (50 mg total) by mouth daily. 30 tablet 3   No facility-administered medications prior to visit.     ROS See HPI  Objective:  BP (!) 162/96   Pulse 66   Temp 98.5 F (36.9 C) (Oral)   Ht 5\' 6"  (1.676  m)   Wt 235 lb 12.8 oz (107 kg)   SpO2 98%   BMI 38.06 kg/m   BP Readings from Last 3 Encounters:  10/16/18 (!) 162/96  09/23/18 119/83  07/12/18 132/86    Wt Readings from Last 3 Encounters:  10/16/18 235 lb 12.8 oz (107 kg)  09/23/18 236 lb (107 kg)  09/09/18 236 lb 9.6 oz (107.3 kg)    Physical Exam  Constitutional: No distress.  Cardiovascular: Normal rate.  Pulmonary/Chest: Effort normal.  Musculoskeletal: She exhibits no edema.  Vitals reviewed.   Lab Results  Component Value Date   WBC 4.0 10/31/2017   HGB 14.6 10/31/2017   HCT 44.7 10/31/2017   PLT 230.0 10/31/2017   GLUCOSE 78 07/12/2018   CHOL 229 (H) 06/13/2018   TRIG 72 06/13/2018   HDL 51 06/13/2018   LDLCALC 164 (H) 06/13/2018   ALT 17 01/25/2018   AST 18 01/25/2018   NA 139 07/12/2018   K 3.9 07/12/2018   CL 105 07/12/2018   CREATININE 0.95 07/12/2018   BUN 10 07/12/2018   CO2 28 07/12/2018   TSH 1.05 10/29/2017    Mm 3d Screen Breast Bilateral  Result Date: 08/19/2018 CLINICAL DATA:  Screening. EXAM: DIGITAL SCREENING BILATERAL MAMMOGRAM WITH TOMO AND CAD COMPARISON:  Previous exam(s). ACR  Breast Density Category b: There are scattered areas of fibroglandular density. FINDINGS: There are no findings suspicious for malignancy. Images were processed with CAD. IMPRESSION: No mammographic evidence of malignancy. A result letter of this screening mammogram will be mailed directly to the patient. RECOMMENDATION: Screening mammogram in one year. (Code:SM-B-01Y) BI-RADS CATEGORY  1: Negative. Electronically Signed   By: Ammie Ferrier M.D.   On: 08/19/2018 14:29    Assessment & Plan:   Anamae was seen today for follow-up.  Diagnoses and all orders for this visit:  Essential hypertension -     losartan (COZAAR) 25 MG tablet; Take 1 tablet (25 mg total) by mouth 2 (two) times daily.  Mild intermittent asthma without complication -     albuterol (PROAIR HFA) 108 (90 Base) MCG/ACT inhaler;  Inhale 1-2 puffs into the lungs every 6 (six) hours as needed for wheezing or shortness of breath.  Chronic right hip pain -     Diclofenac Sodium (PENNSAID) 2 % SOLN; Place 1 inch onto the skin 2 (two) times daily.   I have discontinued Ynez R. Beyene's albuterol. I have also changed her losartan. Additionally, I am having her start on albuterol. Lastly, I am having her maintain her Multiple Vitamins-Calcium (ONE-A-DAY WOMENS PO), Cetirizine HCl (ZYRTEC PO), Cholecalciferol (VITAMIN D PO), EPINEPHrine, montelukast, beclomethasone, omeprazole, and Diclofenac Sodium.  Meds ordered this encounter  Medications  . losartan (COZAAR) 25 MG tablet    Sig: Take 1 tablet (25 mg total) by mouth 2 (two) times daily.    Dispense:  60 tablet    Refill:  5    Order Specific Question:   Supervising Provider    Answer:   Lucille Passy [[id number]]  . albuterol (PROAIR HFA) 108 (90 Base) MCG/ACT inhaler    Sig: Inhale 1-2 puffs into the lungs every 6 (six) hours as needed for wheezing or shortness of breath.    Dispense:  1 Inhaler    Refill:  3    Order Specific Question:   Supervising Provider    Answer:   Lucille Passy [[id number]]  . Diclofenac Sodium (PENNSAID) 2 % SOLN    Sig: Place 1 inch onto the skin 2 (two) times daily.    Dispense:  112 g    Refill:  1    Order Specific Question:   Supervising Provider    Answer:   Lucille Passy [[id number]]    Problem List Items Addressed This Visit      Cardiovascular and Mediastinum   Essential hypertension - Primary   Relevant Medications   losartan (COZAAR) 25 MG tablet     Respiratory   Asthma   Relevant Medications   albuterol (PROAIR HFA) 108 (90 Base) MCG/ACT inhaler     Other   Chronic right hip pain   Relevant Medications   Diclofenac Sodium (PENNSAID) 2 % SOLN       Follow-up: Return for maintain uppconing appt 12/18/2017 (CPE-fasting).  Wilfred Lacy, NP

## 2018-10-16 NOTE — Patient Instructions (Addendum)
Maintain current dose of losartan, but now take half tab AM and half tab PM. Need a bigger cuff for regular BP machine.  I switched ventolin to Proair for cost reasons.  Pennsaid foam sent to [facility].

## 2018-12-18 ENCOUNTER — Ambulatory Visit (INDEPENDENT_AMBULATORY_CARE_PROVIDER_SITE_OTHER): Payer: 59 | Admitting: Nurse Practitioner

## 2018-12-18 ENCOUNTER — Encounter: Payer: Self-pay | Admitting: Nurse Practitioner

## 2018-12-18 VITALS — BP 160/96 | HR 66 | Temp 98.0°F | Ht 66.0 in | Wt 230.0 lb

## 2018-12-18 DIAGNOSIS — Z0001 Encounter for general adult medical examination with abnormal findings: Secondary | ICD-10-CM | POA: Diagnosis not present

## 2018-12-18 DIAGNOSIS — Z13220 Encounter for screening for lipoid disorders: Secondary | ICD-10-CM

## 2018-12-18 DIAGNOSIS — Z114 Encounter for screening for human immunodeficiency virus [HIV]: Secondary | ICD-10-CM

## 2018-12-18 DIAGNOSIS — I10 Essential (primary) hypertension: Secondary | ICD-10-CM | POA: Diagnosis not present

## 2018-12-18 DIAGNOSIS — Z136 Encounter for screening for cardiovascular disorders: Secondary | ICD-10-CM

## 2018-12-18 DIAGNOSIS — E785 Hyperlipidemia, unspecified: Secondary | ICD-10-CM | POA: Diagnosis not present

## 2018-12-18 LAB — COMPREHENSIVE METABOLIC PANEL
ALT: 70 U/L — AB (ref 0–35)
AST: 42 U/L — ABNORMAL HIGH (ref 0–37)
Albumin: 3.8 g/dL (ref 3.5–5.2)
Alkaline Phosphatase: 95 U/L (ref 39–117)
BUN: 13 mg/dL (ref 6–23)
CO2: 23 meq/L (ref 19–32)
Calcium: 9.1 mg/dL (ref 8.4–10.5)
Chloride: 108 mEq/L (ref 96–112)
Creatinine, Ser: 0.88 mg/dL (ref 0.40–1.20)
GFR: 79.61 mL/min (ref >60.00)
Glucose, Bld: 97 mg/dL (ref 70–99)
Potassium: 3.6 mEq/L (ref 3.5–5.1)
SODIUM: 141 meq/L (ref 135–145)
Total Bilirubin: 0.6 mg/dL (ref 0.2–1.2)
Total Protein: 7.1 g/dL (ref 6.0–8.3)

## 2018-12-18 LAB — LIPID PANEL
CHOLESTEROL: 182 mg/dL (ref 0–200)
HDL: 43 mg/dL (ref >39.00)
LDL Cholesterol: 123 mg/dL — ABNORMAL HIGH (ref 0–99)
NonHDL: 138.79
TRIGLYCERIDES: 78 mg/dL (ref 0.0–149.0)
Total CHOL/HDL Ratio: 4
VLDL: 15.6 mg/dL (ref 0.0–40.0)

## 2018-12-18 LAB — CBC
HEMATOCRIT: 41.7 % (ref 36.0–46.0)
HEMOGLOBIN: 13.8 g/dL (ref 12.0–15.0)
MCHC: 33 g/dL (ref 30.0–36.0)
MCV: 85.2 fl (ref 78.0–100.0)
Platelets: 231 10*3/uL (ref 150.0–400.0)
RBC: 4.89 Mil/uL (ref 3.87–5.11)
RDW: 14.5 % (ref 11.5–15.5)
WBC: 3.8 10*3/uL — AB (ref 4.0–10.5)

## 2018-12-18 LAB — TSH: TSH: 1.7 u[IU]/mL (ref 0.35–4.50)

## 2018-12-18 NOTE — Progress Notes (Signed)
Subjective:    Patient ID: Claire Sutton, female    DOB: 17-Apr-1961, 58 y.o.   MRN: [id number]  Patient presents today for complete physical and BP re eval. HPI   HTN: Persistent elevation in office. Current use of losartan. Home BP readings: 110s/70s-120s/80s. With wrist band. BP Readings from Last 3 Encounters:  12/18/18 (!) 160/96  10/16/18 (!) 162/96  09/23/18 119/83   Sexual History (orientation,birth control, marital status, STD):last pap 41yrs ago, normal per patient, done by Dr. Phineas Real. ([place])  Depression/Suicide:reports increased anxiety with recent death of father and ex-husband. Also has grandchild in hospital (born at 24weeks).she thinks this might be contributing to elevating BP, but is confused why wrist monitor indicates normal BP. Depression screen [facility] 2/9 12/18/2018 06/13/2018 08/15/2017 09/07/2016  Decreased Interest 0 0 0 0  Down, Depressed, Hopeless 0 0 0 0  PHQ - 2 Score 0 0 0 0   Vision: will schedule  Dental:will schedule  Immunizations: (TDAP, Hep C screen, Pneumovax, Influenza, zoster)  Health Maintenance  Topic Date Due  . Pap Smear  01/29/2019*  . Flu Shot  03/01/2019*  . Mammogram  08/17/2020  . Colon Cancer Screening  09/24/2023  . Tetanus Vaccine  04/19/2027  .  Hepatitis C: One time screening is recommended by Center for Disease Control  (CDC) for  adults born from [birth year] through 1966.   Completed  . HIV Screening  Completed  *Topic was postponed. The date shown is not the original due date.   Diet:low fat and low sodium.  Weight:  Wt Readings from Last 3 Encounters:  12/18/18 230 lb (104.3 kg)  10/16/18 235 lb 12.8 oz (107 kg)  09/23/18 236 lb (107 kg)   Exercise:walking and zumba  Fall Risk: Fall Risk  12/18/2018 06/13/2018 08/15/2017 09/07/2016  Falls in the past year? 0 No Yes No  Number falls in past yr: - - 1 -  Injury with Fall? - - No -   Home Safety:home alone  Advanced Directive: Advanced Directives 12/18/2018   Does Patient Have a Medical Advance Directive? No  Would patient like information on creating a medical advance directive? Yes (MAU/Ambulatory/Procedural Areas - Information given)     Medications and allergies reviewed with patient and updated if appropriate.  Patient Active Problem List   Diagnosis Date Noted  . History of colon polyps 06/13/2018  . Chronic right hip pain 03/07/2018  . Asthma 03/14/2017  . Shortness of breath on exertion 10/18/2016  . Abnormal EKG 10/18/2016  . Dyslipidemia 10/18/2016  . Essential hypertension 09/15/2016  . Obesity   . Gastroesophageal reflux disease 09/07/2016  . Acute bronchitis 09/07/2016  . Intermediate uveitis of left eye 06/16/2014  . Scleritis and episcleritis of left eye 06/16/2014    Current Outpatient Medications on File Prior to Visit  Medication Sig Dispense Refill  . albuterol (PROAIR HFA) 108 (90 Base) MCG/ACT inhaler Inhale 1-2 puffs into the lungs every 6 (six) hours as needed for wheezing or shortness of breath. 1 Inhaler 3  . beclomethasone (QVAR REDIHALER) 40 MCG/ACT inhaler Inhale 1 puff into the lungs 2 (two) times daily. Rinse mouth after each use 10.6 g 5  . Cetirizine HCl (ZYRTEC PO) Take 1 tablet by mouth daily.     . Cholecalciferol (VITAMIN D PO) Take 1 tablet by mouth daily.     . Diclofenac Sodium (PENNSAID) 2 % SOLN Place 1 inch onto the skin 2 (two) times daily. 112 g 1  . EPINEPHrine  0.3 mg/0.3 mL IJ SOAJ injection use as directed by prescriber  0  . losartan (COZAAR) 25 MG tablet Take 1 tablet (25 mg total) by mouth 2 (two) times daily. 60 tablet 5  . montelukast (SINGULAIR) 10 MG tablet Take 1 tablet (10 mg total) by mouth at bedtime. 90 tablet 1  . Multiple Vitamins-Calcium (ONE-A-DAY WOMENS PO) Take 1 tablet by mouth daily.     Marland Kitchen omeprazole (PRILOSEC) 20 MG capsule TAKE 1 CAPSULE BY MOUTH ONCE DAILY 90 capsule 3   No current facility-administered medications on file prior to visit.     Past Medical  History:  Diagnosis Date  . Allergy   . ASCUS (atypical squamous cells of undetermined significance) on Pap smear 07/2006   NEG HIGH RISK HPV-- THEN IN 10/2006 AND 07/2007 PAPS WITH BENIGN REACTIVE CHANGES  . Asthma   . GERD (gastroesophageal reflux disease)   . Headache   . History of blood clot in brain    a. Fall age 50 - was told she had a blood clot in the brain from this.  . Hyperlipidemia   . Hypertension   . LGSIL (low grade squamous intraepithelial dysplasia) 10/05   PAP ON 02/2005, 7,8,9,10,11,12WNL  . Obesity   . Seizures ([place])    a. began after she had a fall at age 28, last seizure age 77.    Past Surgical History:  Procedure Laterality Date  . COLPOSCOPY    . ENDOMETRIAL ABLATION  02/2007   NOVASURE/MARSUPILIZATION OF BARTHOLIN  . MARSUPILIZATION OF BARTHOLIN  02/2007   AT TIME OF NOVASURE ABLATION SURGERY  . TUBAL LIGATION      Social History   Socioeconomic History  . Marital status: Single    Spouse name: Not on file  . Number of children: Not on file  . Years of education: Not on file  . Highest education level: Not on file  Occupational History  . Not on file  Social Needs  . Financial resource strain: Not on file  . Food insecurity:    Worry: Not on file    Inability: Not on file  . Transportation needs:    Medical: Not on file    Non-medical: Not on file  Tobacco Use  . Smoking status: Never Smoker  . Smokeless tobacco: Never Used  Substance and Sexual Activity  . Alcohol use: No    Alcohol/week: 0.0 standard drinks  . Drug use: No  . Sexual activity: Not Currently    Birth control/protection: None    Comment: 1st intercourse 58 yo-More than 5 partners  Lifestyle  . Physical activity:    Days per week: Not on file    Minutes per session: Not on file  . Stress: Not on file  Relationships  . Social connections:    Talks on phone: Not on file    Gets together: Not on file    Attends religious service: Not on file    Active member of  club or organization: Not on file    Attends meetings of clubs or organizations: Not on file    Relationship status: Not on file  Other Topics Concern  . Not on file  Social History Narrative  . Not on file    Family History  Problem Relation Age of Onset  . Hypertension Mother   . Hyperlipidemia Mother   . Heart disease Mother        was told she may have had a few MIs that she was  unaware of  . Diabetes Father   . Hypertension Sister   . Diabetes Paternal Grandfather   . Colon cancer Neg Hx   . Esophageal cancer Neg Hx   . Rectal cancer Neg Hx   . Stomach cancer Neg Hx         Review of Systems  Constitutional: Negative for fever, malaise/fatigue and weight loss.  HENT: Negative for congestion and sore throat.   Eyes:       Negative for visual changes  Respiratory: Negative for cough and shortness of breath.   Cardiovascular: Negative for chest pain, palpitations and leg swelling.  Gastrointestinal: Negative for blood in stool, constipation, diarrhea and heartburn.  Genitourinary: Negative for dysuria, frequency and urgency.  Musculoskeletal: Negative for falls, joint pain and myalgias.  Skin: Negative for rash.  Neurological: Negative for dizziness, sensory change and headaches.  Endo/Heme/Allergies: Does not bruise/bleed easily.  Psychiatric/Behavioral: Negative for depression, substance abuse and suicidal ideas. The patient is nervous/anxious. The patient does not have insomnia.     Objective:   Vitals:   12/18/18 0811  BP: (!) 160/96  Pulse: 66  Temp: 98 F (36.7 C)  SpO2: 98%    Body mass index is 37.12 kg/m.   Physical Examination:  Physical Exam Vitals signs reviewed. Exam conducted with a chaperone present.  Constitutional:      General: She is not in acute distress.    Appearance: She is well-developed.  HENT:     Right Ear: Tympanic membrane, ear canal and external ear normal.     Left Ear: Tympanic membrane, ear canal and external ear  normal.     Nose: Nose normal.     Mouth/Throat:     Mouth: Mucous membranes are moist.     Pharynx: No oropharyngeal exudate.  Eyes:     Extraocular Movements: Extraocular movements intact.     Conjunctiva/sclera: Conjunctivae normal.     Pupils: Pupils are equal, round, and reactive to light.  Neck:     Musculoskeletal: Normal range of motion and neck supple.  Cardiovascular:     Rate and Rhythm: Normal rate and regular rhythm.     Pulses: Normal pulses.     Heart sounds: Normal heart sounds.  Pulmonary:     Effort: Pulmonary effort is normal. No respiratory distress.     Breath sounds: Normal breath sounds.  Chest:     Chest wall: No tenderness.     Breasts:        Right: Normal.        Left: Normal.  Abdominal:     General: Bowel sounds are normal.     Palpations: Abdomen is soft.  Genitourinary:    Comments: Pelvic exam deferred to GYN Musculoskeletal: Normal range of motion.     Right lower leg: No edema.     Left lower leg: No edema.  Lymphadenopathy:     Cervical: No cervical adenopathy.     Upper Body:     Right upper body: No supraclavicular, axillary or pectoral adenopathy.     Left upper body: No supraclavicular, axillary or pectoral adenopathy.  Skin:    Findings: No erythema or rash.  Neurological:     Mental Status: She is alert and oriented to person, place, and time.     Deep Tendon Reflexes: Reflexes are normal and symmetric.  Psychiatric:        Mood and Affect: Mood normal.        Behavior: Behavior normal.  Thought Content: Thought content normal.     ASSESSMENT and PLAN:  Claire Sutton was seen today for annual exam.  Diagnoses and all orders for this visit:  Encounter for preventative adult health care exam with abnormal findings -     CBC -     TSH -     Comprehensive metabolic panel  Essential hypertension  Encounter for screening for human immunodeficiency virus (HIV) -     HIV Antibody (routine testing w rflx)  Encounter for  lipid screening for cardiovascular disease  Dyslipidemia -     Lipid panel   Essential hypertension Uncontrolled with losartan 50mg  Added amlodipine 5mg . F/up in 35month     Problem List Items Addressed This Visit      Cardiovascular and Mediastinum   Essential hypertension    Uncontrolled with losartan 50mg  Added amlodipine 5mg . F/up in 58month        Other   Dyslipidemia   Relevant Orders   Lipid panel (Completed)    Other Visit Diagnoses    Encounter for preventative adult health care exam with abnormal findings    -  Primary   Relevant Orders   CBC (Completed)   TSH (Completed)   Comprehensive metabolic panel (Completed)   Encounter for screening for human immunodeficiency virus (HIV)       Relevant Orders   HIV Antibody (routine testing w rflx) (Completed)   Encounter for lipid screening for cardiovascular disease           Follow up: Return in about 4 weeks (around 01/15/2019) for HTN.  Wilfred Lacy, NP

## 2018-12-18 NOTE — Patient Instructions (Addendum)
Negative HIV. Improved lipid panel. Continue healthy diet and exercise. Stable cbc, cmp and tsh. Start amlodipine at HS for HTN. F/up in 71monthinstead of 66month Check BP at home (upper arm cuff) and send reading via mychart. Will need to adjust BP medication if reading is elevated.  Continue good job with diet and regular exercise.  Schedule dental cleaning and eye exam annually.   Health Maintenance, Female Adopting a healthy lifestyle and getting preventive care can go a long way to promote health and wellness. Talk with your health care provider about what schedule of regular examinations is right for you. This is a good chance for you to check in with your provider about disease prevention and staying healthy. In between checkups, there are plenty of things you can do on your own. Experts have done a lot of research about which lifestyle changes and preventive measures are most likely to keep you healthy. Ask your health care provider for more information. Weight and diet Eat a healthy diet  Be sure to include plenty of vegetables, fruits, low-fat dairy products, and lean protein.  Do not eat a lot of foods high in solid fats, added sugars, or salt.  Get regular exercise. This is one of the most important things you can do for your health. ? Most adults should exercise for at least 150 minutes each week. The exercise should increase your heart rate and make you sweat (moderate-intensity exercise). ? Most adults should also do strengthening exercises at least twice a week. This is in addition to the moderate-intensity exercise. Maintain a healthy weight  Body mass index (BMI) is a measurement that can be used to identify possible weight problems. It estimates body fat based on height and weight. Your health care provider can help determine your BMI and help you achieve or maintain a healthy weight.  For females 2075ears of age and older: ? A BMI below 18.5 is considered  underweight. ? A BMI of 18.5 to 24.9 is normal. ? A BMI of 25 to 29.9 is considered overweight. ? A BMI of 30 and above is considered obese. Watch levels of cholesterol and blood lipids  You should start having your blood tested for lipids and cholesterol at 2022ears of age, then have this test every 5 years.  You may need to have your cholesterol levels checked more often if: ? Your lipid or cholesterol levels are high. ? You are older than 5027ears of age. ? You are at high risk for heart disease. Cancer screening Lung Cancer  Lung cancer screening is recommended for adults [id number] old who are at high risk for lung cancer because of a history of smoking.  A yearly low-dose CT scan of the lungs is recommended for people who: ? Currently smoke. ? Have quit within the past 15 years. ? Have at least a 30-pack-year history of smoking. A pack year is smoking an average of one pack of cigarettes a day for 1 year.  Yearly screening should continue until it has been 15 years since you quit.  Yearly screening should stop if you develop a health problem that would prevent you from having lung cancer treatment. Breast Cancer  Practice breast self-awareness. This means understanding how your breasts normally appear and feel.  It also means doing regular breast self-exams. Let your health care provider know about any changes, no matter how small.  If you are in your 20s or 30s, you should have a clinical  breast exam (CBE) by a health care provider every 1-3 years as part of a regular health exam.  If you are 44 or older, have a CBE every year. Also consider having a breast X-ray (mammogram) every year.  If you have a family history of breast cancer, talk to your health care provider about genetic screening.  If you are at high risk for breast cancer, talk to your health care provider about having an MRI and a mammogram every year.  Breast cancer gene (BRCA) assessment is recommended  for women who have family members with BRCA-related cancers. BRCA-related cancers include: ? Breast. ? Ovarian. ? Tubal. ? Peritoneal cancers.  Results of the assessment will determine the need for genetic counseling and BRCA1 and BRCA2 testing. Cervical Cancer Your health care provider may recommend that you be screened regularly for cancer of the pelvic organs (ovaries, uterus, and vagina). This screening involves a pelvic examination, including checking for microscopic changes to the surface of your cervix (Pap test). You may be encouraged to have this screening done every 3 years, beginning at age 52.  For women ages 75-65, health care providers may recommend pelvic exams and Pap testing every 3 years, or they may recommend the Pap and pelvic exam, combined with testing for human papilloma virus (HPV), every 5 years. Some types of HPV increase your risk of cervical cancer. Testing for HPV may also be done on women of any age with unclear Pap test results.  Other health care providers may not recommend any screening for nonpregnant women who are considered low risk for pelvic cancer and who do not have symptoms. Ask your health care provider if a screening pelvic exam is right for you.  If you have had past treatment for cervical cancer or a condition that could lead to cancer, you need Pap tests and screening for cancer for at least 20 years after your treatment. If Pap tests have been discontinued, your risk factors (such as having a new sexual partner) need to be reassessed to determine if screening should resume. Some women have medical problems that increase the chance of getting cervical cancer. In these cases, your health care provider may recommend more frequent screening and Pap tests. Colorectal Cancer  This type of cancer can be detected and often prevented.  Routine colorectal cancer screening usually begins at 59 years of age and continues through 59 years of age.  Your health  care provider may recommend screening at an earlier age if you have risk factors for colon cancer.  Your health care provider may also recommend using home test kits to check for hidden blood in the stool.  A small camera at the end of a tube can be used to examine your colon directly (sigmoidoscopy or colonoscopy). This is done to check for the earliest forms of colorectal cancer.  Routine screening usually begins at age 61.  Direct examination of the colon should be repeated every 5-10 years through 59 years of age. However, you may need to be screened more often if early forms of precancerous polyps or small growths are found. Skin Cancer  Check your skin from head to toe regularly.  Tell your health care provider about any new moles or changes in moles, especially if there is a change in a mole's shape or color.  Also tell your health care provider if you have a mole that is larger than the size of a pencil eraser.  Always use sunscreen. Apply sunscreen liberally and  repeatedly throughout the day.  Protect yourself by wearing long sleeves, pants, a wide-brimmed hat, and sunglasses whenever you are outside. Heart disease, diabetes, and high blood pressure  High blood pressure causes heart disease and increases the risk of stroke. High blood pressure is more likely to develop in: ? People who have blood pressure in the high end of the normal range (130-139/85-89 mm Hg). ? People who are overweight or obese. ? People who are African American.  If you are 6-42 years of age, have your blood pressure checked every 3-5 years. If you are 43 years of age or older, have your blood pressure checked every year. You should have your blood pressure measured twice-once when you are at a hospital or clinic, and once when you are not at a hospital or clinic. Record the average of the two measurements. To check your blood pressure when you are not at a hospital or clinic, you can use: ? An automated  blood pressure machine at a pharmacy. ? A home blood pressure monitor.  If you are between 89 years and 81 years old, ask your health care provider if you should take aspirin to prevent strokes.  Have regular diabetes screenings. This involves taking a blood sample to check your fasting blood sugar level. ? If you are at a normal weight and have a low risk for diabetes, have this test once every three years after 59 years of age. ? If you are overweight and have a high risk for diabetes, consider being tested at a younger age or more often. Preventing infection Hepatitis B  If you have a higher risk for hepatitis B, you should be screened for this virus. You are considered at high risk for hepatitis B if: ? You were born in a country where hepatitis B is common. Ask your health care provider which countries are considered high risk. ? Your parents were born in a high-risk country, and you have not been immunized against hepatitis B (hepatitis B vaccine). ? You have HIV or AIDS. ? You use needles to inject street drugs. ? You live with someone who has hepatitis B. ? You have had sex with someone who has hepatitis B. ? You get hemodialysis treatment. ? You take certain medicines for conditions, including cancer, organ transplantation, and autoimmune conditions. Hepatitis C  Blood testing is recommended for: ? Everyone born from [birth year] through 1966. ? Anyone with known risk factors for hepatitis C. Sexually transmitted infections (STIs)  You should be screened for sexually transmitted infections (STIs) including gonorrhea and chlamydia if: ? You are sexually active and are younger than 59 years of age. ? You are older than 59 years of age and your health care provider tells you that you are at risk for this type of infection. ? Your sexual activity has changed since you were last screened and you are at an increased risk for chlamydia or gonorrhea. Ask your health care provider if you are at  risk.  If you do not have HIV, but are at risk, it may be recommended that you take a prescription medicine daily to prevent HIV infection. This is called pre-exposure prophylaxis (PrEP). You are considered at risk if: ? You are sexually active and do not regularly use condoms or know the HIV status of your partner(s). ? You take drugs by injection. ? You are sexually active with a partner who has HIV. Talk with your health care provider about whether you are at high risk  of being infected with HIV. If you choose to begin PrEP, you should first be tested for HIV. You should then be tested every 3 months for as long as you are taking PrEP. Pregnancy  If you are premenopausal and you may become pregnant, ask your health care provider about preconception counseling.  If you may become pregnant, take 400 to 800 micrograms (mcg) of folic acid every day.  If you want to prevent pregnancy, talk to your health care provider about birth control (contraception). Osteoporosis and menopause  Osteoporosis is a disease in which the bones lose minerals and strength with aging. This can result in serious bone fractures. Your risk for osteoporosis can be identified using a bone density scan.  If you are 19 years of age or older, or if you are at risk for osteoporosis and fractures, ask your health care provider if you should be screened.  Ask your health care provider whether you should take a calcium or vitamin D supplement to lower your risk for osteoporosis.  Menopause may have certain physical symptoms and risks.  Hormone replacement therapy may reduce some of these symptoms and risks. Talk to your health care provider about whether hormone replacement therapy is right for you. Follow these instructions at home:  Schedule regular health, dental, and eye exams.  Stay current with your immunizations.  Do not use any tobacco products including cigarettes, chewing tobacco, or electronic  cigarettes.  If you are pregnant, do not drink alcohol.  If you are breastfeeding, limit how much and how often you drink alcohol.  Limit alcohol intake to no more than 1 drink per day for nonpregnant women. One drink equals 12 ounces of beer, 5 ounces of wine, or 1 ounces of hard liquor.  Do not use street drugs.  Do not share needles.  Ask your health care provider for help if you need support or information about quitting drugs.  Tell your health care provider if you often feel depressed.  Tell your health care provider if you have ever been abused or do not feel safe at home. This information is not intended to replace advice given to you by your health care provider. Make sure you discuss any questions you have with your health care provider. Document Released: 05/08/2011 Document Revised: 03/30/2016 Document Reviewed: 07/27/2015 Elsevier Interactive Patient Education  2019 [employer].

## 2018-12-19 LAB — HIV ANTIBODY (ROUTINE TESTING W REFLEX): HIV 1&2 Ab, 4th Generation: NONREACTIVE

## 2018-12-20 ENCOUNTER — Encounter: Payer: Self-pay | Admitting: Nurse Practitioner

## 2018-12-20 DIAGNOSIS — I10 Essential (primary) hypertension: Secondary | ICD-10-CM

## 2018-12-20 MED ORDER — AMLODIPINE BESYLATE 5 MG PO TABS: 5.0000 mg | ORAL_TABLET | Freq: Every day | ORAL | 3 refills | Status: DC

## 2018-12-20 NOTE — Assessment & Plan Note (Signed)
Uncontrolled with losartan 50mg  Added amlodipine 5mg . F/up in 81month

## 2019-01-22 ENCOUNTER — Encounter: Payer: Self-pay | Admitting: Nurse Practitioner

## 2019-01-22 ENCOUNTER — Other Ambulatory Visit: Payer: Self-pay

## 2019-01-22 ENCOUNTER — Ambulatory Visit: Payer: 59 | Admitting: Nurse Practitioner

## 2019-01-22 VITALS — BP 134/86 | HR 69 | Temp 97.9°F | Ht 66.0 in | Wt 233.6 lb

## 2019-01-22 DIAGNOSIS — Z780 Asymptomatic menopausal state: Secondary | ICD-10-CM | POA: Diagnosis not present

## 2019-01-22 DIAGNOSIS — I10 Essential (primary) hypertension: Secondary | ICD-10-CM

## 2019-01-22 DIAGNOSIS — R748 Abnormal levels of other serum enzymes: Secondary | ICD-10-CM | POA: Diagnosis not present

## 2019-01-22 DIAGNOSIS — K219 Gastro-esophageal reflux disease without esophagitis: Secondary | ICD-10-CM

## 2019-01-22 DIAGNOSIS — J4520 Mild intermittent asthma, uncomplicated: Secondary | ICD-10-CM

## 2019-01-22 LAB — HEPATIC FUNCTION PANEL
ALK PHOS: 100 U/L (ref 39–117)
ALT: 28 U/L (ref 0–35)
AST: 20 U/L (ref 0–37)
Albumin: 4 g/dL (ref 3.5–5.2)
Bilirubin, Direct: 0.2 mg/dL (ref 0.0–0.3)
Total Bilirubin: 0.8 mg/dL (ref 0.2–1.2)
Total Protein: 7.4 g/dL (ref 6.0–8.3)

## 2019-01-22 LAB — FOLLICLE STIMULATING HORMONE: FSH: 64.3 m[IU]/mL

## 2019-01-22 LAB — LUTEINIZING HORMONE: LH: 50.31 m[IU]/mL

## 2019-01-22 MED ORDER — OMEPRAZOLE 20 MG PO CPDR: 20.0000 mg | DELAYED_RELEASE_CAPSULE | Freq: Every day | ORAL | 3 refills | Status: DC

## 2019-01-22 MED ORDER — MONTELUKAST SODIUM 10 MG PO TABS: 10.0000 mg | ORAL_TABLET | Freq: Every day | ORAL | 3 refills | Status: DC

## 2019-01-22 MED ORDER — LOSARTAN POTASSIUM 25 MG PO TABS: 25.0000 mg | ORAL_TABLET | Freq: Two times a day (BID) | ORAL | 3 refills | Status: DC

## 2019-01-22 MED ORDER — AMLODIPINE BESYLATE 5 MG PO TABS: 5.0000 mg | ORAL_TABLET | Freq: Every day | ORAL | 3 refills | Status: DC

## 2019-01-22 NOTE — Progress Notes (Signed)
Subjective:  Patient ID: Claire Sutton, female    DOB: 1961/10/25  Age: 58 y.o. MRN: [id number]  CC: Follow-up (1 mo fu/HTN and repeat hepatic panel)  HPI HTN: Improved with losartan 50mg  and amlodipine 5mg  Home BP reading: 130s/80s No edema, no headache, no chest pain. BP Readings from Last 3 Encounters:  01/22/19 134/86  12/18/18 (!) 160/96  10/16/18 (!) 162/96   Reports intermittent hotflashes, no cough, no weight loss, no anorexia, no mood swings. S/p endometrial ablation, co no vaginal bleed for over 84yrs  Reviewed past Medical, Social and Family history today.  Outpatient Medications Prior to Visit  Medication Sig Dispense Refill  . albuterol (PROAIR HFA) 108 (90 Base) MCG/ACT inhaler Inhale 1-2 puffs into the lungs every 6 (six) hours as needed for wheezing or shortness of breath. 1 Inhaler 3  . beclomethasone (QVAR REDIHALER) 40 MCG/ACT inhaler Inhale 1 puff into the lungs 2 (two) times daily. Rinse mouth after each use 10.6 g 5  . Cetirizine HCl (ZYRTEC PO) Take 1 tablet by mouth daily.     . Cholecalciferol (VITAMIN D PO) Take 1 tablet by mouth daily.     . Diclofenac Sodium (PENNSAID) 2 % SOLN Place 1 inch onto the skin 2 (two) times daily. 112 g 1  . EPINEPHrine 0.3 mg/0.3 mL IJ SOAJ injection use as directed by prescriber  0  . Multiple Vitamins-Calcium (ONE-A-DAY WOMENS PO) Take 1 tablet by mouth daily.     Marland Kitchen amLODipine (NORVASC) 5 MG tablet Take 1 tablet (5 mg total) by mouth at bedtime. 30 tablet 3  . losartan (COZAAR) 25 MG tablet Take 1 tablet (25 mg total) by mouth 2 (two) times daily. 60 tablet 5  . montelukast (SINGULAIR) 10 MG tablet Take 1 tablet (10 mg total) by mouth at bedtime. 90 tablet 1  . omeprazole (PRILOSEC) 20 MG capsule TAKE 1 CAPSULE BY MOUTH ONCE DAILY 90 capsule 3   No facility-administered medications prior to visit.     ROS See HPI  Objective:  BP 134/86   Pulse 69   Temp 97.9 F (36.6 C) (Oral)   Ht 5\' 6"  (1.676 m)   Wt 233  lb 9.6 oz (106 kg)   SpO2 97%   BMI 37.70 kg/m   BP Readings from Last 3 Encounters:  01/22/19 134/86  12/18/18 (!) 160/96  10/16/18 (!) 162/96    Wt Readings from Last 3 Encounters:  01/22/19 233 lb 9.6 oz (106 kg)  12/18/18 230 lb (104.3 kg)  10/16/18 235 lb 12.8 oz (107 kg)    Physical Exam Vitals signs reviewed.  Cardiovascular:     Rate and Rhythm: Normal rate and regular rhythm.     Pulses: Normal pulses.     Heart sounds: Normal heart sounds.  Pulmonary:     Effort: Pulmonary effort is normal.     Breath sounds: Normal breath sounds.  Neurological:     Mental Status: She is alert and oriented to person, place, and time.  Psychiatric:        Mood and Affect: Mood normal.        Behavior: Behavior normal.        Thought Content: Thought content normal.     Lab Results  Component Value Date   WBC 3.8 (L) 12/18/2018   HGB 13.8 12/18/2018   HCT 41.7 12/18/2018   PLT 231.0 12/18/2018   GLUCOSE 97 12/18/2018   CHOL 182 12/18/2018   TRIG 78.0 12/18/2018  HDL 43.00 12/18/2018   LDLCALC 123 (H) 12/18/2018   ALT 28 01/22/2019   AST 20 01/22/2019   NA 141 12/18/2018   K 3.6 12/18/2018   CL 108 12/18/2018   CREATININE 0.88 12/18/2018   BUN 13 12/18/2018   CO2 23 12/18/2018   TSH 1.70 12/18/2018    Mm 3d Screen Breast Bilateral  Result Date: 08/19/2018 CLINICAL DATA:  Screening. EXAM: DIGITAL SCREENING BILATERAL MAMMOGRAM WITH TOMO AND CAD COMPARISON:  Previous exam(s). ACR Breast Density Category b: There are scattered areas of fibroglandular density. FINDINGS: There are no findings suspicious for malignancy. Images were processed with CAD. IMPRESSION: No mammographic evidence of malignancy. A result letter of this screening mammogram will be mailed directly to the patient. RECOMMENDATION: Screening mammogram in one year. (Code:SM-B-01Y) BI-RADS CATEGORY  1: Negative. Electronically Signed   By: Ammie Ferrier M.D.   On: 08/19/2018 14:29    Assessment &  Plan:   Amanie was seen today for follow-up.  Diagnoses and all orders for this visit:  Essential hypertension -     amLODipine (NORVASC) 5 MG tablet; Take 1 tablet (5 mg total) by mouth at bedtime. -     losartan (COZAAR) 25 MG tablet; Take 1 tablet (25 mg total) by mouth 2 (two) times daily.  Elevated liver enzymes -     Hepatic function panel  Postmenopausal estrogen deficiency -     FSH -     Estrogens, Total -     LH  Mild intermittent asthma without complication -     montelukast (SINGULAIR) 10 MG tablet; Take 1 tablet (10 mg total) by mouth at bedtime.  Gastroesophageal reflux disease without esophagitis -     omeprazole (PRILOSEC) 20 MG capsule; Take 1 capsule (20 mg total) by mouth daily.   I have changed Makayli R. Zahniser's omeprazole. I am also having her maintain her Multiple Vitamins-Calcium (ONE-A-DAY WOMENS PO), Cetirizine HCl (ZYRTEC PO), Cholecalciferol (VITAMIN D PO), EPINEPHrine, beclomethasone, albuterol, Diclofenac Sodium, amLODipine, losartan, and montelukast.  Meds ordered this encounter  Medications  . amLODipine (NORVASC) 5 MG tablet    Sig: Take 1 tablet (5 mg total) by mouth at bedtime.    Dispense:  90 tablet    Refill:  3    Order Specific Question:   Supervising Provider    Answer:   MATTHEWS, CODY [[id number]]  . losartan (COZAAR) 25 MG tablet    Sig: Take 1 tablet (25 mg total) by mouth 2 (two) times daily.    Dispense:  180 tablet    Refill:  3    Order Specific Question:   Supervising Provider    Answer:   MATTHEWS, CODY [[id number]]  . montelukast (SINGULAIR) 10 MG tablet    Sig: Take 1 tablet (10 mg total) by mouth at bedtime.    Dispense:  90 tablet    Refill:  3    Order Specific Question:   Supervising Provider    Answer:   MATTHEWS, CODY [[id number]]  . omeprazole (PRILOSEC) 20 MG capsule    Sig: Take 1 capsule (20 mg total) by mouth daily.    Dispense:  90 capsule    Refill:  3    **Patient requests 90 days supply**    Order Specific  Question:   Supervising Provider    Answer:   Zigmund Daniel, CODY [[id number]]    Problem List Items Addressed This Visit      Cardiovascular and Mediastinum   Essential hypertension - Primary  Relevant Medications   amLODipine (NORVASC) 5 MG tablet   losartan (COZAAR) 25 MG tablet     Respiratory   Asthma   Relevant Medications   montelukast (SINGULAIR) 10 MG tablet     Digestive   Gastroesophageal reflux disease without esophagitis   Relevant Medications   omeprazole (PRILOSEC) 20 MG capsule     Other   Postmenopausal estrogen deficiency   Relevant Orders   FSH (Completed)   Estrogens, Total   LH (Completed)    Other Visit Diagnoses    Elevated liver enzymes       Relevant Orders   Hepatic function panel (Completed)       Follow-up: Return in about 6 months (around 07/25/2019) for HTN.  Wilfred Lacy, NP

## 2019-01-22 NOTE — Patient Instructions (Addendum)
Continue current medications  Normal hepatic panel. FSH and LH indicates postmenopausal state.

## 2019-01-23 ENCOUNTER — Other Ambulatory Visit: Payer: Self-pay

## 2019-01-25 LAB — ESTROGENS, TOTAL: Estrogen: 226.2 pg/mL

## 2019-01-27 ENCOUNTER — Ambulatory Visit (INDEPENDENT_AMBULATORY_CARE_PROVIDER_SITE_OTHER): Payer: 59 | Admitting: Gynecology

## 2019-01-27 ENCOUNTER — Encounter: Payer: Self-pay | Admitting: Gynecology

## 2019-01-27 ENCOUNTER — Other Ambulatory Visit: Payer: Self-pay

## 2019-01-27 VITALS — BP 124/80 | Ht 66.0 in | Wt 233.0 lb

## 2019-01-27 DIAGNOSIS — Z01419 Encounter for gynecological examination (general) (routine) without abnormal findings: Secondary | ICD-10-CM | POA: Diagnosis not present

## 2019-01-27 DIAGNOSIS — N952 Postmenopausal atrophic vaginitis: Secondary | ICD-10-CM

## 2019-01-27 DIAGNOSIS — Z1151 Encounter for screening for human papillomavirus (HPV): Secondary | ICD-10-CM | POA: Diagnosis not present

## 2019-01-27 NOTE — Progress Notes (Signed)
    Claire Sutton January 20, 1961 [id number]        58 y.o.  [id number] for annual gynecologic exam.  Doing well without gynecologic complaints  Past medical history,surgical history, problem list, medications, allergies, family history and social history were all reviewed and documented as reviewed in the EPIC chart.  ROS:  Performed with pertinent positives and negatives included in the history, assessment and plan.   Additional significant findings : None   Exam: [name] assistant Vitals:   01/27/19 1358  BP: 124/80  Weight: 233 lb (105.7 kg)  Height: 5\' 6"  (1.676 m)   Body mass index is 37.61 kg/m.  General appearance:  Normal affect, orientation and appearance. Skin: Grossly normal HEENT: Without gross lesions.  No cervical or supraclavicular adenopathy. Thyroid normal.  Lungs:  Clear without wheezing, rales or rhonchi Cardiac: RR, without RMG Abdominal:  Soft, nontender, without masses, guarding, rebound, organomegaly or hernia Breasts:  Examined lying and sitting without masses, retractions, discharge or axillary adenopathy. Pelvic:  Ext, BUS, Vagina: Normal with mild atrophic changes  Cervix: Normal with atrophic changes  Uterus: Anteverted, normal size, shape and contour, midline and mobile nontender   Adnexa: Without masses or tenderness    Anus and perineum: Normal   Rectovaginal: Normal sphincter tone without palpated masses or tenderness.    Assessment/Plan:  58 y.o. [id number] female for annual gynecologic exam.   1. Postmenopausal.  No significant menopausal symptoms or any vaginal bleeding. 2. Pap smear/HPV 11/2014.  Pap smear/HPV today.  History of LGSIL 2005 with ASCUS negative high risk HPV 2007.  Normal Pap smears since. 3. Mammography 08/2018.  Continue with annual mammography when due.  Breast exam normal today. 4. Colonoscopy 2019.  Repeat at their recommended interval. 5. DEXA never.  Will plan baseline next year at age 63. 37. Health maintenance.  No  routine lab work done as patient does this elsewhere.  Follow-up 1 year, sooner as needed.   Anastasio Auerbach MD, 2:48 PM 01/27/2019

## 2019-01-27 NOTE — Addendum Note (Signed)
Addended by: Nelva Nay on: 01/27/2019 02:59 PM   Modules accepted: Orders

## 2019-01-27 NOTE — Patient Instructions (Signed)
Follow-up in 1 year, sooner as needed. 

## 2019-01-28 LAB — PAP IG AND HPV HIGH-RISK: HPV DNA High Risk: NOT DETECTED

## 2019-01-30 ENCOUNTER — Encounter: Payer: 59 | Admitting: Gynecology

## 2019-06-18 ENCOUNTER — Ambulatory Visit: Payer: 59 | Admitting: Nurse Practitioner

## 2019-06-19 ENCOUNTER — Other Ambulatory Visit: Payer: Self-pay | Admitting: Nurse Practitioner

## 2019-06-19 DIAGNOSIS — J4520 Mild intermittent asthma, uncomplicated: Secondary | ICD-10-CM

## 2019-06-20 LAB — HM DIABETES EYE EXAM

## 2019-06-23 ENCOUNTER — Ambulatory Visit: Payer: 59 | Admitting: Nurse Practitioner

## 2019-06-24 ENCOUNTER — Telehealth: Payer: Self-pay | Admitting: Nurse Practitioner

## 2019-06-24 NOTE — Telephone Encounter (Signed)

## 2019-06-25 ENCOUNTER — Ambulatory Visit: Payer: 59 | Admitting: Nurse Practitioner

## 2019-06-25 ENCOUNTER — Encounter: Payer: Self-pay | Admitting: Nurse Practitioner

## 2019-06-25 ENCOUNTER — Other Ambulatory Visit: Payer: Self-pay

## 2019-06-25 VITALS — BP 130/82 | HR 72 | Temp 98.9°F | Ht 66.0 in | Wt 236.8 lb

## 2019-06-25 DIAGNOSIS — M778 Other enthesopathies, not elsewhere classified: Secondary | ICD-10-CM | POA: Diagnosis not present

## 2019-06-25 DIAGNOSIS — I10 Essential (primary) hypertension: Secondary | ICD-10-CM

## 2019-06-25 MED ORDER — PENNSAID 2 % TD SOLN: 1.0000 [in_us] | Freq: Two times a day (BID) | TRANSDERMAL | 1 refills | Status: DC

## 2019-06-25 NOTE — Progress Notes (Signed)
Subjective:  Patient ID: Claire Sutton, female    DOB: 1961-09-18  Age: 58 y.o. MRN: [id number]  CC: Follow-up (6 mo f/u--pt also c/o of right arm pain ful,comes and goes for 6 mo/losartan dosage consult?FYI--BP at home run about 127/80)  Arm Pain  The incident occurred more than 1 week ago. The incident occurred at home. The injury mechanism was repetitive motion. The pain is present in the right elbow and right forearm. The quality of the pain is described as aching. The pain does not radiate. The pain has been intermittent since the incident. Pertinent negatives include no chest pain, muscle weakness, numbness or tingling. The symptoms are aggravated by movement, palpation and lifting. She has tried NSAIDs for the symptoms. The treatment provided significant relief.   HTN: Stable with amlodipine and losartan. BP Readings from Last 3 Encounters:  06/25/19 130/82  01/27/19 124/80  01/22/19 134/86   Reviewed past Medical, Social and Family history today.  Outpatient Medications Prior to Visit  Medication Sig Dispense Refill  . albuterol (PROAIR HFA) 108 (90 Base) MCG/ACT inhaler Inhale 1-2 puffs into the lungs every 6 (six) hours as needed for wheezing or shortness of breath. 1 Inhaler 3  . amLODipine (NORVASC) 5 MG tablet Take 1 tablet (5 mg total) by mouth at bedtime. 90 tablet 3  . Cetirizine HCl (ZYRTEC PO) Take 1 tablet by mouth daily.     . Cholecalciferol (VITAMIN D PO) Take 1 tablet by mouth daily.     Marland Kitchen EPINEPHrine 0.3 mg/0.3 mL IJ SOAJ injection use as directed by prescriber  0  . losartan (COZAAR) 25 MG tablet Take 1 tablet (25 mg total) by mouth 2 (two) times daily. 180 tablet 3  . montelukast (SINGULAIR) 10 MG tablet Take 1 tablet (10 mg total) by mouth at bedtime. 90 tablet 3  . Multiple Vitamins-Calcium (ONE-A-DAY WOMENS PO) Take 1 tablet by mouth daily.     Marland Kitchen omeprazole (PRILOSEC) 20 MG capsule Take 1 capsule (20 mg total) by mouth daily. 90 capsule 3  . QVAR  REDIHALER 40 MCG/ACT inhaler INHALE 1 PUFF BY MOUTH TWICE DAILY(RINSE MOUTH AFTER EACH USE) 10.6 g 1  . Diclofenac Sodium (PENNSAID) 2 % SOLN Place 1 inch onto the skin 2 (two) times daily. 112 g 1   No facility-administered medications prior to visit.     ROS See HPI  Objective:  BP 130/82   Pulse 72   Temp 98.9 F (37.2 C) (Oral)   Ht 5\' 6"  (1.676 m)   Wt 236 lb 12.8 oz (107.4 kg)   SpO2 96%   BMI 38.22 kg/m   BP Readings from Last 3 Encounters:  06/25/19 130/82  01/27/19 124/80  01/22/19 134/86    Wt Readings from Last 3 Encounters:  06/25/19 236 lb 12.8 oz (107.4 kg)  01/27/19 233 lb (105.7 kg)  01/22/19 233 lb 9.6 oz (106 kg)    Physical Exam Vitals signs reviewed.  Cardiovascular:     Rate and Rhythm: Normal rate and regular rhythm.     Pulses: Normal pulses.     Heart sounds: Normal heart sounds.  Pulmonary:     Effort: Pulmonary effort is normal.     Breath sounds: Normal breath sounds.  Abdominal:     General: Bowel sounds are normal.     Palpations: Abdomen is soft.  Musculoskeletal: Normal range of motion.        General: Tenderness present. No swelling or deformity.  Right elbow: She exhibits normal range of motion, no swelling and no effusion. Tenderness found. Lateral epicondyle tenderness noted. No radial head, no medial epicondyle and no olecranon process tenderness noted.     Right wrist: Normal.     Right forearm: Normal.     Right hand: Normal.     Right lower leg: No edema.     Left lower leg: No edema.  Neurological:     Mental Status: She is alert.     Lab Results  Component Value Date   WBC 3.8 (L) 12/18/2018   HGB 13.8 12/18/2018   HCT 41.7 12/18/2018   PLT 231.0 12/18/2018   GLUCOSE 97 12/18/2018   CHOL 182 12/18/2018   TRIG 78.0 12/18/2018   HDL 43.00 12/18/2018   LDLCALC 123 (H) 12/18/2018   ALT 28 01/22/2019   AST 20 01/22/2019   NA 141 12/18/2018   K 3.6 12/18/2018   CL 108 12/18/2018   CREATININE 0.88 12/18/2018    BUN 13 12/18/2018   CO2 23 12/18/2018   TSH 1.70 12/18/2018    Mm 3d Screen Breast Bilateral  Result Date: 08/19/2018 CLINICAL DATA:  Screening. EXAM: DIGITAL SCREENING BILATERAL MAMMOGRAM WITH TOMO AND CAD COMPARISON:  Previous exam(s). ACR Breast Density Category b: There are scattered areas of fibroglandular density. FINDINGS: There are no findings suspicious for malignancy. Images were processed with CAD. IMPRESSION: No mammographic evidence of malignancy. A result letter of this screening mammogram will be mailed directly to the patient. RECOMMENDATION: Screening mammogram in one year. (Code:SM-B-01Y) BI-RADS CATEGORY  1: Negative. Electronically Signed   By: Ammie Ferrier M.D.   On: 08/19/2018 14:29   Assessment & Plan:   Claire Sutton was seen today for follow-up.  Diagnoses and all orders for this visit:  Essential hypertension  Right elbow tendonitis -     Diclofenac Sodium (PENNSAID) 2 % SOLN; Place 1 inch onto the skin 2 (two) times daily.   I am having Claire Sutton maintain her Multiple Vitamins-Calcium (ONE-A-DAY WOMENS PO), Cetirizine HCl (ZYRTEC PO), Cholecalciferol (VITAMIN D PO), EPINEPHrine, albuterol, amLODipine, losartan, montelukast, omeprazole, Qvar RediHaler, and Pennsaid.  Meds ordered this encounter  Medications  . Diclofenac Sodium (PENNSAID) 2 % SOLN    Sig: Place 1 inch onto the skin 2 (two) times daily.    Dispense:  112 g    Refill:  1    Order Specific Question:   Supervising Provider    Answer:   Lucille Passy [[id number]]    Problem List Items Addressed This Visit      Cardiovascular and Mediastinum   Essential hypertension - Primary     Other   Chronic right hip pain   Relevant Medications   Diclofenac Sodium (PENNSAID) 2 % SOLN       Follow-up: Return in about 6 months (around 12/26/2019) for CPE (fasting).  Wilfred Lacy, NP

## 2019-06-25 NOTE — Addendum Note (Signed)
Addended byShawnie Pons on: 06/25/2019 11:44 AM   Modules accepted: Orders

## 2019-06-25 NOTE — Patient Instructions (Addendum)
Use pennsaid gel as need for elbow pain. May also use elbow sleeve to stabilize joint.  Pitcher's Elbow  Pitcher's elbow is a type of elbow injury that develops gradually over time (overuse injury). This condition is also called valgus extension overload syndrome (VEOS). This injury is common in athletes who make repeated overhead throwing motions, such as a baseball pitcher repeatedly throwing a ball. Throwing motions can:  Overstretch the strong band of tissue (ligament) on the inside of the elbow (ulnar collateral ligament, or UCL). UCL injuries can range from minor inflammation to a complete ligament tear.  Push the bones at the outside and back of the elbow together (compression). These forces can injure the ligament over time and create an abnormal extra bone in the elbow (osteophyte or bone spur). What are the causes? This condition may be caused by:  Continuous or repetitive overhead throwing, such as baseball pitching.  Improper throwing motion.  Tightness in the shoulder that puts added demand on the elbow. What increases the risk? This condition is more likely to develop in athletes who play sports that involve repetitive forceful straightening of the elbow, such as:  Baseball or softball.  Tennis and other racquet sports.  Football.  Lacrosse.  Gymnastics.  Javelin. What are the signs or symptoms? Symptoms of this condition include:  Elbow pain.  Increased pain in your elbow as you straighten your arm forcefully, such as when throwing.  Swelling.  Limited range of motion or "locking" of the elbow.  Popping or tearing sensation in your elbow. How is this diagnosed? This condition is diagnosed based on:  Your symptoms and medical history.  A physical exam. Your health care provider may: ? Check your elbow's strength, stability, and range of motion. ? Compare your injured elbow to your other elbow. ? Gently press your arm and elbow to find the source of  pain.  Imaging tests, such as: ? X-rays or CT scans to check for stress fractures and bone spurs. ? Ultrasound or MRI to check for tears in the ligaments or tendons. How is this treated? Treatment for this condition may include:  Stopping activities that require overhead arm motions, then returning gradually to full activities.  Modifying your sports technique to decrease elbow strain. This may include wearing a brace.  Taking medicine to relieve pain.  Injecting medicines (corticosteroids) into your elbow to reduce swelling and pain.  Doing strength and range-of-motion exercises (physical therapy) as told by your health care provider.  Surgery. This may be needed if all other treatments do not work. It may involve: ? Repairing damaged ligaments. ? Removing abnormal bone growths. ? Removing pieces of bone or cartilage. After surgery, you will have to wear a brace for several weeks and eventually have physical therapy. Follow these instructions at home: If you have a brace:  Wear the brace as told by your health care provider. Remove it only as told by your health care provider.  Loosen the brace if your fingers tingle, become numb, or turn cold and blue.  Keep the brace clean.  If the brace is not waterproof: ? Do not let it get wet. ? Cover it with a watertight covering when you take a bath or a shower. Managing pain, stiffness, and swelling   If directed, put ice on the injured area. ? If you have a removable brace, remove it as told by your health care provider. ? Put ice in a plastic bag. ? Place a towel between your skin  and the bag. ? Leave the ice on for 20 minutes, 2-3 times a day.  Move your fingers often to avoid stiffness and to lessen swelling.  Raise (elevate) the injured area above the level of your heart while you are sitting or lying down. Activity  Rest your elbow and avoid activities that require overhead arm motions as told by your health care  provider.  Return to your normal activities as told by your health care provider. Ask your health care provider what activities are safe for you.  Do exercises as told by your health care provider. General instructions  Do not use any products that contain nicotine or tobacco, such as cigarettes, e-cigarettes, and chewing tobacco. These can delay healing. If you need help quitting, ask your health care provider.  Take over-the-counter and prescription medicines only as told by your health care provider.  Keep all follow-up visits as told by your health care provider. This is important. How is this prevented?  Warm up and stretch before being active.  Cool down and stretch after being active.  Give your body time to rest between periods of activity.  Maintain physical fitness, including: ? Strength. ? Flexibility.  Have your technique checked to make sure you use proper form.  Rest your elbow if you show signs of fatigue.  When you start any new athletic activity, increase your participation slowly.  Follow the rules for your sport on how often and how much you can throw in a game.  Avoid overhead throwing motions as told by your health care provider. Contact a health care provider if:  Your pain does not improve or it gets worse after 2-4 weeks of rest. Get help right away if:  Your pain is severe.  You cannot move your arm or elbow. These symptoms may represent a serious problem that is an emergency. Do not wait to see if the symptoms will go away. Get medical help right away. Call your local emergency services (911 in the [place] Do not drive yourself to the hospital. Summary  Pitcher's elbow is a type of elbow injury that develops gradually over time.  Treatment depends on the severity of the injury.  Rest your elbow and avoid activities that require overhead arm motions as told by your health care provider.  If directed, put ice on the injured area. This  information is not intended to replace advice given to you by your health care provider. Make sure you discuss any questions you have with your health care provider. Document Released: 10/23/2005 Document Revised: 02/13/2019 Document Reviewed: 04/18/2018 Elsevier Patient Education  2020 [employer].

## 2019-08-05 ENCOUNTER — Encounter: Payer: Self-pay | Admitting: Gynecology

## 2019-08-15 LAB — HM MAMMOGRAPHY

## 2019-08-21 ENCOUNTER — Encounter: Payer: Self-pay | Admitting: Nurse Practitioner

## 2019-08-21 NOTE — Progress Notes (Signed)
Abstracted result and sent to scan  

## 2019-09-02 ENCOUNTER — Encounter: Payer: Self-pay | Admitting: Nurse Practitioner

## 2019-09-10 ENCOUNTER — Encounter: Payer: Self-pay | Admitting: Nurse Practitioner

## 2019-09-10 NOTE — Progress Notes (Signed)
Abstracted result and sent to scan  

## 2019-09-26 ENCOUNTER — Encounter: Payer: Self-pay | Admitting: Gynecology

## 2019-09-26 ENCOUNTER — Encounter: Payer: Self-pay | Admitting: Nurse Practitioner

## 2019-09-29 ENCOUNTER — Encounter: Payer: Self-pay | Admitting: Gynecology

## 2019-12-26 ENCOUNTER — Encounter: Payer: 59 | Admitting: Nurse Practitioner

## 2020-01-06 ENCOUNTER — Other Ambulatory Visit: Payer: Self-pay

## 2020-01-07 ENCOUNTER — Ambulatory Visit (INDEPENDENT_AMBULATORY_CARE_PROVIDER_SITE_OTHER): Payer: PRIVATE HEALTH INSURANCE | Admitting: Nurse Practitioner

## 2020-01-07 ENCOUNTER — Encounter: Payer: Self-pay | Admitting: Nurse Practitioner

## 2020-01-07 VITALS — BP 118/80 | HR 62 | Temp 97.7°F | Ht 66.0 in | Wt 230.4 lb

## 2020-01-07 DIAGNOSIS — E785 Hyperlipidemia, unspecified: Secondary | ICD-10-CM

## 2020-01-07 DIAGNOSIS — J4520 Mild intermittent asthma, uncomplicated: Secondary | ICD-10-CM | POA: Diagnosis not present

## 2020-01-07 DIAGNOSIS — K219 Gastro-esophageal reflux disease without esophagitis: Secondary | ICD-10-CM

## 2020-01-07 DIAGNOSIS — I10 Essential (primary) hypertension: Secondary | ICD-10-CM

## 2020-01-07 DIAGNOSIS — Z0000 Encounter for general adult medical examination without abnormal findings: Secondary | ICD-10-CM

## 2020-01-07 LAB — CBC
HCT: 42.5 % (ref 36.0–46.0)
Hemoglobin: 14.2 g/dL (ref 12.0–15.0)
MCHC: 33.4 g/dL (ref 30.0–36.0)
MCV: 86.5 fl (ref 78.0–100.0)
Platelets: 208 10*3/uL (ref 150.0–400.0)
RBC: 4.92 Mil/uL (ref 3.87–5.11)
RDW: 14.7 % (ref 11.5–15.5)
WBC: 3.4 10*3/uL — ABNORMAL LOW (ref 4.0–10.5)

## 2020-01-07 LAB — COMPREHENSIVE METABOLIC PANEL
ALT: 25 U/L (ref 0–35)
AST: 22 U/L (ref 0–37)
Albumin: 3.9 g/dL (ref 3.5–5.2)
Alkaline Phosphatase: 96 U/L (ref 39–117)
BUN: 14 mg/dL (ref 6–23)
CO2: 27 mEq/L (ref 19–32)
Calcium: 9.7 mg/dL (ref 8.4–10.5)
Chloride: 105 mEq/L (ref 96–112)
Creatinine, Ser: 0.96 mg/dL (ref 0.40–1.20)
GFR: 71.75 mL/min (ref >60.00)
Glucose, Bld: 94 mg/dL (ref 70–99)
Potassium: 3.9 mEq/L (ref 3.5–5.1)
Sodium: 138 mEq/L (ref 135–145)
Total Bilirubin: 0.7 mg/dL (ref 0.2–1.2)
Total Protein: 7.6 g/dL (ref 6.0–8.3)

## 2020-01-07 LAB — LIPID PANEL
Cholesterol: 219 mg/dL — ABNORMAL HIGH (ref 0–200)
HDL: 49.1 mg/dL (ref >39.00)
LDL Cholesterol: 158 mg/dL — ABNORMAL HIGH (ref 0–99)
NonHDL: 169.77
Total CHOL/HDL Ratio: 4
Triglycerides: 61 mg/dL (ref 0.0–149.0)
VLDL: 12.2 mg/dL (ref 0.0–40.0)

## 2020-01-07 MED ORDER — AMLODIPINE BESYLATE 5 MG PO TABS: 5.0000 mg | ORAL_TABLET | Freq: Every day | ORAL | 3 refills | Status: DC

## 2020-01-07 MED ORDER — OMEPRAZOLE 20 MG PO CPDR: 20.0000 mg | DELAYED_RELEASE_CAPSULE | Freq: Every day | ORAL | 3 refills | Status: DC

## 2020-01-07 MED ORDER — LOSARTAN POTASSIUM 25 MG PO TABS: 25.0000 mg | ORAL_TABLET | Freq: Every day | ORAL | 3 refills | Status: DC

## 2020-01-07 MED ORDER — MONTELUKAST SODIUM 10 MG PO TABS: 10.0000 mg | ORAL_TABLET | Freq: Every day | ORAL | 3 refills | Status: DC

## 2020-01-07 NOTE — Patient Instructions (Addendum)
Ok to decrease losartan to '25mg'$  daily Continue other medications as prescribed continue to monitor BP, call office if BP >140/90. Keep up the good job with diet and exercise.  Normal renal and liver function Stable cbc Abnormal lipid panel: elevated TC and LDL. You have a 5.9% risk of developing cardiovascular disease over the next 63yr. Which is >2.8% for someone in your age group. with recent change in diet and daily exercise, I recommend you continue with that for now. We will continue to monitor this every 613month   Preventive Care [id number] Old, Female Preventive care refers to visits with your health care provider and lifestyle choices that can promote health and wellness. This includes:  A yearly physical exam. This may also be called an annual well check.  Regular dental visits and eye exams.  Immunizations.  Screening for certain conditions.  Healthy lifestyle choices, such as eating a healthy diet, getting regular exercise, not using drugs or products that contain nicotine and tobacco, and limiting alcohol use. What can I expect for my preventive care visit? Physical exam Your health care provider will check your:  Height and weight. This may be used to calculate body mass index (BMI), which tells if you are at a healthy weight.  Heart rate and blood pressure.  Skin for abnormal spots. Counseling Your health care provider may ask you questions about your:  Alcohol, tobacco, and drug use.  Emotional well-being.  Home and relationship well-being.  Sexual activity.  Eating habits.  Work and work enStatistician Method of birth control.  Menstrual cycle.  Pregnancy history. What immunizations do I need?  Influenza (flu) vaccine  This is recommended every year. Tetanus, diphtheria, and pertussis (Tdap) vaccine  You may need a Td booster every 10 years. Varicella (chickenpox) vaccine  You may need this if you have not been vaccinated. Zoster  (shingles) vaccine  You may need this after age 7146Measles, mumps, and rubella (MMR) vaccine  You may need at least one dose of MMR if you were born in 1957 or later. You may also need a second dose. Pneumococcal conjugate (PCV13) vaccine  You may need this if you have certain conditions and were not previously vaccinated. Pneumococcal polysaccharide (PPSV23) vaccine  You may need one or two doses if you smoke cigarettes or if you have certain conditions. Meningococcal conjugate (MenACWY) vaccine  You may need this if you have certain conditions. Hepatitis A vaccine  You may need this if you have certain conditions or if you travel or work in places where you may be exposed to hepatitis A. Hepatitis B vaccine  You may need this if you have certain conditions or if you travel or work in places where you may be exposed to hepatitis B. Haemophilus influenzae type b (Hib) vaccine  You may need this if you have certain conditions. Human papillomavirus (HPV) vaccine  If recommended by your health care provider, you may need three doses over 6 months. You may receive vaccines as individual doses or as more than one vaccine together in one shot (combination vaccines). Talk with your health care provider about the risks and benefits of combination vaccines. What tests do I need? Blood tests  Lipid and cholesterol levels. These may be checked every 5 years, or more frequently if you are over 5072ears old.  Hepatitis C test.  Hepatitis B test. Screening  Lung cancer screening. You may have this screening every year starting at age 6149f you have  a 30-pack-year history of smoking and currently smoke or have quit within the past 15 years.  Colorectal cancer screening. All adults should have this screening starting at age 47 and continuing until age 1. Your health care provider may recommend screening at age 68 if you are at increased risk. You will have tests every 1-10 years, depending  on your results and the type of screening test.  Diabetes screening. This is done by checking your blood sugar (glucose) after you have not eaten for a while (fasting). You may have this done every 1-3 years.  Mammogram. This may be done every 1-2 years. Talk with your health care provider about when you should start having regular mammograms. This may depend on whether you have a family history of breast cancer.  BRCA-related cancer screening. This may be done if you have a family history of breast, ovarian, tubal, or peritoneal cancers.  Pelvic exam and Pap test. This may be done every 3 years starting at age 14. Starting at age 52, this may be done every 5 years if you have a Pap test in combination with an HPV test. Other tests  Sexually transmitted disease (STD) testing.  Bone density scan. This is done to screen for osteoporosis. You may have this scan if you are at high risk for osteoporosis. Follow these instructions at home: Eating and drinking  Eat a diet that includes fresh fruits and vegetables, whole grains, lean protein, and low-fat dairy.  Take vitamin and mineral supplements as recommended by your health care provider.  Do not drink alcohol if: ? Your health care provider tells you not to drink. ? You are pregnant, may be pregnant, or are planning to become pregnant.  If you drink alcohol: ? Limit how much you have to 0-1 drink a day. ? Be aware of how much alcohol is in your drink. In the [place] one drink equals one 12 oz bottle of beer (355 mL), one 5 oz glass of wine (148 mL), or one 1 oz glass of hard liquor (44 mL). Lifestyle  Take daily care of your teeth and gums.  Stay active. Exercise for at least 30 minutes on 5 or more days each week.  Do not use any products that contain nicotine or tobacco, such as cigarettes, e-cigarettes, and chewing tobacco. If you need help quitting, ask your health care provider.  If you are sexually active, practice safe sex. Use  a condom or other form of birth control (contraception) in order to prevent pregnancy and STIs (sexually transmitted infections).  If told by your health care provider, take low-dose aspirin daily starting at age 24. What's next?  Visit your health care provider once a year for a well check visit.  Ask your health care provider how often you should have your eyes and teeth checked.  Stay up to date on all vaccines. This information is not intended to replace advice given to you by your health care provider. Make sure you discuss any questions you have with your health care provider. Document Revised: 07/04/2018 Document Reviewed: 07/04/2018 Elsevier Patient Education  2020 [employer].

## 2020-01-07 NOTE — Progress Notes (Signed)
Subjective:    Patient ID: Claire Sutton, female    DOB: August 06, 1961, 59 y.o.   MRN: [id number]  Patient presents today for complete physical and eval of chronic conditions  HPI  HTN: BP at goal. Reports occasional low SBP readings 90s She has made changes to her diet and started daily exercise regimen. BP Readings from Last 3 Encounters:  01/07/20 118/80  06/25/19 130/82  01/27/19 124/80   Wt Readings from Last 3 Encounters:  01/07/20 230 lb 6.4 oz (104.5 kg)  06/25/19 236 lb 12.8 oz (107.4 kg)  01/27/19 233 lb (105.7 kg)   Sexual History (orientation,birth control, marital status, STD):single, not sexually active, postmenopausal  Depression/Suicide: Depression screen [facility] 2/9 01/07/2020 12/18/2018 06/13/2018 08/15/2017 09/07/2016  Decreased Interest 0 0 0 0 0  Down, Depressed, Hopeless 0 0 0 0 0  PHQ - 2 Score 0 0 0 0 0   Vision:up to date  Dental:up to date  Immunizations: (TDAP, Hep C screen, Pneumovax, Influenza, zoster)  Health Maintenance  Topic Date Due  . Flu Shot  02/04/2020*  . Mammogram  09/01/2021  . Pap Smear  01/26/2022  . Colon Cancer Screening  09/24/2023  . Tetanus Vaccine  04/19/2027  .  Hepatitis C: One time screening is recommended by Center for Disease Control  (CDC) for  adults born from [birth year] through 1966.   Completed  . HIV Screening  Completed  *Topic was postponed. The date shown is not the original due date.   Fall Risk: Fall Risk  01/07/2020 01/07/2020 12/18/2018 06/13/2018 08/15/2017 09/07/2016  Falls in the past year? 1 1 0 No Yes No  Number falls in past yr: 0 0 - - 1 -  Injury with Fall? 0 0 - - No -   Advanced Directive: Advanced Directives 12/18/2018  Does Patient Have a Medical Advance Directive? No  Would patient like information on creating a medical advance directive? Yes (MAU/Ambulatory/Procedural Areas - Information given)     Medications and allergies reviewed with patient and updated if appropriate.  Patient Active Problem List     Diagnosis Date Noted  . Left breast mass 01/09/2020  . Postmenopausal estrogen deficiency 01/22/2019  . History of colon polyps 06/13/2018  . Chronic right hip pain 03/07/2018  . Asthma 03/14/2017  . Shortness of breath on exertion 10/18/2016  . Abnormal EKG 10/18/2016  . Dyslipidemia 10/18/2016  . Essential hypertension 09/15/2016  . Obesity   . Gastroesophageal reflux disease without esophagitis 09/07/2016  . Acute bronchitis 09/07/2016  . Intermediate uveitis of left eye 06/16/2014  . Scleritis and episcleritis of left eye 06/16/2014    Current Outpatient Medications on File Prior to Visit  Medication Sig Dispense Refill  . albuterol (PROAIR HFA) 108 (90 Base) MCG/ACT inhaler Inhale 1-2 puffs into the lungs every 6 (six) hours as needed for wheezing or shortness of breath. 1 Inhaler 3  . Cetirizine HCl (ZYRTEC PO) Take 1 tablet by mouth daily.     . Cholecalciferol (VITAMIN D PO) Take 1 tablet by mouth daily.     . Diclofenac Sodium (PENNSAID) 2 % SOLN Place 1 inch onto the skin 2 (two) times daily. 112 g 1  . EPINEPHrine 0.3 mg/0.3 mL IJ SOAJ injection use as directed by prescriber  0  . Multiple Vitamins-Calcium (ONE-A-DAY WOMENS PO) Take 1 tablet by mouth daily.     Marland Kitchen QVAR REDIHALER 40 MCG/ACT inhaler INHALE 1 PUFF BY MOUTH TWICE DAILY(RINSE MOUTH AFTER EACH USE) 10.6 g 1  No current facility-administered medications on file prior to visit.    Past Medical History:  Diagnosis Date  . Allergy   . ASCUS (atypical squamous cells of undetermined significance) on Pap smear 07/2006   NEG HIGH RISK HPV-- THEN IN 10/2006 AND 07/2007 PAPS WITH BENIGN REACTIVE CHANGES  . Asthma   . GERD (gastroesophageal reflux disease)   . Headache   . History of blood clot in brain    a. Fall age 24 - was told she had a blood clot in the brain from this.  . Hyperlipidemia   . Hypertension   . LGSIL (low grade squamous intraepithelial dysplasia) 10/05   PAP ON 02/2005, 7,8,9,10,11,12WNL  .  Obesity   . Seizures ([place])    a. began after she had a fall at age 4, last seizure age 67.    Past Surgical History:  Procedure Laterality Date  . COLPOSCOPY    . ENDOMETRIAL ABLATION  02/2007   NOVASURE/MARSUPILIZATION OF BARTHOLIN  . MARSUPILIZATION OF BARTHOLIN  02/2007   AT TIME OF NOVASURE ABLATION SURGERY  . TUBAL LIGATION      Social History   Socioeconomic History  . Marital status: Single    Spouse name: Not on file  . Number of children: Not on file  . Years of education: Not on file  . Highest education level: Not on file  Occupational History  . Not on file  Tobacco Use  . Smoking status: Never Smoker  . Smokeless tobacco: Never Used  Substance and Sexual Activity  . Alcohol use: No    Alcohol/week: 0.0 standard drinks  . Drug use: No  . Sexual activity: Not Currently    Birth control/protection: Post-menopausal    Comment: 1st intercourse 59 yo-More than 5 partners  Other Topics Concern  . Not on file  Social History Narrative  . Not on file   Social Determinants of Health   Financial Resource Strain:   . Difficulty of Paying Living Expenses: Not on file  Food Insecurity:   . Worried About Charity fundraiser in the Last Year: Not on file  . Ran Out of Food in the Last Year: Not on file  Transportation Needs:   . Lack of Transportation (Medical): Not on file  . Lack of Transportation (Non-Medical): Not on file  Physical Activity:   . Days of Exercise per Week: Not on file  . Minutes of Exercise per Session: Not on file  Stress:   . Feeling of Stress : Not on file  Social Connections:   . Frequency of Communication with Friends and Family: Not on file  . Frequency of Social Gatherings with Friends and Family: Not on file  . Attends Religious Services: Not on file  . Active Member of Clubs or Organizations: Not on file  . Attends Archivist Meetings: Not on file  . Marital Status: Not on file    Family History  Problem Relation Age  of Onset  . Hypertension Mother   . Hyperlipidemia Mother   . Heart disease Mother        was told she may have had a few MIs that she was unaware of  . Diabetes Father   . Hypertension Sister   . Diabetes Paternal Grandfather   . Colon cancer Neg Hx   . Esophageal cancer Neg Hx   . Rectal cancer Neg Hx   . Stomach cancer Neg Hx        Review of Systems  Constitutional: Negative for fever, malaise/fatigue and weight loss.  HENT: Negative for congestion and sore throat.   Eyes:       Negative for visual changes  Respiratory: Negative for cough and shortness of breath.   Cardiovascular: Negative for chest pain, palpitations and leg swelling.  Gastrointestinal: Negative for blood in stool, constipation, diarrhea and heartburn.  Genitourinary: Negative for dysuria, frequency and urgency.  Musculoskeletal: Negative for falls, joint pain and myalgias.  Skin: Negative for rash.  Neurological: Negative for dizziness, sensory change and headaches.  Endo/Heme/Allergies: Does not bruise/bleed easily.  Psychiatric/Behavioral: Negative for depression, substance abuse and suicidal ideas. The patient is not nervous/anxious.    Objective:   Vitals:   01/07/20 0926  BP: 118/80  Pulse: 62  Temp: 97.7 F (36.5 C)  SpO2: 100%   Body mass index is 37.19 kg/m.   Physical Examination:  Physical Exam Vitals reviewed.  Constitutional:      General: She is not in acute distress.    Appearance: She is well-developed. She is obese.  HENT:     Right Ear: Tympanic membrane, ear canal and external ear normal.     Left Ear: Tympanic membrane, ear canal and external ear normal.  Eyes:     Extraocular Movements: Extraocular movements intact.     Conjunctiva/sclera: Conjunctivae normal.  Cardiovascular:     Rate and Rhythm: Normal rate and regular rhythm.     Pulses: Normal pulses.     Heart sounds: Normal heart sounds.  Pulmonary:     Effort: Pulmonary effort is normal. No respiratory  distress.     Breath sounds: Normal breath sounds.  Chest:     Chest wall: No tenderness.  Abdominal:     General: Bowel sounds are normal.     Palpations: Abdomen is soft.  Genitourinary:    Comments: Deferred to gyn per patient Musculoskeletal:        General: Normal range of motion.     Cervical back: Normal range of motion and neck supple.     Right lower leg: No edema.     Left lower leg: No edema.  Lymphadenopathy:     Cervical: No cervical adenopathy.  Skin:    General: Skin is warm and dry.  Neurological:     Mental Status: She is alert and oriented to person, place, and time.     Deep Tendon Reflexes: Reflexes are normal and symmetric.  Psychiatric:        Mood and Affect: Mood normal.        Behavior: Behavior normal.        Thought Content: Thought content normal.     ASSESSMENT and PLAN: This visit occurred during the SARS-CoV-2 public health emergency.  Safety protocols were in place, including screening questions prior to the visit, additional usage of staff PPE, and extensive cleaning of exam room while observing appropriate contact time as indicated for disinfecting solutions.   Claire Sutton was seen today for annual exam.  Diagnoses and all orders for this visit:  Preventative health care -     CBC -     Comprehensive metabolic panel  Essential hypertension -     Lipid panel -     losartan (COZAAR) 25 MG tablet; Take 1 tablet (25 mg total) by mouth daily. -     amLODipine (NORVASC) 5 MG tablet; Take 1 tablet (5 mg total) by mouth at bedtime.  Dyslipidemia  Mild intermittent asthma without complication -     montelukast (  SINGULAIR) 10 MG tablet; Take 1 tablet (10 mg total) by mouth at bedtime.  Gastroesophageal reflux disease without esophagitis -     omeprazole (PRILOSEC) 20 MG capsule; Take 1 capsule (20 mg total) by mouth daily.   Dyslipidemia Abnormal lipid panel: elevated TC and LDL. You have a 5.9% risk of developing cardiovascular disease over  the next 22yrs. Which is >2.8% for someone in your age group. with recent change in diet and daily exercise, I recommend you continue with that for now. We will continue to monitor this every 12months.   Gastroesophageal reflux disease without esophagitis Stable with omeprazole  Asthma Controlled Does not need ICS nor SABA. Continue montelukast  Essential hypertension BP at goal. Reports occasional low SBP readings 90s She has made changes to her diet and started daily exercise regimen. BP Readings from Last 3 Encounters:  01/07/20 118/80  06/25/19 130/82  01/27/19 124/80   Ok to decrease losartan to 25mg  daily Continue other medications as prescribed continue to monitor BP, call office if BP >140/90. F/up in 60months     Problem List Items Addressed This Visit      Cardiovascular and Mediastinum   Essential hypertension    BP at goal. Reports occasional low SBP readings 90s She has made changes to her diet and started daily exercise regimen. BP Readings from Last 3 Encounters:  01/07/20 118/80  06/25/19 130/82  01/27/19 124/80   Ok to decrease losartan to 25mg  daily Continue other medications as prescribed continue to monitor BP, call office if BP >140/90. F/up in 63months      Relevant Medications   losartan (COZAAR) 25 MG tablet   amLODipine (NORVASC) 5 MG tablet   Other Relevant Orders   Lipid panel (Completed)     Respiratory   Asthma    Controlled Does not need ICS nor SABA. Continue montelukast      Relevant Medications   montelukast (SINGULAIR) 10 MG tablet     Digestive   Gastroesophageal reflux disease without esophagitis    Stable with omeprazole      Relevant Medications   omeprazole (PRILOSEC) 20 MG capsule     Other   Dyslipidemia    Abnormal lipid panel: elevated TC and LDL. You have a 5.9% risk of developing cardiovascular disease over the next 75yrs. Which is >2.8% for someone in your age group. with recent change in diet and daily  exercise, I recommend you continue with that for now. We will continue to monitor this every 50months.        Other Visit Diagnoses    Preventative health care    -  Primary   Relevant Orders   CBC (Completed)   Comprehensive metabolic panel (Completed)       Follow up: Return in about 6 months (around 07/09/2020) for HTN (video, needs repeat lipid panel-fasting).  Wilfred Lacy, NP

## 2020-01-09 DIAGNOSIS — N6320 Unspecified lump in the left breast, unspecified quadrant: Secondary | ICD-10-CM | POA: Insufficient documentation

## 2020-01-09 NOTE — Assessment & Plan Note (Signed)
Controlled Does not need ICS nor SABA. Continue montelukast

## 2020-01-09 NOTE — Assessment & Plan Note (Addendum)
BP at goal. Reports occasional low SBP readings 90s She has made changes to her diet and started daily exercise regimen. BP Readings from Last 3 Encounters:  01/07/20 118/80  06/25/19 130/82  01/27/19 124/80   Ok to decrease losartan to 25mg  daily Continue other medications as prescribed continue to monitor BP, call office if BP >140/90. F/up in 20months

## 2020-01-09 NOTE — Assessment & Plan Note (Signed)
>>  ASSESSMENT AND PLAN FOR REACTIVE AIRWAY DISEASE WRITTEN ON 01/09/2020 11:40 AM BY Jeda Pardue LUM, NP  Controlled Does not need ICS nor SABA. Continue montelukast

## 2020-01-09 NOTE — Assessment & Plan Note (Signed)
Abnormal lipid panel: elevated TC and LDL. You have a 5.9% risk of developing cardiovascular disease over the next 19yrs. Which is >2.8% for someone in your age group. with recent change in diet and daily exercise, I recommend you continue with that for now. We will continue to monitor this every 3months.

## 2020-01-09 NOTE — Assessment & Plan Note (Signed)
Stable with omeprazole

## 2020-01-19 ENCOUNTER — Telehealth: Payer: Self-pay | Admitting: Nurse Practitioner

## 2020-01-19 DIAGNOSIS — J0100 Acute maxillary sinusitis, unspecified: Secondary | ICD-10-CM

## 2020-01-19 MED ORDER — AMOXICILLIN-POT CLAVULANATE 875-125 MG PO TABS: 1.0000 | ORAL_TABLET | Freq: Two times a day (BID) | ORAL | 0 refills | Status: DC

## 2020-01-19 NOTE — Telephone Encounter (Signed)
Would she prefer to try oral abx prior to going to ENT?

## 2020-01-19 NOTE — Telephone Encounter (Signed)
Pt agree to try abx first and if its does work please refer the pt to ENT.   Pt is aware that Claire Sutton going to send in abx to [facility] and ENT referral. Pt will call the dentis get copy of x-ray fax over to our office.

## 2020-01-19 NOTE — Telephone Encounter (Signed)
Patient is calling and wanting to speak to someone regarding getting a referral to see a ENT Specialist. CB is [phone]

## 2020-01-19 NOTE — Telephone Encounter (Signed)
Pt request referral from PCP to ENT doctor on [address] [place]. Pt stated she went to see her dentis & dental surgeon who did her root canal and both of them thought that her symptoms might be related to sinus infections: tooth being sensitive,feel like a toothache,drainage at night only. They did x-ray as well and couldn't see a root of teeth.   [place] please advise, pt request ENT on [address] [place]

## 2020-01-20 ENCOUNTER — Encounter: Payer: Self-pay | Admitting: Nurse Practitioner

## 2020-01-26 ENCOUNTER — Other Ambulatory Visit: Payer: Self-pay | Admitting: Nurse Practitioner

## 2020-01-26 DIAGNOSIS — J4520 Mild intermittent asthma, uncomplicated: Secondary | ICD-10-CM

## 2020-01-26 DIAGNOSIS — I10 Essential (primary) hypertension: Secondary | ICD-10-CM

## 2020-01-28 ENCOUNTER — Other Ambulatory Visit: Payer: Self-pay

## 2020-01-29 ENCOUNTER — Encounter: Payer: Self-pay | Admitting: Obstetrics and Gynecology

## 2020-01-29 ENCOUNTER — Encounter (INDEPENDENT_AMBULATORY_CARE_PROVIDER_SITE_OTHER): Payer: Self-pay | Admitting: Otolaryngology

## 2020-01-29 ENCOUNTER — Ambulatory Visit (INDEPENDENT_AMBULATORY_CARE_PROVIDER_SITE_OTHER): Payer: PRIVATE HEALTH INSURANCE | Admitting: Otolaryngology

## 2020-01-29 ENCOUNTER — Ambulatory Visit (INDEPENDENT_AMBULATORY_CARE_PROVIDER_SITE_OTHER): Payer: PRIVATE HEALTH INSURANCE | Admitting: Obstetrics and Gynecology

## 2020-01-29 VITALS — BP 124/82 | Ht 65.5 in | Wt 230.0 lb

## 2020-01-29 VITALS — Temp 98.1°F

## 2020-01-29 DIAGNOSIS — Z13820 Encounter for screening for osteoporosis: Secondary | ICD-10-CM | POA: Diagnosis not present

## 2020-01-29 DIAGNOSIS — N8189 Other female genital prolapse: Secondary | ICD-10-CM

## 2020-01-29 DIAGNOSIS — Z01419 Encounter for gynecological examination (general) (routine) without abnormal findings: Secondary | ICD-10-CM

## 2020-01-29 DIAGNOSIS — J310 Chronic rhinitis: Secondary | ICD-10-CM | POA: Diagnosis not present

## 2020-01-29 NOTE — Progress Notes (Signed)
HPI: Claire Sutton is a 59 y.o. female who presents is referred by her PCP for evaluation of chronic sinus issues.  She complains of hearing her nose pop at night.  She sometimes hears her heartbeat in the left ear at night.  She really does not have pressure in her head but recently had pressure in her right cheek and right teeth and was seen by her dentist who performed x-rays and felt like the dental pain was secondary to a right maxillary sinus infection.  She was treated with Augmentin for 10 days and the right sided cheek pressure and dental pain is better now.  She has had no yellow-green discharge from her nose but mild congestion.. She is presently on Zyrtec and Singulair.  Past Medical History:  Diagnosis Date  . Allergy   . ASCUS (atypical squamous cells of undetermined significance) on Pap smear 07/2006   NEG HIGH RISK HPV-- THEN IN 10/2006 AND 07/2007 PAPS WITH BENIGN REACTIVE CHANGES  . Asthma   . GERD (gastroesophageal reflux disease)   . Headache   . History of blood clot in brain    a. Fall age 79 - was told she had a blood clot in the brain from this.  . Hyperlipidemia   . Hypertension   . LGSIL (low grade squamous intraepithelial dysplasia) 10/05   PAP ON 02/2005, 7,8,9,10,11,12WNL  . Obesity   . Seizures ([place])    a. began after she had a fall at age 67, last seizure age 81.   Past Surgical History:  Procedure Laterality Date  . COLPOSCOPY    . ENDOMETRIAL ABLATION  02/2007   NOVASURE/MARSUPILIZATION OF BARTHOLIN  . MARSUPILIZATION OF BARTHOLIN  02/2007   AT TIME OF NOVASURE ABLATION SURGERY  . TUBAL LIGATION     Social History   Socioeconomic History  . Marital status: Single    Spouse name: Not on file  . Number of children: Not on file  . Years of education: Not on file  . Highest education level: Not on file  Occupational History  . Not on file  Tobacco Use  . Smoking status: Never Smoker  . Smokeless tobacco: Never Used  Substance and Sexual Activity   . Alcohol use: No    Alcohol/week: 0.0 standard drinks  . Drug use: No  . Sexual activity: Not Currently    Birth control/protection: Post-menopausal    Comment: 1st intercourse 59 yo-More than 5 partners  Other Topics Concern  . Not on file  Social History Narrative  . Not on file   Social Determinants of Health   Financial Resource Strain:   . Difficulty of Paying Living Expenses:   Food Insecurity:   . Worried About Charity fundraiser in the Last Year:   . Arboriculturist in the Last Year:   Transportation Needs:   . Film/video editor (Medical):   Marland Kitchen Lack of Transportation (Non-Medical):   Physical Activity:   . Days of Exercise per Week:   . Minutes of Exercise per Session:   Stress:   . Feeling of Stress :   Social Connections:   . Frequency of Communication with Friends and Family:   . Frequency of Social Gatherings with Friends and Family:   . Attends Religious Services:   . Active Member of Clubs or Organizations:   . Attends Archivist Meetings:   Marland Kitchen Marital Status:    Family History  Problem Relation Age of Onset  . Hypertension  Mother   . Hyperlipidemia Mother   . Heart disease Mother        was told she may have had a few MIs that she was unaware of  . Diabetes Father   . Hypertension Sister   . Diabetes Paternal Grandfather   . Colon cancer Neg Hx   . Esophageal cancer Neg Hx   . Rectal cancer Neg Hx   . Stomach cancer Neg Hx    Allergies  Allergen Reactions  . Codeine     COLD SWEAT, LIGHT-HEADEDNESS. Cold sweats   Prior to Admission medications   Medication Sig Start Date End Date Taking? Authorizing Provider  albuterol (PROAIR HFA) 108 (90 Base) MCG/ACT inhaler Inhale 1-2 puffs into the lungs every 6 (six) hours as needed for wheezing or shortness of breath. 10/16/18  Yes Nche, Charlene Brooke, NP  amLODipine (NORVASC) 5 MG tablet Take 1 tablet (5 mg total) by mouth at bedtime. 01/07/20  Yes Nche, Charlene Brooke, NP   amoxicillin-clavulanate (AUGMENTIN) 875-125 MG tablet Take 1 tablet by mouth 2 (two) times daily. 01/19/20  Yes Nche, Charlene Brooke, NP  Cetirizine HCl (ZYRTEC PO) Take 1 tablet by mouth daily.    Yes [provider]  Cholecalciferol (VITAMIN D PO) Take 1 tablet by mouth daily.    Yes [provider]  Diclofenac Sodium (PENNSAID) 2 % SOLN Place 1 inch onto the skin 2 (two) times daily. 06/25/19  Yes Nche, Charlene Brooke, NP  EPINEPHrine 0.3 mg/0.3 mL IJ SOAJ injection use as directed by prescriber 10/26/17  Yes [provider]  losartan (COZAAR) 25 MG tablet Take 1 tablet (25 mg total) by mouth daily. 01/07/20  Yes Nche, Charlene Brooke, NP  montelukast (SINGULAIR) 10 MG tablet Take 1 tablet (10 mg total) by mouth at bedtime. 01/07/20  Yes Nche, Charlene Brooke, NP  Multiple Vitamins-Calcium (ONE-A-DAY WOMENS PO) Take 1 tablet by mouth daily.    Yes [provider]  omeprazole (PRILOSEC) 20 MG capsule Take 1 capsule (20 mg total) by mouth daily. 01/07/20  Yes Nche, Charlene Brooke, NP  QVAR REDIHALER 40 MCG/ACT inhaler INHALE 1 PUFF BY MOUTH TWICE DAILY(RINSE MOUTH AFTER EACH USE) 06/19/19  Yes Nche, Charlene Brooke, NP     Positive ROS: Otherwise negative  All other systems have been reviewed and were otherwise negative with the exception of those mentioned in the HPI and as above.  Physical Exam: Constitutional: Alert, well-appearing, no acute distress Ears: External ears without lesions or tenderness. Ear canals are clear bilaterally with intact, clear TMs TMs bilaterally.  On auscultation of the ear I hear no objective pulsatile tinnitus. Nasal: External nose without lesions. Septum relatively midline with mild rhinitis..  After decongesting the nose the middle meatus region is clear bilaterally with no clinical evidence of active infection.  No mucopurulent discharge.  No polyps noted. Oral: Lips and gums without lesions. Tongue and palate mucosa without lesions.  Posterior oropharynx clear. Neck: No palpable adenopathy or masses Respiratory: Breathing comfortably  Skin: No facial/neck lesions or rash noted.  Procedures  Assessment: Chronic rhinitis Resolved sinusitis  Plan: Recommended continue use of the Zyrtec in Singulair. Also suggested use of Nasacort 2 sprays each nostril at night as this should help with the congestion in the nose and sinuses as well as the "popping" sound she hears in her head. She will follow-up if symptoms worsen.   Radene Journey, MD   CC:

## 2020-01-29 NOTE — Progress Notes (Signed)
KIMBERLEE SICKING [birth year] [id number]  SUBJECTIVE:  59 y.o. [id number] female for annual routine gynecologic exam. She has no gynecologic concerns.  Current Outpatient Medications  Medication Sig Dispense Refill  . albuterol (PROAIR HFA) 108 (90 Base) MCG/ACT inhaler Inhale 1-2 puffs into the lungs every 6 (six) hours as needed for wheezing or shortness of breath. 1 Inhaler 3  . amLODipine (NORVASC) 5 MG tablet Take 1 tablet (5 mg total) by mouth at bedtime. 90 tablet 3  . amoxicillin-clavulanate (AUGMENTIN) 875-125 MG tablet Take 1 tablet by mouth 2 (two) times daily. 20 tablet 0  . Cetirizine HCl (ZYRTEC PO) Take 1 tablet by mouth daily.     . Cholecalciferol (VITAMIN D PO) Take 1 tablet by mouth daily.     . Diclofenac Sodium (PENNSAID) 2 % SOLN Place 1 inch onto the skin 2 (two) times daily. 112 g 1  . EPINEPHrine 0.3 mg/0.3 mL IJ SOAJ injection use as directed by prescriber  0  . losartan (COZAAR) 25 MG tablet Take 1 tablet (25 mg total) by mouth daily. 90 tablet 3  . montelukast (SINGULAIR) 10 MG tablet Take 1 tablet (10 mg total) by mouth at bedtime. 90 tablet 3  . Multiple Vitamins-Calcium (ONE-A-DAY WOMENS PO) Take 1 tablet by mouth daily.     Marland Kitchen omeprazole (PRILOSEC) 20 MG capsule Take 1 capsule (20 mg total) by mouth daily. 90 capsule 3  . QVAR REDIHALER 40 MCG/ACT inhaler INHALE 1 PUFF BY MOUTH TWICE DAILY(RINSE MOUTH AFTER EACH USE) 10.6 g 1   No current facility-administered medications for this visit.   Allergies: Codeine  No LMP recorded. Patient has had an ablation.  Past medical history,surgical history, problem list, medications, allergies, family history and social history were all reviewed and documented as reviewed in the EPIC chart.  ROS:  Feeling well. No dyspnea or chest pain on exertion.  No abdominal pain, change in bowel habits, black or bloody stools.  No urinary tract symptoms. GYN ROS: no abnormal bleeding, pelvic pain or discharge, no breast pain or new or  enlarging lumps on self exam. No neurological complaints.   OBJECTIVE:  Ht 5' 5.5" (1.664 m)   Wt 230 lb (104.3 kg)   BMI 37.69 kg/m  The patient appears well, alert, oriented x 3, in no distress. ENT normal.  Neck supple. No cervical or supraclavicular adenopathy or thyromegaly.  Lungs are clear, good air entry, no wheezes, rhonchi or rales. S1 and S2 normal, no murmurs, regular rate and rhythm.  Abdomen soft without tenderness, guarding, mass or organomegaly.  Neurological is normal, no focal findings.  BREAST EXAM: breasts appear normal, no suspicious masses, no skin or nipple changes or axillary nodes  PELVIC EXAM: VULVA: normal appearing vulva with no masses, tenderness or lesions, VAGINA: normal appearing vagina with normal color and discharge, no lesions, no cystocele or rectocele, CERVIX: normal appearing cervix without discharge or lesions, prolapse of uterus/cervix grade 1-2, UTERUS: uterus is normal size, shape, consistency and nontender, ADNEXA: normal adnexa in size, nontender and no masses  Chaperone: [name] present during the examination  ASSESSMENT:  59 y.o. [id number] here for annual gynecologic exam  PLAN:   1. Postmenopausal. Prior endometrial ablation. Minimal hot flashes, no vaginal bleeding. 2. Pap smear/HPV 01/2019 normal.  History of LGSIL 2005 with ASCUS negative high risk HPV 2007.  Normal Pap smears since.  Next Pap smear due 2025 following the current guidelines recommending the 5 year interval. 3.  Uterine/cervical prolapse.  We  discussed this finding on exam today and she is asymptomatic at this time.  Recommended avoiding straining and repetitive heavy lifting, practice Kegel exercises on a regular basis. We discussed some of the symptoms that can present and she should let us know if any of that is happening. 4. Mammogram 08/2019.  Tiny nodule discovered on left breast at last mammogram, 40-month follow-up breast imaging recommended which she does have  scheduled.  Normal breast exam today.  Continue annual mammography. 5. Colonoscopy 2019.  Recommended that she follow up at the recommended interval.   6. DEXA never.  DEXA recommended this year for a baseline study, so she plans to schedule this. 7. Health maintenance.  No labs today as she normally has these completed with her primary care provider.   Return annually or sooner, prn.  Joseph Pierini MD, FACOG  01/29/20

## 2020-01-29 NOTE — Patient Instructions (Addendum)
We will plan to have a DEXA/bone density scan this year when you are able to screen for osteoporosis, please schedule as able. Mild uterine prolapse noted on the exam today. This is a fairly common finding, and will often will not cause any problems or symptoms. Let us know if any problems develop with feeling pelvic pressure, discomfort, or a 'bulge' sensation in the vaginal area.

## 2020-02-05 ENCOUNTER — Ambulatory Visit: Payer: PRIVATE HEALTH INSURANCE

## 2020-02-06 ENCOUNTER — Ambulatory Visit: Payer: PRIVATE HEALTH INSURANCE | Attending: Internal Medicine

## 2020-02-06 DIAGNOSIS — Z23 Encounter for immunization: Secondary | ICD-10-CM

## 2020-02-06 NOTE — Progress Notes (Signed)
   Covid-19 Vaccination Clinic  Name:  Claire Sutton    MRN: [id number] DOB: [birth year]  02/06/2020  [name] was observed post Covid-19 immunization for 15 minutes without incident. She was provided with Vaccine Information Sheet and instruction to access the V-Safe system.   [name] was instructed to call 911 with any severe reactions post vaccine: Marland Kitchen Difficulty breathing  . Swelling of face and throat  . A fast heartbeat  . A bad rash all over body  . Dizziness and weakness   Immunizations Administered    Name Date Dose VIS Date Route   Pfizer COVID-19 Vaccine 02/06/2020  8:14 AM 0.3 mL 10/17/2019 Intramuscular   Manufacturer: [employer], [employer]   Lot: [id number]   [place]: [id number]

## 2020-03-01 ENCOUNTER — Ambulatory Visit: Payer: PRIVATE HEALTH INSURANCE | Attending: Internal Medicine

## 2020-03-01 DIAGNOSIS — Z23 Encounter for immunization: Secondary | ICD-10-CM

## 2020-03-01 NOTE — Progress Notes (Signed)
   Covid-19 Vaccination Clinic  Name:  Claire Sutton    MRN: [id number] DOB: 02/21/1961  03/01/2020  [name] was observed post Covid-19 immunization for 15 minutes without incident. She was provided with Vaccine Information Sheet and instruction to access the V-Safe system.   [name] was instructed to call 911 with any severe reactions post vaccine: Marland Kitchen Difficulty breathing  . Swelling of face and throat  . A fast heartbeat  . A bad rash all over body  . Dizziness and weakness   Immunizations Administered    Name Date Dose VIS Date Route   Pfizer COVID-19 Vaccine 03/01/2020  3:58 PM 0.3 mL 12/31/2018 Intramuscular   Manufacturer: [place]   Lot: [id number]   [place]: [id number]

## 2020-03-10 ENCOUNTER — Other Ambulatory Visit: Payer: Self-pay

## 2020-03-11 ENCOUNTER — Ambulatory Visit (INDEPENDENT_AMBULATORY_CARE_PROVIDER_SITE_OTHER): Payer: PRIVATE HEALTH INSURANCE

## 2020-03-11 ENCOUNTER — Other Ambulatory Visit: Payer: Self-pay | Admitting: Obstetrics and Gynecology

## 2020-03-11 ENCOUNTER — Other Ambulatory Visit: Payer: Self-pay | Admitting: Obstetrics & Gynecology

## 2020-03-11 DIAGNOSIS — Z13820 Encounter for screening for osteoporosis: Secondary | ICD-10-CM | POA: Diagnosis not present

## 2020-03-11 DIAGNOSIS — Z01419 Encounter for gynecological examination (general) (routine) without abnormal findings: Secondary | ICD-10-CM

## 2020-03-11 DIAGNOSIS — Z780 Asymptomatic menopausal state: Secondary | ICD-10-CM

## 2020-07-09 ENCOUNTER — Telehealth: Payer: PRIVATE HEALTH INSURANCE | Admitting: Nurse Practitioner

## 2020-07-13 ENCOUNTER — Telehealth (INDEPENDENT_AMBULATORY_CARE_PROVIDER_SITE_OTHER): Payer: Self-pay | Admitting: Nurse Practitioner

## 2020-07-13 ENCOUNTER — Encounter: Payer: Self-pay | Admitting: Nurse Practitioner

## 2020-07-13 VITALS — BP 118/78 | HR 73 | Temp 97.6°F | Ht 66.0 in | Wt 220.0 lb

## 2020-07-13 DIAGNOSIS — J4520 Mild intermittent asthma, uncomplicated: Secondary | ICD-10-CM

## 2020-07-13 DIAGNOSIS — I10 Essential (primary) hypertension: Secondary | ICD-10-CM

## 2020-07-13 MED ORDER — ALBUTEROL SULFATE HFA 108 (90 BASE) MCG/ACT IN AERS: 1.0000 | INHALATION_SPRAY | Freq: Four times a day (QID) | RESPIRATORY_TRACT | 1 refills | Status: DC | PRN

## 2020-07-13 NOTE — Assessment & Plan Note (Signed)
Controlled Albuterol rx expired, needs renewal. Continued montelukast

## 2020-07-13 NOTE — Assessment & Plan Note (Signed)
BP at goal with losartan and amlodipine She will like to decrease pill burden by discontinuing amlodipine since BP has been controlled.  Ok to stop amlodipine Maintain losartan Continue to monitor BP 2-3x/week Call office if >140/80 F/up in 53months

## 2020-07-13 NOTE — Progress Notes (Signed)
Virtual Visit via Video Note  I connected with@ on 07/13/20 at  1:30 PM EDT by a video enabled telemedicine application and verified that I am speaking with the correct person using two identifiers.  Location: Patient:Home Provider: Office Participants: patient and provider  I discussed the limitations of evaluation and management by telemedicine and the availability of in person appointments. I also discussed with the patient that there may be a patient responsible charge related to this service. The patient expressed understanding and agreed to proceed.  CC:6 month f/u on HTN. Pt states BPs have been ranging between 118-130/76-80  History of Present Illness: Essential hypertension BP at goal with losartan and amlodipine She will like to decrease pill burden by discontinuing amlodipine since BP has been controlled.  Ok to stop amlodipine Maintain losartan Continue to monitor BP 2-3x/week Call office if >140/80 F/up in 68months  Asthma Controlled Albuterol rx expired, needs renewal. Continued montelukast  Wt Readings from Last 3 Encounters:  07/13/20 220 lb (99.8 kg)  01/29/20 230 lb (104.3 kg)  01/07/20 230 lb 6.4 oz (104.5 kg)   Observations/Objective: Physical Exam Cardiovascular:     Rate and Rhythm: Normal rate.     Pulses: Normal pulses.  Pulmonary:     Effort: Pulmonary effort is normal.  Musculoskeletal:     Right lower leg: No edema.     Left lower leg: No edema.  Neurological:     Mental Status: She is alert and oriented to person, place, and time.    Assessment and Plan: Janissa was seen today for follow-up.  Diagnoses and all orders for this visit:  Essential hypertension  Mild intermittent asthma without complication -     albuterol (PROAIR HFA) 108 (90 Base) MCG/ACT inhaler; Inhale 1-2 puffs into the lungs every 6 (six) hours as needed for wheezing or shortness of breath.   Follow Up Instructions: See HPI.   I discussed the assessment and  treatment plan with the patient. The patient was provided an opportunity to ask questions and all were answered. The patient agreed with the plan and demonstrated an understanding of the instructions.   The patient was advised to call back or seek an in-person evaluation if the symptoms worsen or if the condition fails to improve as anticipated.  Wilfred Lacy, NP

## 2020-07-13 NOTE — Assessment & Plan Note (Signed)
>>  ASSESSMENT AND PLAN FOR REACTIVE AIRWAY DISEASE WRITTEN ON 07/13/2020  1:50 PM BY Emillee Talsma LUM, NP  Controlled Albuterol rx expired, needs renewal. Continued montelukast

## 2020-08-24 ENCOUNTER — Telehealth: Payer: Self-pay | Admitting: Nurse Practitioner

## 2020-08-24 NOTE — Telephone Encounter (Signed)
Claire Sutton is calling and wanted to see if Claire Sutton received an order for a diagnosis ultra sound of the breast, please advise. CB is [phone]

## 2020-08-27 NOTE — Telephone Encounter (Signed)
Spoke with pt and she states that due to insurance changes she had exam rescheduled and she when scheduling they informed her that everything was approved and ready to go for the end of next month.

## 2020-10-13 LAB — HM MAMMOGRAPHY

## 2020-10-20 ENCOUNTER — Telehealth: Payer: Self-pay

## 2020-10-20 NOTE — Telephone Encounter (Signed)
Patient calling to request a medication to be filled.  Pennsaid.  Pt explained that she and Baldo Ash had discussed this during last visit, 07/13/20 VV. Please see message and advise.  CB# [phone]

## 2020-10-21 MED ORDER — PENNSAID 2 % EX SOLN: 1.0000 [in_us] | Freq: Two times a day (BID) | CUTANEOUS | 1 refills | Status: DC

## 2020-10-21 NOTE — Telephone Encounter (Signed)
Please advise on medication refill request that was d/c on last OV.  Thanks.  Dm/cma

## 2020-10-21 NOTE — Telephone Encounter (Signed)
Patient aware of message below and will pick up today

## 2020-11-18 ENCOUNTER — Other Ambulatory Visit: Payer: PRIVATE HEALTH INSURANCE

## 2020-11-18 DIAGNOSIS — Z20822 Contact with and (suspected) exposure to covid-19: Secondary | ICD-10-CM

## 2020-11-20 LAB — NOVEL CORONAVIRUS, NAA: SARS-CoV-2, NAA: NOT DETECTED

## 2020-11-20 LAB — SARS-COV-2, NAA 2 DAY TAT

## 2021-01-10 ENCOUNTER — Other Ambulatory Visit: Payer: Self-pay

## 2021-01-10 ENCOUNTER — Encounter: Payer: Self-pay | Admitting: Nurse Practitioner

## 2021-01-10 ENCOUNTER — Ambulatory Visit (INDEPENDENT_AMBULATORY_CARE_PROVIDER_SITE_OTHER): Payer: Managed Care, Other (non HMO) | Admitting: Nurse Practitioner

## 2021-01-10 VITALS — BP 150/90 | HR 60 | Temp 98.3°F | Ht 65.5 in | Wt 210.4 lb

## 2021-01-10 DIAGNOSIS — Z0001 Encounter for general adult medical examination with abnormal findings: Secondary | ICD-10-CM

## 2021-01-10 DIAGNOSIS — Z1231 Encounter for screening mammogram for malignant neoplasm of breast: Secondary | ICD-10-CM

## 2021-01-10 DIAGNOSIS — J4520 Mild intermittent asthma, uncomplicated: Secondary | ICD-10-CM | POA: Diagnosis not present

## 2021-01-10 DIAGNOSIS — E785 Hyperlipidemia, unspecified: Secondary | ICD-10-CM

## 2021-01-10 DIAGNOSIS — E669 Obesity, unspecified: Secondary | ICD-10-CM | POA: Diagnosis not present

## 2021-01-10 DIAGNOSIS — I10 Essential (primary) hypertension: Secondary | ICD-10-CM

## 2021-01-10 DIAGNOSIS — K219 Gastro-esophageal reflux disease without esophagitis: Secondary | ICD-10-CM

## 2021-01-10 DIAGNOSIS — Z6834 Body mass index (BMI) 34.0-34.9, adult: Secondary | ICD-10-CM

## 2021-01-10 LAB — CBC
HCT: 42.1 % (ref 36.0–46.0)
Hemoglobin: 14.1 g/dL (ref 12.0–15.0)
MCHC: 33.5 g/dL (ref 30.0–36.0)
MCV: 85.5 fl (ref 78.0–100.0)
Platelets: 214 10*3/uL (ref 150.0–400.0)
RBC: 4.93 Mil/uL (ref 3.87–5.11)
RDW: 14.5 % (ref 11.5–15.5)
WBC: 3.6 10*3/uL — ABNORMAL LOW (ref 4.0–10.5)

## 2021-01-10 LAB — LIPID PANEL
Cholesterol: 221 mg/dL — ABNORMAL HIGH (ref 0–200)
HDL: 60.5 mg/dL (ref >39.00)
LDL Cholesterol: 147 mg/dL — ABNORMAL HIGH (ref 0–99)
NonHDL: 160.6
Total CHOL/HDL Ratio: 4
Triglycerides: 67 mg/dL (ref 0.0–149.0)
VLDL: 13.4 mg/dL (ref 0.0–40.0)

## 2021-01-10 LAB — COMPREHENSIVE METABOLIC PANEL
ALT: 19 U/L (ref 0–35)
AST: 19 U/L (ref 0–37)
Albumin: 3.9 g/dL (ref 3.5–5.2)
Alkaline Phosphatase: 76 U/L (ref 39–117)
BUN: 15 mg/dL (ref 6–23)
CO2: 29 mEq/L (ref 19–32)
Calcium: 9.6 mg/dL (ref 8.4–10.5)
Chloride: 106 mEq/L (ref 96–112)
Creatinine, Ser: 0.88 mg/dL (ref 0.40–1.20)
GFR: 71.16 mL/min (ref >60.00)
Glucose, Bld: 83 mg/dL (ref 70–99)
Potassium: 3.9 mEq/L (ref 3.5–5.1)
Sodium: 141 mEq/L (ref 135–145)
Total Bilirubin: 0.8 mg/dL (ref 0.2–1.2)
Total Protein: 7 g/dL (ref 6.0–8.3)

## 2021-01-10 LAB — TSH: TSH: 1.41 u[IU]/mL (ref 0.35–4.50)

## 2021-01-10 MED ORDER — OMEPRAZOLE 20 MG PO CPDR
20.0000 mg | DELAYED_RELEASE_CAPSULE | Freq: Every day | ORAL | 3 refills | Status: DC
Start: 2021-01-10 — End: 2022-03-08

## 2021-01-10 MED ORDER — MONTELUKAST SODIUM 10 MG PO TABS: 10.0000 mg | ORAL_TABLET | Freq: Every day | ORAL | 3 refills | Status: DC

## 2021-01-10 MED ORDER — LOSARTAN POTASSIUM 25 MG PO TABS: 25.0000 mg | ORAL_TABLET | Freq: Every day | ORAL | 3 refills | Status: DC

## 2021-01-10 NOTE — Progress Notes (Signed)
Subjective:    Patient ID: Claire Sutton, female    DOB: Jun 25, 1961, 60 y.o.   MRN: [id number]  Patient presents today for CPE and eval of chronic conditions  HPI Essential hypertension BP not at goal Repeat BP: L. Arm 180/110, R. Arm 178/108. Reports increased stress at home due to custody conflict with grand daughter. Compliant with losartan and DASH diet. BP Readings from Last 3 Encounters:  01/10/21 (!) 150/90  07/13/20 118/78  01/29/20 124/82   Increase losartan to 50mg  if BP >150/90. Repeat TSH, CMP F/up in 67months  Sexual History (orientation,birth control, marital status, STD):not sexually active, no need for STD screen, will schedule upcoming mammogram  Depression/Suicide: Depression screen [facility] 2/9 01/10/2021 01/07/2020 12/18/2018 06/13/2018 08/15/2017 09/07/2016  Decreased Interest 0 0 0 0 0 0  Down, Depressed, Hopeless 0 0 0 0 0 0  PHQ - 2 Score 0 0 0 0 0 0  Altered sleeping 1 - - - - -  Tired, decreased energy 0 - - - - -  Change in appetite 1 - - - - -  Feeling bad or failure about yourself  0 - - - - -  Trouble concentrating 0 - - - - -  Moving slowly or fidgety/restless 0 - - - - -  Suicidal thoughts 0 - - - - -  PHQ-9 Score 2 - - - - -  Difficult doing work/chores Not difficult at all - - - - -   Vision:up to date  Dental:up to date  Immunizations: (TDAP, Hep C screen, Pneumovax, Influenza, zoster)  Health Maintenance  Topic Date Due  . Flu Shot  Never done  . Mammogram  10/13/2021  . Pap Smear  01/26/2022  . Colon Cancer Screening  09/24/2023  . Tetanus Vaccine  04/19/2027  . COVID-19 Vaccine  Completed  .  Hepatitis C: One time screening is recommended by Center for Disease Control  (CDC) for  adults born from [birth year] through 1966.   Completed  . HIV Screening  Completed  . HPV Vaccine  Aged Out   Diet:DASH diet Exercise: walking Weight:  Wt Readings from Last 3 Encounters:  01/10/21 210 lb 6.4 oz (95.4 kg)  07/13/20 220 lb (99.8 kg)  01/29/20  230 lb (104.3 kg)   Fall Risk: Fall Risk  01/10/2021 01/07/2020 01/07/2020 12/18/2018 06/13/2018 08/15/2017 09/07/2016  Falls in the past year? 1 1 1  0 No Yes No  Number falls in past yr: 0 0 0 - - 1 -  Injury with Fall? 0 0 0 - - No -  Risk for fall due to : History of fall(s) - - - - - -  Follow up Falls evaluation completed - - - - - -   Advanced Directive: Advanced Directives 12/18/2018  Does Patient Have a Medical Advance Directive? No  Would patient like information on creating a medical advance directive? Yes (MAU/Ambulatory/Procedural Areas - Information given)    Medications and allergies reviewed with patient and updated if appropriate.  Patient Active Problem List   Diagnosis Date Noted  . Left breast mass 01/09/2020  . Postmenopausal estrogen deficiency 01/22/2019  . History of colon polyps 06/13/2018  . Chronic right hip pain 03/07/2018  . Asthma 03/14/2017  . Shortness of breath on exertion 10/18/2016  . Abnormal EKG 10/18/2016  . Dyslipidemia 10/18/2016  . Essential hypertension 09/15/2016  . Obesity   . Gastroesophageal reflux disease without esophagitis 09/07/2016  . Acute bronchitis 09/07/2016  . Intermediate uveitis of  left eye 06/16/2014  . Scleritis and episcleritis of left eye 06/16/2014    Current Outpatient Medications on File Prior to Visit  Medication Sig Dispense Refill  . albuterol (PROAIR HFA) 108 (90 Base) MCG/ACT inhaler Inhale 1-2 puffs into the lungs every 6 (six) hours as needed for wheezing or shortness of breath. 6.7 g 1  . Cetirizine HCl (ZYRTEC PO) Take 1 tablet by mouth daily.     . Cholecalciferol (VITAMIN D PO) Take 1 tablet by mouth daily.     . Diclofenac Sodium (PENNSAID) 2 % SOLN Apply 1 inch topically 2 (two) times daily. 112 g 1  . EPINEPHrine 0.3 mg/0.3 mL IJ SOAJ injection use as directed by prescriber  0  . Multiple Vitamins-Calcium (ONE-A-DAY WOMENS PO) Take 1 tablet by mouth daily.      No current facility-administered medications  on file prior to visit.    Past Medical History:  Diagnosis Date  . Allergy   . ASCUS (atypical squamous cells of undetermined significance) on Pap smear 07/2006   NEG HIGH RISK HPV-- THEN IN 10/2006 AND 07/2007 PAPS WITH BENIGN REACTIVE CHANGES  . Asthma   . GERD (gastroesophageal reflux disease)   . Headache   . History of blood clot in brain    a. Fall age 28 - was told she had a blood clot in the brain from this.  . Hyperlipidemia   . Hypertension   . LGSIL (low grade squamous intraepithelial dysplasia) 10/05   PAP ON 02/2005, 7,8,9,10,11,12WNL  . Obesity   . Seizures ([place])    a. began after she had a fall at age 63, last seizure age 33.    Past Surgical History:  Procedure Laterality Date  . COLPOSCOPY    . ENDOMETRIAL ABLATION  02/2007   NOVASURE/MARSUPILIZATION OF BARTHOLIN  . MARSUPILIZATION OF BARTHOLIN  02/2007   AT TIME OF NOVASURE ABLATION SURGERY  . TUBAL LIGATION     Social History   Socioeconomic History  . Marital status: Single    Spouse name: Not on file  . Number of children: Not on file  . Years of education: Not on file  . Highest education level: Not on file  Occupational History  . Not on file  Tobacco Use  . Smoking status: Never Smoker  . Smokeless tobacco: Never Used  Vaping Use  . Vaping Use: Never used  Substance and Sexual Activity  . Alcohol use: No    Alcohol/week: 0.0 standard drinks  . Drug use: No  . Sexual activity: Not Currently    Birth control/protection: Post-menopausal    Comment: 1st intercourse 60 yo-More than 5 partners  Other Topics Concern  . Not on file  Social History Narrative  . Not on file   Social Determinants of Health   Financial Resource Strain: Not on file  Food Insecurity: Not on file  Transportation Needs: Not on file  Physical Activity: Not on file  Stress: Not on file  Social Connections: Not on file   Family History  Problem Relation Age of Onset  . Hypertension Mother   . Hyperlipidemia  Mother   . Heart disease Mother        was told she may have had a few MIs that she was unaware of  . Diabetes Father   . Hypertension Sister   . Diabetes Paternal Grandfather   . Colon cancer Neg Hx   . Esophageal cancer Neg Hx   . Rectal cancer Neg Hx   .  Stomach cancer Neg Hx        Review of Systems  Constitutional: Negative for fever, malaise/fatigue and weight loss.  HENT: Negative for congestion and sore throat.   Eyes:       Negative for visual changes  Respiratory: Negative for cough and shortness of breath.   Cardiovascular: Negative for chest pain, palpitations and leg swelling.  Gastrointestinal: Negative for blood in stool, constipation, diarrhea and heartburn.  Genitourinary: Negative for dysuria, frequency and urgency.  Musculoskeletal: Negative for falls, joint pain and myalgias.  Skin: Negative for rash.  Neurological: Negative for dizziness, sensory change and headaches.  Endo/Heme/Allergies: Does not bruise/bleed easily.  Psychiatric/Behavioral: Negative for depression, substance abuse and suicidal ideas. The patient is not nervous/anxious.    Objective:   Vitals:   01/10/21 1023  BP: (!) 150/90  Pulse: 60  Temp: 98.3 F (36.8 C)  SpO2: 98%   Body mass index is 34.48 kg/m.  Physical Examination:  Physical Exam Vitals reviewed.  Constitutional:      General: She is not in acute distress.    Appearance: She is obese.  HENT:     Right Ear: Tympanic membrane, ear canal and external ear normal.     Left Ear: Tympanic membrane, ear canal and external ear normal.     Mouth/Throat:     Mouth: Oropharynx is clear and moist.  Eyes:     General: No scleral icterus.    Extraocular Movements: EOM normal.  Neck:     Thyroid: No thyromegaly.  Cardiovascular:     Rate and Rhythm: Normal rate and regular rhythm.     Pulses: Normal pulses and intact distal pulses.     Heart sounds: Normal heart sounds.  Pulmonary:     Effort: Pulmonary effort is normal.      Breath sounds: Normal breath sounds.  Chest:     Chest wall: No tenderness.  Abdominal:     General: Bowel sounds are normal. There is no distension.     Palpations: Abdomen is soft.     Tenderness: There is no abdominal tenderness.  Genitourinary:    Comments: declined Musculoskeletal:        General: No tenderness or edema. Normal range of motion.     Cervical back: Normal range of motion and neck supple.  Lymphadenopathy:     Cervical: No cervical adenopathy.  Skin:    General: Skin is warm and dry.  Neurological:     Mental Status: She is alert and oriented to person, place, and time.  Psychiatric:        Mood and Affect: Mood normal.        Behavior: Behavior normal.        Thought Content: Thought content normal.    ASSESSMENT and PLAN: This visit occurred during the SARS-CoV-2 public health emergency.  Safety protocols were in place, including screening questions prior to the visit, additional usage of staff PPE, and extensive cleaning of exam room while observing appropriate contact time as indicated for disinfecting solutions.   Kirra was seen today for annual exam.  Diagnoses and all orders for this visit:  Encounter for preventative adult health care exam with abnormal findings -     CBC -     Comprehensive metabolic panel -     TSH  Dyslipidemia -     Lipid panel  Essential hypertension -     losartan (COZAAR) 25 MG tablet; Take 1-2 tablets (25-50 mg total) by mouth daily.  Take 2tabs if BP >150/90  Mild intermittent asthma without complication -     montelukast (SINGULAIR) 10 MG tablet; Take 1 tablet (10 mg total) by mouth at bedtime.  Class 1 obesity with serious comorbidity and body mass index (BMI) of 34.0 to 34.9 in adult, unspecified obesity type  Breast cancer screening by mammogram -     MM 3D SCREEN BREAST BILATERAL; Future  Gastroesophageal reflux disease without esophagitis -     omeprazole (PRILOSEC) 20 MG capsule; Take 1 capsule (20 mg  total) by mouth daily.      Problem List Items Addressed This Visit      Cardiovascular and Mediastinum   Essential hypertension    BP not at goal Repeat BP: L. Arm 180/110, R. Arm 178/108. Reports increased stress at home due to custody conflict with grand daughter. Compliant with losartan and DASH diet. BP Readings from Last 3 Encounters:  01/10/21 (!) 150/90  07/13/20 118/78  01/29/20 124/82   Increase losartan to 50mg  if BP >150/90. Repeat TSH, CMP F/up in 9months      Relevant Medications   losartan (COZAAR) 25 MG tablet     Respiratory   Asthma   Relevant Medications   montelukast (SINGULAIR) 10 MG tablet     Digestive   Gastroesophageal reflux disease without esophagitis   Relevant Medications   omeprazole (PRILOSEC) 20 MG capsule     Other   Dyslipidemia   Relevant Orders   Lipid panel (Completed)   Obesity    Other Visit Diagnoses    Encounter for preventative adult health care exam with abnormal findings    -  Primary   Relevant Orders   CBC (Completed)   Comprehensive metabolic panel (Completed)   TSH (Completed)   Breast cancer screening by mammogram       Relevant Orders   MM 3D SCREEN BREAST BILATERAL       Follow up: Return in about 3 months (around 04/12/2021) for HTN.  Wilfred Lacy, NP

## 2021-01-10 NOTE — Assessment & Plan Note (Signed)
BP not at goal Repeat BP: L. Arm 180/110, R. Arm 178/108. Reports increased stress at home due to custody conflict with grand daughter. Compliant with losartan and DASH diet. BP Readings from Last 3 Encounters:  01/10/21 (!) 150/90  07/13/20 118/78  01/29/20 124/82   Increase losartan to 50mg  if BP >150/90. Repeat TSH, CMP F/up in 46months

## 2021-01-10 NOTE — Patient Instructions (Signed)
Go to lab for blood draw  Schedule mammogram in December.  Call office if BP persistently >150/90 with losartan 36m.  Preventive Care [id number] Old, Female Preventive care refers to lifestyle choices and visits with your health care provider that can promote health and wellness. This includes:  A yearly physical exam. This is also called an annual wellness visit.  Regular dental and eye exams.  Immunizations.  Screening for certain conditions.  Healthy lifestyle choices, such as: ? Eating a healthy diet. ? Getting regular exercise. ? Not using drugs or products that contain nicotine and tobacco. ? Limiting alcohol use. What can I expect for my preventive care visit? Physical exam Your health care provider will check your:  Height and weight. These may be used to calculate your BMI (body mass index). BMI is a measurement that tells if you are at a healthy weight.  Heart rate and blood pressure.  Body temperature.  Skin for abnormal spots. Counseling Your health care provider may ask you questions about your:  Past medical problems.  Family's medical history.  Alcohol, tobacco, and drug use.  Emotional well-being.  Home life and relationship well-being.  Sexual activity.  Diet, exercise, and sleep habits.  Work and work eStatistician  Access to firearms.  Method of birth control.  Menstrual cycle.  Pregnancy history. What immunizations do I need? Vaccines are usually given at various ages, according to a schedule. Your health care provider will recommend vaccines for you based on your age, medical history, and lifestyle or other factors, such as travel or where you work.   What tests do I need? Blood tests  Lipid and cholesterol levels. These may be checked every 5 years, or more often if you are over 61years old.  Hepatitis C test.  Hepatitis B test. Screening  Lung cancer screening. You may have this screening every year starting at age 61if  you have a 30-pack-year history of smoking and currently smoke or have quit within the past 15 years.  Colorectal cancer screening. ? All adults should have this screening starting at age 61and continuing until age 61 ? Your health care provider may recommend screening at age 5671if you are at increased risk. ? You will have tests every 1-10 years, depending on your results and the type of screening test.  Diabetes screening. ? This is done by checking your blood sugar (glucose) after you have not eaten for a while (fasting). ? You may have this done every 1-3 years.  Mammogram. ? This may be done every 1-2 years. ? Talk with your health care provider about when you should start having regular mammograms. This may depend on whether you have a family history of breast cancer.  BRCA-related cancer screening. This may be done if you have a family history of breast, ovarian, tubal, or peritoneal cancers.  Pelvic exam and Pap test. ? This may be done every 3 years starting at age 61 ? Starting at age 61 this may be done every 5 years if you have a Pap test in combination with an HPV test. Other tests  STD (sexually transmitted disease) testing, if you are at risk.  Bone density scan. This is done to screen for osteoporosis. You may have this scan if you are at high risk for osteoporosis. Talk with your health care provider about your test results, treatment options, and if necessary, the need for more tests. Follow these instructions at home: Eating and drinking  Eat  a diet that includes fresh fruits and vegetables, whole grains, lean protein, and low-fat dairy products.  Take vitamin and mineral supplements as recommended by your health care provider.  Do not drink alcohol if: ? Your health care provider tells you not to drink. ? You are pregnant, may be pregnant, or are planning to become pregnant.  If you drink alcohol: ? Limit how much you have to 0-1 drink a day. ? Be aware  of how much alcohol is in your drink. In the [place] one drink equals one 12 oz bottle of beer (355 mL), one 5 oz glass of wine (148 mL), or one 1 oz glass of hard liquor (44 mL).   Lifestyle  Take daily care of your teeth and gums. Brush your teeth every morning and night with fluoride toothpaste. Floss one time each day.  Stay active. Exercise for at least 30 minutes 5 or more days each week.  Do not use any products that contain nicotine or tobacco, such as cigarettes, e-cigarettes, and chewing tobacco. If you need help quitting, ask your health care provider.  Do not use drugs.  If you are sexually active, practice safe sex. Use a condom or other form of protection to prevent STIs (sexually transmitted infections).  If you do not wish to become pregnant, use a form of birth control. If you plan to become pregnant, see your health care provider for a prepregnancy visit.  If told by your health care provider, take low-dose aspirin daily starting at age 61.  Find healthy ways to cope with stress, such as: ? Meditation, yoga, or listening to music. ? Journaling. ? Talking to a trusted person. ? Spending time with friends and family. Safety  Always wear your seat belt while driving or riding in a vehicle.  Do not drive: ? If you have been drinking alcohol. Do not ride with someone who has been drinking. ? When you are tired or distracted. ? While texting.  Wear a helmet and other protective equipment during sports activities.  If you have firearms in your house, make sure you follow all gun safety procedures. What's next?  Visit your health care provider once a year for an annual wellness visit.  Ask your health care provider how often you should have your eyes and teeth checked.  Stay up to date on all vaccines. This information is not intended to replace advice given to you by your health care provider. Make sure you discuss any questions you have with your health care  provider. Document Revised: 07/27/2020 Document Reviewed: 07/04/2018 Elsevier Patient Education  2021 [employer].

## 2021-01-13 ENCOUNTER — Encounter: Payer: Self-pay | Admitting: Nurse Practitioner

## 2021-01-13 ENCOUNTER — Telehealth: Payer: Self-pay | Admitting: Nurse Practitioner

## 2021-01-13 DIAGNOSIS — E785 Hyperlipidemia, unspecified: Secondary | ICD-10-CM

## 2021-01-13 MED ORDER — ATORVASTATIN CALCIUM 20 MG PO TABS: 20.0000 mg | ORAL_TABLET | Freq: Every day | ORAL | 3 refills | Status: DC

## 2021-01-13 NOTE — Assessment & Plan Note (Signed)
ASCVD risk of 14% over the next 57yrs Start atorvastatin 20mg  Lipid Panel     Component Value Date/Time   CHOL 221 (H) 01/10/2021 1137   CHOL 229 (H) 06/13/2018 0904   TRIG 67.0 01/10/2021 1137   HDL 60.50 01/10/2021 1137   HDL 51 06/13/2018 0904   CHOLHDL 4 01/10/2021 1137   VLDL 13.4 01/10/2021 1137   LDLCALC 147 (H) 01/10/2021 1137   LDLCALC 164 (H) 06/13/2018 0904   LABVLDL 14 06/13/2018 0904

## 2021-01-13 NOTE — Telephone Encounter (Signed)
Patient okay to start Atorvastatin

## 2021-01-13 NOTE — Telephone Encounter (Signed)
Patient is calling back to let her provider know that it's okay to go ahead and call in that cholesterol medication. Please call her back if you have any questions.

## 2021-01-13 NOTE — Addendum Note (Signed)
Addended by: Leana Gamer on: 01/13/2021 04:37 PM   Modules accepted: Orders

## 2021-02-02 ENCOUNTER — Other Ambulatory Visit: Payer: Self-pay

## 2021-02-02 ENCOUNTER — Encounter: Payer: PRIVATE HEALTH INSURANCE | Admitting: Obstetrics and Gynecology

## 2021-02-02 ENCOUNTER — Ambulatory Visit (INDEPENDENT_AMBULATORY_CARE_PROVIDER_SITE_OTHER): Payer: Managed Care, Other (non HMO) | Admitting: Obstetrics & Gynecology

## 2021-02-02 ENCOUNTER — Encounter: Payer: Self-pay | Admitting: Obstetrics & Gynecology

## 2021-02-02 VITALS — BP 140/80 | Ht 65.25 in | Wt 220.0 lb

## 2021-02-02 DIAGNOSIS — Z01419 Encounter for gynecological examination (general) (routine) without abnormal findings: Secondary | ICD-10-CM | POA: Diagnosis not present

## 2021-02-02 DIAGNOSIS — Z780 Asymptomatic menopausal state: Secondary | ICD-10-CM | POA: Diagnosis not present

## 2021-02-02 DIAGNOSIS — Z6836 Body mass index (BMI) 36.0-36.9, adult: Secondary | ICD-10-CM | POA: Diagnosis not present

## 2021-02-02 NOTE — Progress Notes (Signed)
Claire Sutton 03/25/1961 [id number]   History:    60 y.o. G3P2A1L2 Single  RP:  Established patient presenting for annual gyn exam   HPI: Postmenopausal, well on no HRT. No PMB.  H/O endometrial ablation. Minimal hot flashes.  No pelvic pain.  Abstinent. Pap smear/HPV 01/2019 normal.  Breasts normal.  Urine/BMs normal.  BMI 36.33.  Health labs with Fam MD.  Treated for hypercholesterolemia.  Colonoscopy 2019.    Past medical history,surgical history, family history and social history were all reviewed and documented in the EPIC chart.  Gynecologic History No LMP recorded. Patient has had an ablation.  Obstetric History OB History  Gravida Para Term Preterm AB Living  3 2     1 2   SAB IAB Ectopic Multiple Live Births    1          # Outcome Date GA Lbr Len/2nd Weight Sex Delivery Anes PTL Lv  3 IAB           2 Para           1 Para              ROS: A ROS was performed and pertinent positives and negatives are included in the history.  GENERAL: No fevers or chills. HEENT: No change in vision, no earache, sore throat or sinus congestion. NECK: No pain or stiffness. CARDIOVASCULAR: No chest pain or pressure. No palpitations. PULMONARY: No shortness of breath, cough or wheeze. GASTROINTESTINAL: No abdominal pain, nausea, vomiting or diarrhea, melena or bright red blood per rectum. GENITOURINARY: No urinary frequency, urgency, hesitancy or dysuria. MUSCULOSKELETAL: No joint or muscle pain, no back pain, no recent trauma. DERMATOLOGIC: No rash, no itching, no lesions. ENDOCRINE: No polyuria, polydipsia, no heat or cold intolerance. No recent change in weight. HEMATOLOGICAL: No anemia or easy bruising or bleeding. NEUROLOGIC: No headache, seizures, numbness, tingling or weakness. PSYCHIATRIC: No depression, no loss of interest in normal activity or change in sleep pattern.     Exam:   BP 140/80   Ht 5' 5.25" (1.657 m)   Wt 220 lb (99.8 kg)   BMI 36.33 kg/m   Body mass index is  36.33 kg/m.  General appearance : Well developed well nourished female. No acute distress HEENT: Eyes: no retinal hemorrhage or exudates,  Neck supple, trachea midline, no carotid bruits, no thyroidmegaly Lungs: Clear to auscultation, no rhonchi or wheezes, or rib retractions  Heart: Regular rate and rhythm, no murmurs or gallops Breast:Examined in sitting and supine position were symmetrical in appearance, no palpable masses or tenderness,  no skin retraction, no nipple inversion, no nipple discharge, no skin discoloration, no axillary or supraclavicular lymphadenopathy Abdomen: no palpable masses or tenderness, no rebound or guarding Extremities: no edema or skin discoloration or tenderness  Pelvic: Vulva: Normal             Vagina: No gross lesions or discharge  Cervix: No gross lesions or discharge  Uterus  AV, normal size, shape and consistency, non-tender and mobile  Adnexa  Without masses or tenderness  Anus: Normal   Assessment/Plan:  60 y.o. female for annual exam   1. Well female exam with routine gynecological exam Normal gynecologic exam and menopause.  No indication for Pap test this year.  Breast exam normal.  Screening mammogram December 2021 was negative.  Colonoscopy 2019.  Health labs with family NP.  2. Postmenopause Well on no hormone replacement therapy.  No postmenopausal bleeding.  Vitamin D  supplements, calcium intake of 1.5 g/day total and regular weightbearing physical activity is recommended.  3. Class 2 severe obesity due to excess calories with serious comorbidity and body mass index (BMI) of 36.0 to 36.9 in adult [facility]) Recommend a lower calorie/carb diet.  Aerobic activities 5 times a week and light weightlifting every 2 days.  Princess Bruins MD, 12:16 PM 02/02/2021

## 2021-02-04 ENCOUNTER — Encounter: Payer: Self-pay | Admitting: Obstetrics & Gynecology

## 2021-04-12 ENCOUNTER — Other Ambulatory Visit: Payer: Self-pay

## 2021-04-12 ENCOUNTER — Encounter: Payer: Self-pay | Admitting: Nurse Practitioner

## 2021-04-12 ENCOUNTER — Ambulatory Visit (INDEPENDENT_AMBULATORY_CARE_PROVIDER_SITE_OTHER): Payer: PRIVATE HEALTH INSURANCE | Admitting: Nurse Practitioner

## 2021-04-12 VITALS — BP 132/76 | HR 68 | Temp 97.8°F | Ht 65.25 in | Wt 222.4 lb

## 2021-04-12 DIAGNOSIS — J320 Chronic maxillary sinusitis: Secondary | ICD-10-CM

## 2021-04-12 DIAGNOSIS — G8929 Other chronic pain: Secondary | ICD-10-CM

## 2021-04-12 DIAGNOSIS — M25551 Pain in right hip: Secondary | ICD-10-CM | POA: Diagnosis not present

## 2021-04-12 DIAGNOSIS — I10 Essential (primary) hypertension: Secondary | ICD-10-CM

## 2021-04-12 MED ORDER — AZITHROMYCIN 250 MG PO TABS: 250.0000 mg | ORAL_TABLET | Freq: Every day | ORAL | 0 refills | Status: DC

## 2021-04-12 MED ORDER — MOMETASONE FUROATE 50 MCG/ACT NA SUSP: 2.0000 | Freq: Every day | NASAL | 12 refills | Status: DC

## 2021-04-12 NOTE — Patient Instructions (Signed)
Change flonase to nasonex Start azithromycin if no improvement in 1week with nasonex  Maintain other medications

## 2021-04-12 NOTE — Assessment & Plan Note (Addendum)
Improved with home exercises

## 2021-04-12 NOTE — Progress Notes (Signed)
Subjective:  Patient ID: Claire Sutton, female    DOB: [birth year]  Age: 60 y.o. MRN: [id number]  CC: Follow-up (3 month follow up (not fasting))  Sinusitis This is a recurrent problem. The current episode started 1 to 4 weeks ago. The problem has been waxing and waning since onset. There has been no fever. Associated symptoms include congestion, headaches, sinus pressure, sneezing, a sore throat and swollen glands. Pertinent negatives include no chills, coughing, diaphoresis, ear pain, hoarse voice, neck pain or shortness of breath. (Teeth sensitivity) Past treatments include saline sprays (montelukast, zyrtec and flonase).   Chronic right hip pain Improved with home exercises  Essential hypertension Improved with decreased stress at home. Took 50mg  losartan x 1week, then decreased to 25mg  Home BP reading: 110s/80s BP Readings from Last 3 Encounters:  04/12/21 132/76  02/02/21 140/80  01/10/21 (!) 150/90   Maintain current medication dose  Reviewed past Medical, Social and Family history today.  Outpatient Medications Prior to Visit  Medication Sig Dispense Refill  . albuterol (PROAIR HFA) 108 (90 Base) MCG/ACT inhaler Inhale 1-2 puffs into the lungs every 6 (six) hours as needed for wheezing or shortness of breath. 6.7 g 1  . atorvastatin (LIPITOR) 20 MG tablet Take 1 tablet (20 mg total) by mouth daily. 90 tablet 3  . Cetirizine HCl (ZYRTEC PO) Take 1 tablet by mouth daily.     . Cholecalciferol (VITAMIN D PO) Take 1 tablet by mouth daily.     . Coenzyme Q10 (CO Q 10 PO) Take by mouth.    . Cyanocobalamin (B-12) 1000 MCG CAPS Take by mouth.    . Diclofenac Sodium (PENNSAID) 2 % SOLN Apply 1 inch topically 2 (two) times daily. 112 g 1  . EPINEPHrine 0.3 mg/0.3 mL IJ SOAJ injection use as directed by prescriber  0  . losartan (COZAAR) 25 MG tablet Take 1-2 tablets (25-50 mg total) by mouth daily. Take 2tabs if BP >150/90 180 tablet 3  . montelukast (SINGULAIR) 10 MG tablet  Take 1 tablet (10 mg total) by mouth at bedtime. 90 tablet 3  . Multiple Vitamins-Calcium (ONE-A-DAY WOMENS PO) Take 1 tablet by mouth daily.     Marland Kitchen omeprazole (PRILOSEC) 20 MG capsule Take 1 capsule (20 mg total) by mouth daily. 90 capsule 3  . Zinc 50 MG CAPS Take by mouth.     No facility-administered medications prior to visit.    ROS See HPI  Objective:  BP 132/76   Pulse 68   Temp 97.8 F (36.6 C)   Ht 5' 5.25" (1.657 m)   Wt 222 lb 6.4 oz (100.9 kg)   SpO2 98%   BMI 36.73 kg/m   Physical Exam Cardiovascular:     Rate and Rhythm: Normal rate.     Pulses: Normal pulses.  Pulmonary:     Effort: Pulmonary effort is normal.  Musculoskeletal:     Right lower leg: No edema.     Left lower leg: No edema.  Neurological:     Mental Status: She is alert and oriented to person, place, and time.  Psychiatric:        Mood and Affect: Mood normal.        Behavior: Behavior normal.        Thought Content: Thought content normal.    Assessment & Plan:  This visit occurred during the SARS-CoV-2 public health emergency.  Safety protocols were in place, including screening questions prior to the visit, additional usage of staff  PPE, and extensive cleaning of exam room while observing appropriate contact time as indicated for disinfecting solutions.   Claire Sutton was seen today for follow-up.  Diagnoses and all orders for this visit:  Essential hypertension  Chronic maxillary sinusitis -     mometasone (NASONEX) 50 MCG/ACT nasal spray; Place 2 sprays into the nose daily. -     azithromycin (ZITHROMAX Z-PAK) 250 MG tablet; Take 1 tablet (250 mg total) by mouth daily. Take 2tabs on first day, then 1tab once a day till complete  Chronic right hip pain   Problem List Items Addressed This Visit      Cardiovascular and Mediastinum   Essential hypertension - Primary    Improved with decreased stress at home. Took 50mg  losartan x 1week, then decreased to 25mg  Home BP reading:  110s/80s BP Readings from Last 3 Encounters:  04/12/21 132/76  02/02/21 140/80  01/10/21 (!) 150/90   Maintain current medication dose        Other   Chronic right hip pain    Improved with home exercises       Other Visit Diagnoses    Chronic maxillary sinusitis       Relevant Medications   mometasone (NASONEX) 50 MCG/ACT nasal spray   azithromycin (ZITHROMAX Z-PAK) 250 MG tablet      Follow-up: Return in about 6 months (around 10/12/2021) for HTN and , hyperlipidemia (fasting).  Wilfred Lacy, NP

## 2021-04-12 NOTE — Assessment & Plan Note (Signed)
Improved with decreased stress at home. Took 50mg  losartan x 1week, then decreased to 25mg  Home BP reading: 110s/80s BP Readings from Last 3 Encounters:  04/12/21 132/76  02/02/21 140/80  01/10/21 (!) 150/90   Maintain current medication dose

## 2021-05-09 ENCOUNTER — Encounter (HOSPITAL_COMMUNITY): Payer: Self-pay | Admitting: *Deleted

## 2021-05-09 ENCOUNTER — Ambulatory Visit (HOSPITAL_COMMUNITY)
Admission: EM | Admit: 2021-05-09 | Discharge: 2021-05-09 | Disposition: A | Discharge status: Home / Self Care | Payer: Managed Care, Other (non HMO) | Attending: Emergency Medicine | Admitting: Emergency Medicine

## 2021-05-09 ENCOUNTER — Other Ambulatory Visit: Payer: Self-pay

## 2021-05-09 DIAGNOSIS — J45901 Unspecified asthma with (acute) exacerbation: Secondary | ICD-10-CM | POA: Diagnosis not present

## 2021-05-09 DIAGNOSIS — J209 Acute bronchitis, unspecified: Secondary | ICD-10-CM | POA: Diagnosis not present

## 2021-05-09 MED ORDER — PREDNISONE 20 MG PO TABS: 40.0000 mg | ORAL_TABLET | Freq: Every day | ORAL | 0 refills | Status: DC

## 2021-05-09 NOTE — Discharge Instructions (Addendum)
Take the prednisone daily for the next 5 days.    Continue to to take the Zyrtec daily and use the nasal spray daily.  You can also continue to take Sudafed/Mucinex as needed for symptom management.    Rest and drink plenty of fluids, especially water.    Return or go to the Emergency Department if symptoms worsen or do not improve in the next few days.

## 2021-05-09 NOTE — ED Provider Notes (Signed)
[facility]    CSN: [id number] Arrival date & time: 05/09/21  0801      History   Chief Complaint Chief Complaint  Patient presents with   sinus infection   Hoarse   Cough   Sore Throat    HPI Claire Sutton is a 60 y.o. female.   Patient here for evaluation of sore throat, nasal congestion, and chest congestion that started Friday.  Reports using a spray to kill spiders prior to developing symptoms.  Also reports having a sinus infection about a month ago that was treated with antibiotics.  Reports history of seasonal allergies and asthma.  Reports taking zyrtec daily and using a nasal spray.  Also reports using an albuterol inhaler.  Reports starting Mucinex and Sudafed with some symptom relief.  Denies any fevers, chest pain, shortness of breath, N/V/D, numbness, tingling, weakness, abdominal pain, or headaches.     The history is provided by the patient.  Cough Associated symptoms: sore throat   Associated symptoms: no ear pain, no fever and no shortness of breath   Sore Throat Pertinent negatives include no shortness of breath.   Past Medical History:  Diagnosis Date   Allergy    ASCUS (atypical squamous cells of undetermined significance) on Pap smear 07/2006   NEG HIGH RISK HPV-- THEN IN 10/2006 AND 07/2007 PAPS WITH BENIGN REACTIVE CHANGES   Asthma    GERD (gastroesophageal reflux disease)    Headache    History of blood clot in brain    a. Fall age 34 - was told she had a blood clot in the brain from this.   Hyperlipidemia    Hypertension    LGSIL (low grade squamous intraepithelial dysplasia) 10/05   PAP ON 02/2005, 7,8,9,10,11,12WNL   Obesity    Seizures ([place])    a. began after she had a fall at age 41, last seizure age 61.    Patient Active Problem List   Diagnosis Date Noted   Postmenopausal estrogen deficiency 01/22/2019   History of colon polyps 06/13/2018   Chronic right hip pain 03/07/2018   Asthma 03/14/2017   Abnormal EKG 10/18/2016    Dyslipidemia 10/18/2016   Essential hypertension 09/15/2016   Obesity    Gastroesophageal reflux disease without esophagitis 09/07/2016   Acute bronchitis 09/07/2016   Intermediate uveitis of left eye 06/16/2014   Scleritis and episcleritis of left eye 06/16/2014    Past Surgical History:  Procedure Laterality Date   COLPOSCOPY     ENDOMETRIAL ABLATION  02/2007   NOVASURE/MARSUPILIZATION OF BARTHOLIN   MARSUPILIZATION OF BARTHOLIN  02/2007   AT TIME OF NOVASURE ABLATION SURGERY   TUBAL LIGATION      OB History     Gravida  3   Para  2   Term      Preterm      AB  1   Living  2      SAB      IAB  1   Ectopic      Multiple      Live Births               Home Medications    Prior to Admission medications   Medication Sig Start Date End Date Taking? Authorizing Provider  predniSONE (DELTASONE) 20 MG tablet Take 2 tablets (40 mg total) by mouth daily for 5 days. 05/09/21 05/14/21 Yes Pearson Forster, NP  albuterol [facility] HFA) 108 (90 Base) MCG/ACT inhaler Inhale 1-2 puffs  into the lungs every 6 (six) hours as needed for wheezing or shortness of breath. 07/13/20   Nche, Charlene Brooke, NP  atorvastatin (LIPITOR) 20 MG tablet Take 1 tablet (20 mg total) by mouth daily. 01/13/21   Nche, Charlene Brooke, NP  azithromycin (ZITHROMAX Z-PAK) 250 MG tablet Take 1 tablet (250 mg total) by mouth daily. Take 2tabs on first day, then 1tab once a day till complete 04/12/21   Nche, Charlene Brooke, NP  Cetirizine HCl (ZYRTEC PO) Take 1 tablet by mouth daily.     [provider]  Cholecalciferol (VITAMIN D PO) Take 1 tablet by mouth daily.     [provider]  Coenzyme Q10 (CO Q 10 PO) Take by mouth.    [provider]  Cyanocobalamin (B-12) 1000 MCG CAPS Take by mouth.    [provider]  Diclofenac Sodium (PENNSAID) 2 % SOLN Apply 1 inch topically 2 (two) times daily. 10/21/20   Dutch Quint B, FNP  EPINEPHrine 0.3 mg/0.3 mL IJ SOAJ injection  use as directed by prescriber 10/26/17   [provider]  losartan (COZAAR) 25 MG tablet Take 1-2 tablets (25-50 mg total) by mouth daily. Take 2tabs if BP >150/90 01/10/21   Nche, Charlene Brooke, NP  mometasone (NASONEX) 50 MCG/ACT nasal spray Place 2 sprays into the nose daily. 04/12/21   Nche, Charlene Brooke, NP  montelukast (SINGULAIR) 10 MG tablet Take 1 tablet (10 mg total) by mouth at bedtime. 01/10/21   Nche, Charlene Brooke, NP  Multiple Vitamins-Calcium (ONE-A-DAY WOMENS PO) Take 1 tablet by mouth daily.     [provider]  omeprazole (PRILOSEC) 20 MG capsule Take 1 capsule (20 mg total) by mouth daily. 01/10/21   Nche, Charlene Brooke, NP  Zinc 50 MG CAPS Take by mouth.    [provider]    Family History Family History  Problem Relation Age of Onset   Hypertension Mother    Hyperlipidemia Mother    Heart disease Mother        was told she may have had a few MIs that she was unaware of   Diabetes Father    Hypertension Sister    Diabetes Paternal Grandfather    Colon cancer Neg Hx    Esophageal cancer Neg Hx    Rectal cancer Neg Hx    Stomach cancer Neg Hx     Social History Social History   Tobacco Use   Smoking status: Never   Smokeless tobacco: Never  Vaping Use   Vaping Use: Never used  Substance Use Topics   Alcohol use: No    Alcohol/week: 0.0 standard drinks   Drug use: No     Allergies   Codeine   Review of Systems Review of Systems  Constitutional:  Negative for fever.  HENT:  Positive for congestion, sinus pressure, sore throat and voice change. Negative for ear pain and sinus pain.   Respiratory:  Positive for cough. Negative for shortness of breath.     Physical Exam Triage Vital Signs ED Triage Vitals  Enc Vitals Group     BP 05/09/21 0824 132/84     Pulse Rate 05/09/21 0824 72     Resp 05/09/21 0824 20     Temp 05/09/21 0824 99 F (37.2 C)     Temp src --      SpO2 05/09/21 0824 98 %     Weight --      Height --  Head Circumference --      Peak Flow --      Pain Score 05/09/21 0827 4     Pain Loc --      Pain Edu? --      Excl. in [place]? --    No data found.  Updated Vital Signs BP 132/84   Pulse 72   Temp 99 F (37.2 C)   Resp 20   SpO2 98%   Visual Acuity Right Eye Distance:   Left Eye Distance:   Bilateral Distance:    Right Eye Near:   Left Eye Near:    Bilateral Near:     Physical Exam Vitals and nursing note reviewed.  Constitutional:      General: She is not in acute distress.    Appearance: Normal appearance. She is not ill-appearing, toxic-appearing or diaphoretic.  HENT:     Head: Normocephalic and atraumatic.     Nose: Congestion and rhinorrhea present. No nasal tenderness. Rhinorrhea is clear.     Right Turbinates: Swollen.     Left Turbinates: Swollen.     Right Sinus: No maxillary sinus tenderness or frontal sinus tenderness.     Left Sinus: No maxillary sinus tenderness or frontal sinus tenderness.     Mouth/Throat:     Pharynx: Uvula midline. Posterior oropharyngeal erythema present. No pharyngeal swelling, oropharyngeal exudate or uvula swelling.     Tonsils: No tonsillar exudate or tonsillar abscesses. 0 on the right. 0 on the left.  Eyes:     Conjunctiva/sclera: Conjunctivae normal.  Cardiovascular:     Rate and Rhythm: Normal rate and regular rhythm.     Pulses: Normal pulses.     Heart sounds: Normal heart sounds.  Pulmonary:     Effort: Pulmonary effort is normal.     Breath sounds: Normal breath sounds.  Abdominal:     General: Abdomen is flat.  Musculoskeletal:        General: Normal range of motion.     Cervical back: Normal range of motion.  Skin:    General: Skin is warm and dry.  Neurological:     General: No focal deficit present.     Mental Status: She is alert and oriented to person, place, and time.  Psychiatric:        Mood and Affect: Mood normal.     UC Treatments / Results  Labs (all labs ordered are listed, but only  abnormal results are displayed) Labs Reviewed - No data to display  EKG   Radiology No results found.  Procedures Procedures (including critical care time)  Medications Ordered in UC Medications - No data to display  Initial Impression / Assessment and Plan / UC Course  I have reviewed the triage vital signs and the nursing notes.  Pertinent labs & imaging results that were available during my care of the patient were reviewed by me and considered in my medical decision making (see chart for details).    Assessment negative for red flags or concerns. Likely acute bronchitis vs asthma exacerbation.  Will treat with oral steroids for the next 5 days.  Continue daily zyrtec and nasal sprays.  May continue to use the OTC decongestants, such as sudafed or mucinex as needed.  Encouraged fluids and rest. Follow up with primary care as needed.  Final Clinical Impressions(s) / UC Diagnoses   Final diagnoses:  Acute bronchitis, unspecified organism  Asthma with acute exacerbation, unspecified asthma severity, unspecified whether persistent  Discharge Instructions      Take the prednisone daily for the next 5 days.    Continue to to take the Zyrtec daily and use the nasal spray daily.  You can also continue to take Sudafed/Mucinex as needed for symptom management.    Rest and drink plenty of fluids, especially water.    Return or go to the Emergency Department if symptoms worsen or do not improve in the next few days.      ED Prescriptions     Medication Sig Dispense Auth. Provider   predniSONE (DELTASONE) 20 MG tablet Take 2 tablets (40 mg total) by mouth daily for 5 days. 10 tablet Pearson Forster, NP      PDMP not reviewed this encounter.   Pearson Forster, NP 05/09/21 [phone]

## 2021-05-09 NOTE — ED Triage Notes (Signed)
Pt reports Sx's started after using  a spray to kill spiders. On Friday.

## 2021-05-10 ENCOUNTER — Telehealth (HOSPITAL_COMMUNITY): Payer: Self-pay

## 2021-05-10 MED ORDER — PREDNISONE 20 MG PO TABS: 40.0000 mg | ORAL_TABLET | Freq: Every day | ORAL | 0 refills | Status: AC

## 2021-05-25 ENCOUNTER — Encounter (HOSPITAL_COMMUNITY): Payer: Self-pay | Admitting: *Deleted

## 2021-05-25 ENCOUNTER — Other Ambulatory Visit: Payer: Self-pay

## 2021-05-25 ENCOUNTER — Ambulatory Visit (HOSPITAL_COMMUNITY)
Admission: EM | Admit: 2021-05-25 | Discharge: 2021-05-25 | Disposition: A | Discharge status: Home / Self Care | Payer: Managed Care, Other (non HMO) | Attending: Physician Assistant | Admitting: Physician Assistant

## 2021-05-25 DIAGNOSIS — M79605 Pain in left leg: Secondary | ICD-10-CM | POA: Diagnosis not present

## 2021-05-25 DIAGNOSIS — M7918 Myalgia, other site: Secondary | ICD-10-CM

## 2021-05-25 DIAGNOSIS — M5417 Radiculopathy, lumbosacral region: Secondary | ICD-10-CM | POA: Diagnosis not present

## 2021-05-25 MED ORDER — KETOROLAC TROMETHAMINE 30 MG/ML IJ SOLN
INTRAMUSCULAR | Status: AC
  Filled 2021-05-25: qty 1

## 2021-05-25 MED ORDER — TIZANIDINE HCL 4 MG PO CAPS: 4.0000 mg | ORAL_CAPSULE | Freq: Three times a day (TID) | ORAL | 0 refills | Status: DC

## 2021-05-25 MED ORDER — KETOROLAC TROMETHAMINE 30 MG/ML IJ SOLN
30.0000 mg | Freq: Once | INTRAMUSCULAR | Status: AC
  Administered 2021-05-25: 30 mg via INTRAMUSCULAR

## 2021-05-25 NOTE — ED Triage Notes (Signed)
Pt reports she tripped  with out fall yesterday. Pt now has Lt hip pain . Pt wearing knee sleeve and reports an old injury to Lt knee.
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
## 2021-05-25 NOTE — Discharge Instructions (Signed)
You were given Toradol in clinic today.  Please do not take any NSAIDs (aspirin, ibuprofen/Advil, naproxen/Aleve) for 24 hours.  You can use Tylenol for breakthrough pain.  I called in tizanidine which is a muscle relaxer.  He can take this 3 times a day but this will make you sleepy so do not drive or drink alcohol with this medication.  If your symptoms not improving to follow-up with your primary care provider as we discussed to consider an MRI or physical therapy referral.  If anything worsens please return for reevaluation.

## 2021-05-26 ENCOUNTER — Other Ambulatory Visit: Payer: Self-pay

## 2021-05-27 ENCOUNTER — Encounter: Payer: Self-pay | Admitting: Nurse Practitioner

## 2021-05-27 ENCOUNTER — Ambulatory Visit (INDEPENDENT_AMBULATORY_CARE_PROVIDER_SITE_OTHER): Payer: Managed Care, Other (non HMO)

## 2021-05-27 ENCOUNTER — Ambulatory Visit (INDEPENDENT_AMBULATORY_CARE_PROVIDER_SITE_OTHER): Payer: Managed Care, Other (non HMO) | Admitting: Nurse Practitioner

## 2021-05-27 VITALS — BP 130/72 | HR 67 | Temp 97.1°F | Ht 65.0 in | Wt 221.5 lb

## 2021-05-27 DIAGNOSIS — R208 Other disturbances of skin sensation: Secondary | ICD-10-CM | POA: Diagnosis not present

## 2021-05-27 DIAGNOSIS — M25552 Pain in left hip: Secondary | ICD-10-CM

## 2021-05-27 DIAGNOSIS — R200 Anesthesia of skin: Secondary | ICD-10-CM

## 2021-05-27 MED ORDER — EPINEPHRINE 0.3 MG/0.3ML IJ SOAJ: 0.3000 mg | INTRAMUSCULAR | 1 refills | Status: DC | PRN

## 2021-05-27 MED ORDER — IBUPROFEN 600 MG PO TABS: 600.0000 mg | ORAL_TABLET | Freq: Three times a day (TID) | ORAL | 0 refills | Status: DC | PRN

## 2021-05-27 NOTE — Patient Instructions (Addendum)
Go to lab for x-ray Continue zanaflex (muscle relaxant) and ibuprofen (NSAID).

## 2021-05-27 NOTE — Progress Notes (Signed)
Subjective:  Patient ID: Claire Sutton, female    DOB: [birth year]  Age: 60 y.o. MRN: [id number]  CC: Pain (Tripped a few days ago, no fall c/o left hip and leg pains. Follow up from urgent care. )  Hip Pain  Incident onset: onset 71month ago. The incident occurred at work. Injury mechanism: repetitve bending, lifting and twisting. The pain is present in the left leg, left hip, left thigh and left foot. The quality of the pain is described as shooting and cramping. The pain is at a severity of 8/10. The pain is severe. The pain has been Constant since onset. Associated symptoms include an inability to bear weight and numbness. Pertinent negatives include no loss of motion, loss of sensation, muscle weakness or tingling. She reports no foreign bodies present. The symptoms are aggravated by weight bearing and movement. She has tried NSAIDs and acetaminophen (muscle relaxant) for the symptoms. The treatment provided mild relief.  Onset of symptoms 241monthago, but worse in last 2days. Denies any recent fall, but had 5traumatic falls in last 5 decades (slipped in snow and/or ice, several falls on back and hip. No known fracture. She was not evaluated by any medication personal at this time) Reviewed ED notes from 05/25/2021, treated with toradol and zanaflex. She denies any saddle paresthesia, no GI/GU incontinence. Completed oral prednisone dose pack 2weeks ago for acute bronchitis.  Reviewed past Medical, Social and Family history today.  Outpatient Medications Prior to Visit  Medication Sig Dispense Refill   albuterol (PROAIR HFA) 108 (90 Base) MCG/ACT inhaler Inhale 1-2 puffs into the lungs every 6 (six) hours as needed for wheezing or shortness of breath. 6.7 g 1   atorvastatin (LIPITOR) 20 MG tablet Take 1 tablet (20 mg total) by mouth daily. 90 tablet 3   Cetirizine HCl (ZYRTEC PO) Take 1 tablet by mouth daily.      Cholecalciferol (VITAMIN D PO) Take 1 tablet by mouth daily.      Coenzyme  Q10 (CO Q 10 PO) Take by mouth.     Cyanocobalamin (B-12) 1000 MCG CAPS Take by mouth.     Diclofenac Sodium (PENNSAID) 2 % SOLN Apply 1 inch topically 2 (two) times daily. 112 g 1   losartan (COZAAR) 25 MG tablet Take 1-2 tablets (25-50 mg total) by mouth daily. Take 2tabs if BP >150/90 180 tablet 3   mometasone (NASONEX) 50 MCG/ACT nasal spray Place 2 sprays into the nose daily. 1 each 12   montelukast (SINGULAIR) 10 MG tablet Take 1 tablet (10 mg total) by mouth at bedtime. 90 tablet 3   Multiple Vitamins-Calcium (ONE-A-DAY WOMENS PO) Take 1 tablet by mouth daily.      omeprazole (PRILOSEC) 20 MG capsule Take 1 capsule (20 mg total) by mouth daily. 90 capsule 3   tiZANidine (ZANAFLEX) 4 MG capsule Take 1 capsule (4 mg total) by mouth 3 (three) times daily. 21 capsule 0   Zinc 50 MG CAPS Take by mouth.     EPINEPHrine 0.3 mg/0.3 mL IJ SOAJ injection use as directed by prescriber  0   azithromycin (ZITHROMAX Z-PAK) 250 MG tablet Take 1 tablet (250 mg total) by mouth daily. Take 2tabs on first day, then 1tab once a day till complete 6 tablet 0   No facility-administered medications prior to visit.    ROS See HPI  Objective:  BP 130/72 (BP Location: Right Arm, Patient Position: Sitting, Cuff Size: Large)   Pulse 67   Temp (!) 97.1  F (36.2 C) (Temporal)   Ht '5\' 5"'$  (1.651 m)   Wt 221 lb 8 oz (100.5 kg)   SpO2 99%   BMI 36.86 kg/m   Physical Exam Vitals reviewed.  Pulmonary:     Effort: Pulmonary effort is normal.  Musculoskeletal:        General: Tenderness present.     Lumbar back: No signs of trauma, spasms, tenderness or bony tenderness. Normal range of motion. Positive left straight leg raise test. Negative right straight leg raise test.     Right hip: Normal.     Left hip: Tenderness and bony tenderness present. No deformity, lacerations or crepitus. Decreased range of motion. Decreased strength.     Right upper leg: Normal.     Left upper leg: Normal.     Right knee:  Normal.     Left knee: Normal.     Right lower leg: Normal. No edema.     Left lower leg: Normal. No edema.     Right ankle: Normal.     Left ankle: Normal.     Right foot: Normal.     Left foot: Normal.  Skin:    Findings: No erythema or rash.  Neurological:     Mental Status: She is alert and oriented to person, place, and time.    Assessment & Plan:  This visit occurred during the SARS-CoV-2 public health emergency.  Safety protocols were in place, including screening questions prior to the visit, additional usage of staff PPE, and extensive cleaning of exam room while observing appropriate contact time as indicated for disinfecting solutions.   Piriformis Syndrome vs lumbar spine radiculopathy? Despite lack of traumatic injury, order hip and lumbar spine x-ray due to left foot numbness. Provided work note to be out of work for 1week to minimize prolonged standing/walking, bending and lifting.  Claire Sutton was seen today for pain.  Diagnoses and all orders for this visit:  Hip pain, left -     DG Lumbar Spine Complete -     DG HIP UNILAT W OR W/O PELVIS MIN 4 VIEWS LEFT -     ibuprofen (ADVIL) 600 MG tablet; Take 1 tablet (600 mg total) by mouth every 8 (eight) hours as needed. With food  Numbness of left foot -     DG Lumbar Spine Complete -     DG HIP UNILAT W OR W/O PELVIS MIN 4 VIEWS LEFT -     ibuprofen (ADVIL) 600 MG tablet; Take 1 tablet (600 mg total) by mouth every 8 (eight) hours as needed. With food  Other orders -     EPINEPHrine 0.3 mg/0.3 mL IJ SOAJ injection; Inject 0.3 mg into the muscle as needed for anaphylaxis.   Problem List Items Addressed This Visit   None Visit Diagnoses     Hip pain, left    -  Primary   Relevant Medications   ibuprofen (ADVIL) 600 MG tablet   Other Relevant Orders   DG Lumbar Spine Complete   DG HIP UNILAT W OR W/O PELVIS MIN 4 VIEWS LEFT   Numbness of left foot       Relevant Medications   ibuprofen (ADVIL) 600 MG tablet    Other Relevant Orders   DG Lumbar Spine Complete   DG HIP UNILAT W OR W/O PELVIS MIN 4 VIEWS LEFT       Follow-up: No follow-ups on file.  Wilfred Lacy, NP

## 2021-06-02 ENCOUNTER — Other Ambulatory Visit: Payer: Self-pay | Admitting: Nurse Practitioner

## 2021-06-02 ENCOUNTER — Telehealth: Payer: Self-pay | Admitting: Nurse Practitioner

## 2021-06-02 ENCOUNTER — Encounter: Payer: Self-pay | Admitting: Nurse Practitioner

## 2021-06-02 ENCOUNTER — Ambulatory Visit: Payer: Managed Care, Other (non HMO) | Admitting: Nurse Practitioner

## 2021-06-02 DIAGNOSIS — M79671 Pain in right foot: Secondary | ICD-10-CM

## 2021-06-02 NOTE — Telephone Encounter (Signed)
Will send mychart message to provider

## 2021-06-02 NOTE — Telephone Encounter (Signed)
This is in reference to the message she sent earlier today. Pt has called in wanting to make sure Baldo Ash saw her message she sent through [place]. She has to be at work today at [employer], she would like to know if there should be any work restrictions placed on her for tonight. Please advise pt at [phone].

## 2021-06-03 NOTE — Telephone Encounter (Signed)
Patient notified VIA phone that letter is ready. Dm/cma

## 2021-06-03 NOTE — Telephone Encounter (Signed)
Pt wanted to know if she can pick up letter today before we close

## 2021-06-09 ENCOUNTER — Other Ambulatory Visit: Payer: Self-pay

## 2021-06-09 ENCOUNTER — Ambulatory Visit (INDEPENDENT_AMBULATORY_CARE_PROVIDER_SITE_OTHER): Payer: Managed Care, Other (non HMO)

## 2021-06-09 ENCOUNTER — Ambulatory Visit (INDEPENDENT_AMBULATORY_CARE_PROVIDER_SITE_OTHER): Payer: Managed Care, Other (non HMO) | Admitting: Podiatry

## 2021-06-09 DIAGNOSIS — M79672 Pain in left foot: Secondary | ICD-10-CM

## 2021-06-09 DIAGNOSIS — M79671 Pain in right foot: Secondary | ICD-10-CM

## 2021-06-09 DIAGNOSIS — M722 Plantar fascial fibromatosis: Secondary | ICD-10-CM

## 2021-06-09 MED ORDER — TRIAMCINOLONE ACETONIDE 10 MG/ML IJ SUSP
20.0000 mg | Freq: Once | INTRAMUSCULAR | Status: AC
  Administered 2021-06-09: 20 mg

## 2021-06-09 MED ORDER — DICLOFENAC SODIUM 75 MG PO TBEC: 75.0000 mg | DELAYED_RELEASE_TABLET | Freq: Two times a day (BID) | ORAL | 2 refills | Status: DC

## 2021-06-09 NOTE — Patient Instructions (Signed)

## 2021-06-10 NOTE — Progress Notes (Signed)
Subjective:   Patient ID: Claire Sutton, female   DOB: 60 y.o.   MRN: [id number]   HPI Patient presents stating she has developed a lot of pain in both of her feet and states it seems like the heels are worse but she is also had history of back problems and get some nervelike numbing pain especially in the left heel.  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  Patient does not smoke likes to be active   Review of Systems  All other systems reviewed and are negative.      Objective:  Physical Exam Vitals and nursing note reviewed.  Constitutional:      Appearance: She is well-developed.  Pulmonary:     Effort: Pulmonary effort is normal.  Musculoskeletal:        General: Normal range of motion.  Skin:    General: Skin is warm.  Neurological:     Mental Status: She is alert.    Neurovascular status was found to be intact muscle strength was found to be adequate range of motion adequate with patient found to have no indications of neurological deficit currently patient is found to have swelling and discomfort in the plantar fascial right over left with inflammation and mild discomfort on the outside of the foot consistent with probable compensatory peroneal tendinitis with good digital perfusion well oriented x3     Assessment:  Acute Planter fasciitis bilateral with inflammation with possibility for nerve irritation from back issues or other pathology     Plan:  H&P reviewed all conditions and explained relationships to these problems.  At this point I will get a focus on the acute inflammation and I did sterile prep and injected the plantar fascial bilateral 3 mg Kenalog 5 mg Xylocaine applied fascial brace bilateral placed on oral anti-inflammatory and will reappoint again in 2 weeks with encouragement call if any issues were to occur  X-rays did not indicate spur formation or indications  of stress fracture associated with condition

## 2021-06-23 ENCOUNTER — Ambulatory Visit (INDEPENDENT_AMBULATORY_CARE_PROVIDER_SITE_OTHER): Payer: Managed Care, Other (non HMO) | Admitting: Podiatry

## 2021-06-23 ENCOUNTER — Other Ambulatory Visit: Payer: Self-pay

## 2021-06-23 ENCOUNTER — Encounter: Payer: Self-pay | Admitting: Podiatry

## 2021-06-23 DIAGNOSIS — M722 Plantar fascial fibromatosis: Secondary | ICD-10-CM

## 2021-06-23 MED ORDER — TRIAMCINOLONE ACETONIDE 10 MG/ML IJ SUSP
10.0000 mg | Freq: Once | INTRAMUSCULAR | Status: AC
Start: 2021-06-23 — End: 2021-06-23
  Administered 2021-06-23: 10 mg

## 2021-06-23 NOTE — Progress Notes (Signed)
Subjective:   Patient ID: Claire Sutton, female   DOB: 60 y.o.   MRN: [id number]   HPI Patient presents stating her foot has improved she still getting some discomfort in the left and she works on cement floors all the time with moderate obesity and a lot of stress occurring against her plantar arch bilateral   ROS      Objective:  Physical Exam  Fasciitis-like symptomatology present left improved right with moderate flatfoot deformity     Assessment:  Planter fasciitis present with inflammation pain with palpation along with flatfoot     Plan:  H&P reviewed condition recommended orthotics and patient is casted for functional orthotic devices and I did do a more proximal injection of the left plantar fascia during the area of maximum discomfort.  Reappoint when orthotics return or earlier if needed

## 2021-07-05 ENCOUNTER — Other Ambulatory Visit: Payer: Self-pay | Admitting: Nurse Practitioner

## 2021-07-05 DIAGNOSIS — R200 Anesthesia of skin: Secondary | ICD-10-CM

## 2021-07-05 DIAGNOSIS — R208 Other disturbances of skin sensation: Secondary | ICD-10-CM

## 2021-07-05 DIAGNOSIS — M25552 Pain in left hip: Secondary | ICD-10-CM

## 2021-07-05 MED ORDER — IBUPROFEN 600 MG PO TABS: 600.0000 mg | ORAL_TABLET | Freq: Three times a day (TID) | ORAL | 0 refills | Status: DC | PRN

## 2021-07-05 NOTE — Telephone Encounter (Signed)
Refills have been requested for the following medications:       ibuprofen (ADVIL) 600 MG tablet [Charlotte Nche]     Patient Comment: May I please have the ibuprofen refilled. I'm still having issues with inflammation in my left heel and the ibuprofen helps. I take it as needed.    Preferred pharmacy: [facility]

## 2021-07-28 ENCOUNTER — Other Ambulatory Visit: Payer: Self-pay

## 2021-07-28 ENCOUNTER — Ambulatory Visit (INDEPENDENT_AMBULATORY_CARE_PROVIDER_SITE_OTHER): Payer: Managed Care, Other (non HMO)

## 2021-07-28 DIAGNOSIS — M722 Plantar fascial fibromatosis: Secondary | ICD-10-CM

## 2021-07-28 NOTE — Progress Notes (Signed)
Patient in office today to pick up custom orthotics. Patient tried on the new custom orthotics and was satisfied with the fit and feel of the custom inserts. Patient was educated on the break-in process at this time. Patient verbalized understanding. Advised patient to call the office with any questions, comments or concerns

## 2021-08-23 ENCOUNTER — Encounter: Payer: Self-pay | Admitting: Nurse Practitioner

## 2021-08-23 ENCOUNTER — Ambulatory Visit: Payer: Managed Care, Other (non HMO) | Admitting: Nurse Practitioner

## 2021-08-31 ENCOUNTER — Other Ambulatory Visit: Payer: Self-pay

## 2021-08-31 ENCOUNTER — Encounter: Payer: Self-pay | Admitting: Nurse Practitioner

## 2021-08-31 ENCOUNTER — Ambulatory Visit (INDEPENDENT_AMBULATORY_CARE_PROVIDER_SITE_OTHER): Payer: Managed Care, Other (non HMO) | Admitting: Nurse Practitioner

## 2021-08-31 VITALS — BP 158/90 | HR 70 | Temp 97.5°F | Wt 236.4 lb

## 2021-08-31 DIAGNOSIS — M5442 Lumbago with sciatica, left side: Secondary | ICD-10-CM | POA: Diagnosis not present

## 2021-08-31 DIAGNOSIS — M48061 Spinal stenosis, lumbar region without neurogenic claudication: Secondary | ICD-10-CM

## 2021-08-31 DIAGNOSIS — R200 Anesthesia of skin: Secondary | ICD-10-CM | POA: Diagnosis not present

## 2021-08-31 DIAGNOSIS — M51369 Other intervertebral disc degeneration, lumbar region without mention of lumbar back pain or lower extremity pain: Secondary | ICD-10-CM

## 2021-08-31 DIAGNOSIS — M5136 Other intervertebral disc degeneration, lumbar region: Secondary | ICD-10-CM

## 2021-08-31 DIAGNOSIS — G8929 Other chronic pain: Secondary | ICD-10-CM

## 2021-08-31 DIAGNOSIS — M25551 Pain in right hip: Secondary | ICD-10-CM | POA: Diagnosis not present

## 2021-08-31 DIAGNOSIS — M5441 Lumbago with sciatica, right side: Secondary | ICD-10-CM | POA: Diagnosis not present

## 2021-08-31 DIAGNOSIS — M25552 Pain in left hip: Secondary | ICD-10-CM

## 2021-08-31 MED ORDER — CYCLOBENZAPRINE HCL 10 MG PO TABS: 5.0000 mg | ORAL_TABLET | Freq: Every evening | ORAL | 1 refills | Status: DC | PRN

## 2021-08-31 MED ORDER — IBUPROFEN 600 MG PO TABS: 600.0000 mg | ORAL_TABLET | Freq: Three times a day (TID) | ORAL | 2 refills | Status: DC | PRN

## 2021-08-31 NOTE — Progress Notes (Signed)
Subjective:  Patient ID: Claire Sutton, female    DOB: 01/15/1961  Age: 60 y.o. MRN: [id number]  CC: Acute Home Visit (Pt would like to discuss paperwork for intermitted leave from work due to pain in hips and thighs, along with some foot pain. /Pt states the pain is bilateral in the hips and across lower back. Pt states she has been to the foot specialist and still has numbness in the heel of both feet. )  HPI Chronic bilateral low back pain with bilateral sciatica Claire Sutton reports persistent hip pain and bilateral LE radiculopathy despite home exercise( daily stretching lower back and hips, use of ibuprofen and zanaflex).  Heel pain has improved with corticosteroid injection, shoe inserts and ankle brace, but persistent numbness in hindfeet. Also has intermittent knee instability. Symptoms are worse laying down, prolonged standing or walking. Minimal improvement with ibuprofen No previous hip or back or knee injury. No saddle paresthesia, no muscle weakness. No change in GI/GU function. She is requesting for FMLA form to be completed. Current job entails prolonged standing and walking 8-12hrs 5-6days a week. L. Hip x-ray with pelvis and lumbar spine x-ray 05/2021: normal  FMLA form will be completed (1day per week) till 03/05/2022 Order MRI lumbar spine Continue ibuprofen and flexeril Consider outpatient PT with normal MRI  Reviewed past Medical, Social and Family history today.  Outpatient Medications Prior to Visit  Medication Sig Dispense Refill   albuterol (PROAIR HFA) 108 (90 Base) MCG/ACT inhaler Inhale 1-2 puffs into the lungs every 6 (six) hours as needed for wheezing or shortness of breath. 6.7 g 1   atorvastatin (LIPITOR) 20 MG tablet Take 1 tablet (20 mg total) by mouth daily. 90 tablet 3   Cetirizine HCl (ZYRTEC PO) Take 1 tablet by mouth daily.      Cholecalciferol (VITAMIN D PO) Take 1 tablet by mouth daily.      Coenzyme Q10 (CO Q 10 PO) Take by mouth.      Cyanocobalamin (B-12) 1000 MCG CAPS Take by mouth.     Diclofenac Sodium (PENNSAID) 2 % SOLN Apply 1 inch topically 2 (two) times daily. 112 g 1   EPINEPHrine 0.3 mg/0.3 mL IJ SOAJ injection Inject 0.3 mg into the muscle as needed for anaphylaxis. 1 each 1   losartan (COZAAR) 25 MG tablet Take 1-2 tablets (25-50 mg total) by mouth daily. Take 2tabs if BP >150/90 180 tablet 3   mometasone (NASONEX) 50 MCG/ACT nasal spray Place 2 sprays into the nose daily. 1 each 12   montelukast (SINGULAIR) 10 MG tablet Take 1 tablet (10 mg total) by mouth at bedtime. 90 tablet 3   Multiple Vitamins-Calcium (ONE-A-DAY WOMENS PO) Take 1 tablet by mouth daily.      omeprazole (PRILOSEC) 20 MG capsule Take 1 capsule (20 mg total) by mouth daily. 90 capsule 3   Zinc 50 MG CAPS Take by mouth.     diclofenac (VOLTAREN) 75 MG EC tablet Take 1 tablet (75 mg total) by mouth 2 (two) times daily. 50 tablet 2   ibuprofen (ADVIL) 600 MG tablet Take 1 tablet (600 mg total) by mouth every 8 (eight) hours as needed. With food 30 tablet 0   tiZANidine (ZANAFLEX) 4 MG capsule Take 1 capsule (4 mg total) by mouth 3 (three) times daily. 21 capsule 0   No facility-administered medications prior to visit.   ROS See HPI  Objective:  BP (!) 158/90 (BP Location: Left Arm, Patient Position: Sitting, Cuff Size:  Large)   Pulse 70   Temp (!) 97.5 F (36.4 C) (Temporal)   Wt 236 lb 6.4 oz (107.2 kg)   SpO2 98%   BMI 39.34 kg/m   Physical Exam Vitals reviewed.  Cardiovascular:     Rate and Rhythm: Normal rate.     Pulses: Normal pulses.  Pulmonary:     Effort: Pulmonary effort is normal.     Breath sounds: Normal breath sounds.  Musculoskeletal:        General: No swelling, tenderness, deformity or signs of injury. Normal range of motion.     Right lower leg: No edema.     Left lower leg: No edema.  Skin:    General: Skin is warm and dry.  Neurological:     Mental Status: She is alert and oriented to person, place, and  time.     Gait: Gait normal.  Psychiatric:        Mood and Affect: Mood normal.        Behavior: Behavior normal.        Thought Content: Thought content normal.    Assessment & Plan:  This visit occurred during the SARS-CoV-2 public health emergency.  Safety protocols were in place, including screening questions prior to the visit, additional usage of staff PPE, and extensive cleaning of exam room while observing appropriate contact time as indicated for disinfecting solutions.   Claire Sutton was seen today for acute home visit.  Diagnoses and all orders for this visit:  Chronic bilateral low back pain with bilateral sciatica -     ibuprofen (ADVIL) 600 MG tablet; Take 1 tablet (600 mg total) by mouth every 8 (eight) hours as needed. With food -     cyclobenzaprine (FLEXERIL) 10 MG tablet; Take 0.5-1 tablets (5-10 mg total) by mouth at bedtime as needed for muscle spasms. -     MR Lumbar Spine Wo Contrast; Future  Chronic pain of both hips -     ibuprofen (ADVIL) 600 MG tablet; Take 1 tablet (600 mg total) by mouth every 8 (eight) hours as needed. With food -     cyclobenzaprine (FLEXERIL) 10 MG tablet; Take 0.5-1 tablets (5-10 mg total) by mouth at bedtime as needed for muscle spasms. -     MR Lumbar Spine Wo Contrast; Future  Numbness of left foot  Problem List Items Addressed This Visit       Nervous and Auditory   Chronic bilateral low back pain with bilateral sciatica - Primary    Claire Sutton reports persistent hip pain and bilateral LE radiculopathy despite home exercise( daily stretching lower back and hips, use of ibuprofen and zanaflex).  Heel pain has improved with corticosteroid injection, shoe inserts and ankle brace, but persistent numbness in hindfeet. Also has intermittent knee instability. Symptoms are worse laying down, prolonged standing or walking. Minimal improvement with ibuprofen No previous hip or back or knee injury. No saddle paresthesia, no muscle weakness.  No change in GI/GU function. She is requesting for FMLA form to be completed. Current job entails prolonged standing and walking 8-12hrs 5-6days a week. L. Hip x-ray with pelvis and lumbar spine x-ray 05/2021: normal  FMLA form will be completed (1day per week) till 03/05/2022 Order MRI lumbar spine Continue ibuprofen and flexeril Consider outpatient PT with normal MRI      Relevant Medications   ibuprofen (ADVIL) 600 MG tablet   cyclobenzaprine (FLEXERIL) 10 MG tablet   Other Relevant Orders   MR Lumbar Spine Wo  Contrast     Other   Hip pain, chronic   Relevant Medications   ibuprofen (ADVIL) 600 MG tablet   cyclobenzaprine (FLEXERIL) 10 MG tablet   Other Relevant Orders   MR Lumbar Spine Wo Contrast   Other Visit Diagnoses     Numbness of left foot           Follow-up: No follow-ups on file.  Wilfred Lacy, NP

## 2021-08-31 NOTE — Patient Instructions (Signed)
FMLA form will be completed (1day per week) till 03/05/2022 You will be contacted to schedule appt for lumbar MRI. Use ibuprofen and flexeril as prescribed

## 2021-08-31 NOTE — Assessment & Plan Note (Addendum)
Ms. Steury reports persistent hip pain and bilateral LE radiculopathy despite home exercise( daily stretching lower back and hips, use of ibuprofen and zanaflex).  Heel pain has improved with corticosteroid injection, shoe inserts and ankle brace, but persistent numbness in hindfeet. Also has intermittent knee instability. Symptoms are worse laying down, prolonged standing or walking. Minimal improvement with ibuprofen No previous hip or back or knee injury. No saddle paresthesia, no muscle weakness. No change in GI/GU function. She is requesting for FMLA form to be completed. Current job entails prolonged standing and walking 8-12hrs 5-6days a week. L. Hip x-ray with pelvis and lumbar spine x-ray 05/2021: normal  FMLA form will be completed (1day per week) till 03/05/2022 Order MRI lumbar spine Continue ibuprofen and flexeril Consider outpatient PT with normal MRI

## 2021-09-07 ENCOUNTER — Encounter: Payer: Self-pay | Admitting: Nurse Practitioner

## 2021-09-07 NOTE — Telephone Encounter (Signed)
Pt dropped letter from [employer]... I put it in Charlotte's folder up front.

## 2021-09-15 ENCOUNTER — Telehealth: Payer: Self-pay | Admitting: Nurse Practitioner

## 2021-09-15 NOTE — Telephone Encounter (Signed)
Pt calling about her FMLA paperwork.... I can not find it anywhere??

## 2021-09-16 NOTE — Telephone Encounter (Signed)
Forms completed, faxed, and a copy given to the patient.

## 2021-09-19 ENCOUNTER — Other Ambulatory Visit: Payer: Self-pay

## 2021-09-19 ENCOUNTER — Ambulatory Visit
Admission: RE | Admit: 2021-09-19 | Discharge: 2021-09-19 | Disposition: A | Discharge status: Home / Self Care | Payer: Managed Care, Other (non HMO) | Source: Ambulatory Visit | Attending: Nurse Practitioner | Admitting: Nurse Practitioner

## 2021-09-19 DIAGNOSIS — M5441 Lumbago with sciatica, right side: Secondary | ICD-10-CM

## 2021-09-19 DIAGNOSIS — G8929 Other chronic pain: Secondary | ICD-10-CM

## 2021-09-21 DIAGNOSIS — R200 Anesthesia of skin: Secondary | ICD-10-CM | POA: Insufficient documentation

## 2021-09-21 DIAGNOSIS — M5136 Other intervertebral disc degeneration, lumbar region: Secondary | ICD-10-CM | POA: Insufficient documentation

## 2021-09-21 DIAGNOSIS — M48061 Spinal stenosis, lumbar region without neurogenic claudication: Secondary | ICD-10-CM | POA: Insufficient documentation

## 2021-09-21 MED ORDER — PREDNISONE 20 MG PO TABS: ORAL_TABLET | ORAL | 0 refills | Status: AC

## 2021-09-21 NOTE — Addendum Note (Signed)
Addended by: Wilfred Lacy L on: 09/21/2021 12:09 PM   Modules accepted: Orders

## 2021-09-22 ENCOUNTER — Telehealth: Payer: Self-pay | Admitting: Nurse Practitioner

## 2021-09-23 ENCOUNTER — Encounter: Payer: Self-pay | Admitting: Nurse Practitioner

## 2021-09-23 NOTE — Telephone Encounter (Signed)
Informed patient form has been updated and sent to the company. Patient verbalized understanding

## 2021-09-26 NOTE — Telephone Encounter (Signed)
Pt was informed once forms have been completed she will be contacted.

## 2021-09-26 NOTE — Telephone Encounter (Signed)
Pt called regarding her FMLA paperwork. Claire Sutton agreed to write her out until 10/19/21. Pt brought requested form to our office on 09/23/21. She would like an update. Her employer is "giving her grief" everyday. Thank  you!

## 2021-10-03 ENCOUNTER — Telehealth: Payer: Self-pay | Admitting: Nurse Practitioner

## 2021-10-04 NOTE — Telephone Encounter (Signed)
Pt called needing the date on her FMLA paper work changed from the 18th to the 16th........Marland Kitchen

## 2021-10-05 ENCOUNTER — Other Ambulatory Visit: Payer: Self-pay | Admitting: Nurse Practitioner

## 2021-10-05 DIAGNOSIS — M25551 Pain in right hip: Secondary | ICD-10-CM

## 2021-10-05 DIAGNOSIS — G8929 Other chronic pain: Secondary | ICD-10-CM

## 2021-10-05 DIAGNOSIS — M5442 Lumbago with sciatica, left side: Secondary | ICD-10-CM

## 2021-10-05 NOTE — Telephone Encounter (Signed)
Chart supports Rx Last seen 08/2021 Next OV 10/2021

## 2021-10-06 NOTE — Telephone Encounter (Signed)
Pt was informed copies of her form were available for pick up

## 2021-10-10 ENCOUNTER — Ambulatory Visit
Admission: RE | Admit: 2021-10-10 | Discharge: 2021-10-10 | Disposition: A | Discharge status: Home / Self Care | Payer: Managed Care, Other (non HMO) | Source: Ambulatory Visit | Attending: Nurse Practitioner | Admitting: Nurse Practitioner

## 2021-10-10 DIAGNOSIS — Z1231 Encounter for screening mammogram for malignant neoplasm of breast: Secondary | ICD-10-CM

## 2021-10-10 NOTE — Telephone Encounter (Signed)
Pt called to follow up on dates for FMLA being updated. She said the initial FMLA was intermittent and was then changed to continuous leave. Pt said her employer back dated the continuous leave to 09/21/2021. Pt needs form "period of incapacity" to be updated to 09/21/2021-10/19/2021 and refaxed to [employer].   Please call pt to confirm when completed.   Phone # [phone]

## 2021-10-11 ENCOUNTER — Other Ambulatory Visit: Payer: Self-pay | Admitting: Nurse Practitioner

## 2021-10-11 DIAGNOSIS — R928 Other abnormal and inconclusive findings on diagnostic imaging of breast: Secondary | ICD-10-CM

## 2021-10-12 NOTE — Telephone Encounter (Signed)
Spoke with patient and she states her employer informed her that her continous leave dates were 09/21/21 until 10/19/21 because the 09/21/21  date is when she called out of work and and not been in since. The informed her that her provider needed to update the forms by crossing the date out initialing it and sending it back with the updated correct date. They states that she did not need any forms for intermitted leave because she never returned to work in between the 09/19/21-09/21/21 date. Information given to provider.

## 2021-10-12 NOTE — Telephone Encounter (Signed)
Forms updated, faxed, and patient notified.

## 2021-10-14 ENCOUNTER — Other Ambulatory Visit: Payer: Self-pay

## 2021-10-17 ENCOUNTER — Other Ambulatory Visit: Payer: Self-pay

## 2021-10-17 ENCOUNTER — Encounter: Payer: Self-pay | Admitting: Nurse Practitioner

## 2021-10-17 ENCOUNTER — Ambulatory Visit (INDEPENDENT_AMBULATORY_CARE_PROVIDER_SITE_OTHER): Payer: Managed Care, Other (non HMO) | Admitting: Nurse Practitioner

## 2021-10-17 VITALS — BP 144/90 | HR 66 | Temp 96.4°F | Wt 235.2 lb

## 2021-10-17 DIAGNOSIS — J0100 Acute maxillary sinusitis, unspecified: Secondary | ICD-10-CM

## 2021-10-17 DIAGNOSIS — E785 Hyperlipidemia, unspecified: Secondary | ICD-10-CM

## 2021-10-17 DIAGNOSIS — I10 Essential (primary) hypertension: Secondary | ICD-10-CM

## 2021-10-17 LAB — BASIC METABOLIC PANEL
BUN: 15 mg/dL (ref 6–23)
CO2: 25 mEq/L (ref 19–32)
Calcium: 9.3 mg/dL (ref 8.4–10.5)
Chloride: 107 mEq/L (ref 96–112)
Creatinine, Ser: 0.85 mg/dL (ref 0.40–1.20)
GFR: 73.79 mL/min (ref >60.00)
Glucose, Bld: 88 mg/dL (ref 70–99)
Potassium: 4 mEq/L (ref 3.5–5.1)
Sodium: 141 mEq/L (ref 135–145)

## 2021-10-17 LAB — LDL CHOLESTEROL, DIRECT: Direct LDL: 104 mg/dL

## 2021-10-17 MED ORDER — LOSARTAN POTASSIUM 50 MG PO TABS
50.0000 mg | ORAL_TABLET | Freq: Every day | ORAL | 3 refills | Status: DC
Start: 2021-10-17 — End: 2022-06-29

## 2021-10-17 MED ORDER — GUAIFENESIN ER 600 MG PO TB12: 600.0000 mg | ORAL_TABLET | Freq: Two times a day (BID) | ORAL | 0 refills | Status: DC | PRN

## 2021-10-17 MED ORDER — AZITHROMYCIN 250 MG PO TABS: 250.0000 mg | ORAL_TABLET | Freq: Every day | ORAL | 0 refills | Status: DC

## 2021-10-17 MED ORDER — AMLODIPINE BESYLATE 5 MG PO TABS: 5.0000 mg | ORAL_TABLET | Freq: Every day | ORAL | 3 refills | Status: DC

## 2021-10-17 NOTE — Patient Instructions (Addendum)
Go to lab for blood draw Maintain losartan 50mg  in AM Add amlodipine 5mg  in PM F/up in 52month

## 2021-10-17 NOTE — Progress Notes (Signed)
Subjective:  Patient ID: Claire Sutton, female    DOB: 11-14-1960  Age: 60 y.o. MRN: [id number]  CC: Follow-up (6 month f/u on HTN and cholesterol. /Pt is not fasting. )  Sinusitis This is a recurrent problem. The current episode started 1 to 4 weeks ago. The problem is unchanged. There has been no fever. Associated symptoms include congestion, sinus pressure and swollen glands. Pertinent negatives include no chills, coughing, diaphoresis, ear pain, headaches, hoarse voice, neck pain, shortness of breath, sneezing or sore throat. Past treatments include oral decongestants (flonase, montelukast). The treatment provided mild relief.  Had dental exam 1week ago, normal per patient.  Essential hypertension Repeat BP in lying position: 158/82 (L) and 156/84 (R). Reports home BP reading 130s/80s to 150s/90s She thinks elevated BP is related to work stress and back pain. She does maintain DASH diet Limited exercise due to back pain. BP Readings from Last 3 Encounters:  10/17/21 (!) 144/90  08/31/21 (!) 158/90  05/27/21 130/72   Maintain losartan dose at 50mg  in AM Add amlodipine 5mg  in PM. Repeat bmp F/up in 67month  BP Readings from Last 3 Encounters:  10/17/21 (!) 144/90  08/31/21 (!) 158/90  05/27/21 130/72    Reviewed past Medical, Social and Family history today.  Outpatient Medications Prior to Visit  Medication Sig Dispense Refill   albuterol (PROAIR HFA) 108 (90 Base) MCG/ACT inhaler Inhale 1-2 puffs into the lungs every 6 (six) hours as needed for wheezing or shortness of breath. 6.7 g 1   atorvastatin (LIPITOR) 20 MG tablet Take 1 tablet (20 mg total) by mouth daily. 90 tablet 3   Cetirizine HCl (ZYRTEC PO) Take 1 tablet by mouth daily.      Cholecalciferol (VITAMIN D PO) Take 1 tablet by mouth daily.      Coenzyme Q10 (CO Q 10 PO) Take by mouth.     Cyanocobalamin (B-12) 1000 MCG CAPS Take by mouth.     Diclofenac Sodium (PENNSAID) 2 % SOLN Apply 1 inch topically 2  (two) times daily. 112 g 1   EPINEPHrine 0.3 mg/0.3 mL IJ SOAJ injection Inject 0.3 mg into the muscle as needed for anaphylaxis. 1 each 1   ibuprofen (ADVIL) 600 MG tablet TAKE 1 TABLET(600 MG) BY MOUTH EVERY 8 HOURS WITH FOOD AS NEEDED 30 tablet 2   mometasone (NASONEX) 50 MCG/ACT nasal spray Place 2 sprays into the nose daily. 1 each 12   montelukast (SINGULAIR) 10 MG tablet Take 1 tablet (10 mg total) by mouth at bedtime. 90 tablet 3   Multiple Vitamins-Calcium (ONE-A-DAY WOMENS PO) Take 1 tablet by mouth daily.      omeprazole (PRILOSEC) 20 MG capsule Take 1 capsule (20 mg total) by mouth daily. 90 capsule 3   Zinc 50 MG CAPS Take by mouth.     losartan (COZAAR) 25 MG tablet Take 1-2 tablets (25-50 mg total) by mouth daily. Take 2tabs if BP >150/90 180 tablet 3   cyclobenzaprine (FLEXERIL) 10 MG tablet Take 0.5-1 tablets (5-10 mg total) by mouth at bedtime as needed for muscle spasms. (Patient not taking: Reported on 10/17/2021) 30 tablet 1   No facility-administered medications prior to visit.   ROS See HPI  Objective:  BP (!) 144/90 (BP Location: Left Arm, Patient Position: Sitting, Cuff Size: Large)   Pulse 66   Temp (!) 96.4 F (35.8 C) (Temporal)   Wt 235 lb 3.2 oz (106.7 kg)   SpO2 98%   BMI 39.14 kg/m  Physical Exam Vitals reviewed.  HENT:     Right Ear: Tympanic membrane, ear canal and external ear normal.     Left Ear: Tympanic membrane, ear canal and external ear normal.     Nose:     Right Sinus: Maxillary sinus tenderness present.     Left Sinus: No maxillary sinus tenderness.  Eyes:     Extraocular Movements: Extraocular movements intact.     Conjunctiva/sclera: Conjunctivae normal.  Cardiovascular:     Rate and Rhythm: Normal rate and regular rhythm.     Pulses: Normal pulses.     Heart sounds: Normal heart sounds.  Pulmonary:     Effort: Pulmonary effort is normal.     Breath sounds: Normal breath sounds.  Skin:    General: Skin is warm and dry.   Neurological:     Mental Status: She is alert and oriented to person, place, and time.    Assessment & Plan:  This visit occurred during the SARS-CoV-2 public health emergency.  Safety protocols were in place, including screening questions prior to the visit, additional usage of staff PPE, and extensive cleaning of exam room while observing appropriate contact time as indicated for disinfecting solutions.   Claire Sutton was seen today for follow-up.  Diagnoses and all orders for this visit:  Essential hypertension -     losartan (COZAAR) 50 MG tablet; Take 1 tablet (50 mg total) by mouth daily. -     amLODipine (NORVASC) 5 MG tablet; Take 1 tablet (5 mg total) by mouth daily. -     Basic metabolic panel  Acute non-recurrent maxillary sinusitis -     azithromycin (ZITHROMAX Z-PAK) 250 MG tablet; Take 1 tablet (250 mg total) by mouth daily. Take 2tabs on first day, then 1tab once a day till complete -     guaiFENesin (MUCINEX) 600 MG 12 hr tablet; Take 1 tablet (600 mg total) by mouth 2 (two) times daily as needed for cough or to loosen phlegm.  Dyslipidemia -     Direct LDL  Problem List Items Addressed This Visit       Cardiovascular and Mediastinum   Essential hypertension - Primary    Repeat BP in lying position: 158/82 (L) and 156/84 (R). Reports home BP reading 130s/80s to 150s/90s She thinks elevated BP is related to work stress and back pain. She does maintain DASH diet Limited exercise due to back pain. BP Readings from Last 3 Encounters:  10/17/21 (!) 144/90  08/31/21 (!) 158/90  05/27/21 130/72   Maintain losartan dose at 50mg  in AM Add amlodipine 5mg  in PM. Repeat bmp F/up in 75month      Relevant Medications   losartan (COZAAR) 50 MG tablet   amLODipine (NORVASC) 5 MG tablet   Other Relevant Orders   Basic metabolic panel     Other   Dyslipidemia   Relevant Orders   Direct LDL   Other Visit Diagnoses     Acute non-recurrent maxillary sinusitis        Relevant Medications   azithromycin (ZITHROMAX Z-PAK) 250 MG tablet   guaiFENesin (MUCINEX) 600 MG 12 hr tablet       Follow-up: Return in about 4 weeks (around 11/14/2021) for HTN (video).  Wilfred Lacy, NP

## 2021-10-17 NOTE — Assessment & Plan Note (Addendum)
Repeat BP in lying position: 158/82 (L) and 156/84 (R). Reports home BP reading 130s/80s to 150s/90s She thinks elevated BP is related to work stress and back pain. She does maintain DASH diet Limited exercise due to back pain. BP Readings from Last 3 Encounters:  10/17/21 (!) 144/90  08/31/21 (!) 158/90  05/27/21 130/72   Maintain losartan dose at 50mg  in AM Add amlodipine 5mg  in PM. Repeat bmp F/up in 85month

## 2021-10-18 ENCOUNTER — Ambulatory Visit (INDEPENDENT_AMBULATORY_CARE_PROVIDER_SITE_OTHER): Payer: Managed Care, Other (non HMO) | Admitting: Orthopaedic Surgery

## 2021-10-18 ENCOUNTER — Encounter: Payer: Self-pay | Admitting: Orthopaedic Surgery

## 2021-10-18 ENCOUNTER — Encounter: Payer: Self-pay | Admitting: Nurse Practitioner

## 2021-10-18 VITALS — BP 143/86 | HR 70 | Ht 65.0 in | Wt 235.0 lb

## 2021-10-18 DIAGNOSIS — M5136 Other intervertebral disc degeneration, lumbar region: Secondary | ICD-10-CM

## 2021-10-18 DIAGNOSIS — M48061 Spinal stenosis, lumbar region without neurogenic claudication: Secondary | ICD-10-CM

## 2021-10-18 NOTE — Addendum Note (Signed)
Addended by: Meyer Cory on: 10/18/2021 11:46 AM   Modules accepted: Orders
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
## 2021-10-19 ENCOUNTER — Encounter: Payer: Self-pay | Admitting: Orthopaedic Surgery

## 2021-10-21 ENCOUNTER — Telehealth: Payer: Self-pay | Admitting: Podiatry

## 2021-10-21 NOTE — Telephone Encounter (Signed)
Pt left message stating she got the orthotics a few months ago and they feel fine most of the time but cause pain in heels sometimes.  I returned call and spoke to pt she is scheduled to see Aaron Edelman to see if he can do anything with the orthotics if not he may recommend an appt with Dr Paulla Dolly and we can get her scheduled when she is here as Dr Paulla Dolly is out of the office next week.

## 2021-10-25 ENCOUNTER — Ambulatory Visit: Payer: Managed Care, Other (non HMO)

## 2021-10-25 ENCOUNTER — Other Ambulatory Visit: Payer: Self-pay

## 2021-10-25 DIAGNOSIS — M722 Plantar fascial fibromatosis: Secondary | ICD-10-CM

## 2021-10-25 NOTE — Progress Notes (Signed)
SITUATION Reason for Consult: Follow-up with bilateral foot orthotics Patient / Caregiver Report: Patient reports heel pain and numb toes  OBJECTIVE DATA History / Diagnosis:    ICD-10-CM   1. Plantar fasciitis  M72.2       Change in Pathology: Pain in heels have not subsided, numbness in toes is new  ACTIONS PERFORMED Patient's equipment was checked for structural stability and fit. Added temporary metpads and let patient know that heel modifications would require returning the insoles to the manufacturer. Device(s) intact and fit is excellent. All questions answered and concerns addressed.  PLAN Follow-up as needed (PRN). Plan of care discussed with and agreed upon by patient / caregiver.

## 2021-11-03 ENCOUNTER — Ambulatory Visit (INDEPENDENT_AMBULATORY_CARE_PROVIDER_SITE_OTHER): Payer: Managed Care, Other (non HMO) | Admitting: Podiatry

## 2021-11-03 ENCOUNTER — Other Ambulatory Visit: Payer: Self-pay

## 2021-11-03 ENCOUNTER — Encounter: Payer: Self-pay | Admitting: Podiatry

## 2021-11-03 DIAGNOSIS — M722 Plantar fascial fibromatosis: Secondary | ICD-10-CM

## 2021-11-03 MED ORDER — TRIAMCINOLONE ACETONIDE 10 MG/ML IJ SUSP
10.0000 mg | Freq: Once | INTRAMUSCULAR | Status: AC
Start: 2021-11-03 — End: 2021-11-03
  Administered 2021-11-03: 17:00:00 10 mg

## 2021-11-04 NOTE — Progress Notes (Signed)
Subjective:   Patient ID: Rueben Bash, female   DOB: 60 y.o.   MRN: [id number]   HPI Patient states she has had a reoccurrence of pain in her right plantar heel and states that it is worse when she gets up in the morning and after periods of sitting.  States its been quite sore and making it hard for her to walk   ROS      Objective:  Physical Exam  Neurovascular status intact with inflammation pain of the plantar fascial right at the insertional point of the tendon into the calcaneus with fluid buildup     Assessment:  Acute plantar fasciitis right with inflammation fluid buildup     Plan:  Due to the intense morning discomfort I did dispense night splint with instructions on using it while sleeping.  Due to the intense notes of the medial pain I did do sterile prep and reinjected the fascia at insertion 3 mg Kenalog 5 mg Xylocaine.  Reappoint in 4 weeks or earlier if needed

## 2021-11-08 ENCOUNTER — Ambulatory Visit (INDEPENDENT_AMBULATORY_CARE_PROVIDER_SITE_OTHER): Payer: Managed Care, Other (non HMO) | Admitting: Physical Therapy

## 2021-11-08 ENCOUNTER — Other Ambulatory Visit: Payer: Self-pay

## 2021-11-08 DIAGNOSIS — G8929 Other chronic pain: Secondary | ICD-10-CM | POA: Diagnosis not present

## 2021-11-08 DIAGNOSIS — M6281 Muscle weakness (generalized): Secondary | ICD-10-CM

## 2021-11-08 DIAGNOSIS — M5441 Lumbago with sciatica, right side: Secondary | ICD-10-CM | POA: Diagnosis not present

## 2021-11-08 DIAGNOSIS — R262 Difficulty in walking, not elsewhere classified: Secondary | ICD-10-CM
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
## 2021-11-08 NOTE — Patient Instructions (Signed)
Access Code: [email or web address] URL: [email or web address] Date: 11/08/2021 Prepared by: Kearney Hard  Exercises Hooklying Single Knee to Chest Stretch - 2 x daily - 7 x weekly - 3 reps - 20 seconds hold Supine Lower Trunk Rotation - 2 x daily - 7 x weekly - 3 reps - 20 seconds hold Supine Bridge - 2 x daily - 7 x weekly - 2 sets - 10 reps - 5 seconds hold Supine Piriformis Stretch with Foot on Ground - 2 x daily - 7 x weekly - 3 reps - 20 seconds hold Supine Figure 4 Piriformis Stretch - 2 x daily - 7 x weekly - 2 reps - 20 seconds hold Seated Hamstring Stretch - 2 x daily - 7 x weekly - 3 reps - 20 seconds hold

## 2021-11-15 ENCOUNTER — Other Ambulatory Visit: Payer: Self-pay

## 2021-11-15 ENCOUNTER — Ambulatory Visit (INDEPENDENT_AMBULATORY_CARE_PROVIDER_SITE_OTHER): Payer: Managed Care, Other (non HMO) | Admitting: Physical Therapy

## 2021-11-15 ENCOUNTER — Encounter: Payer: Self-pay | Admitting: Physical Therapy

## 2021-11-15 DIAGNOSIS — M6281 Muscle weakness (generalized): Secondary | ICD-10-CM | POA: Diagnosis not present

## 2021-11-15 DIAGNOSIS — R262 Difficulty in walking, not elsewhere classified: Secondary | ICD-10-CM | POA: Diagnosis not present

## 2021-11-15 DIAGNOSIS — M5441 Lumbago with sciatica, right side: Secondary | ICD-10-CM | POA: Diagnosis not present

## 2021-11-15 DIAGNOSIS — G8929 Other chronic pain: Secondary | ICD-10-CM

## 2021-11-15 NOTE — Therapy (Signed)
[facility] [address] [place], [place], [address] Phone: [phone]   Fax:  [phone]  Physical Therapy Treatment  Patient Details  Name: Claire Sutton MRN: [id number] Date of Birth: 20-Apr-1961 Referring Provider (PT): Rodell Perna MD   Encounter Date: 11/15/2021   PT End of Session - 11/15/21 1433     Visit Number 2    Number of Visits 12    Date for PT Re-Evaluation 01/06/22    PT Start Time 1430    PT Stop Time 1508    PT Time Calculation (min) 38 min    Activity Tolerance Patient tolerated treatment well    Behavior During Therapy [facility] for tasks assessed/performed             Past Medical History:  Diagnosis Date   Allergy    ASCUS (atypical squamous cells of undetermined significance) on Pap smear 07/2006   NEG HIGH RISK HPV-- THEN IN 10/2006 AND 07/2007 PAPS WITH BENIGN REACTIVE CHANGES   Asthma    GERD (gastroesophageal reflux disease)    Headache    History of blood clot in brain    a. Fall age 83 - was told she had a blood clot in the brain from this.   Hyperlipidemia    Hypertension    LGSIL (low grade squamous intraepithelial dysplasia) 10/05   PAP ON 02/2005, 7,8,9,10,11,12WNL   Obesity    Seizures ([place])    a. began after she had a fall at age 61, last seizure age 61.    Past Surgical History:  Procedure Laterality Date   COLPOSCOPY     ENDOMETRIAL ABLATION  02/2007   NOVASURE/MARSUPILIZATION OF BARTHOLIN   MARSUPILIZATION OF BARTHOLIN  02/2007   AT TIME OF NOVASURE ABLATION SURGERY   TUBAL LIGATION      There were no vitals filed for this visit.   Subjective Assessment - 11/15/21 1430     Subjective Pt reporting no pain today. Pt stating since doing the exercises and limiting her lifting her back is much better.    Limitations House hold activities;Walking;Standing;Lifting    Patient Stated Goals Scan: mild narrow, L5-S1 it's moderate on the left, mild neuroforaminal narrowing instability  with no  subluxation in minimla facel changes    Currently in Pain? No/denies                               [facility] Adult PT Treatment/Exercise - 11/15/21 0001       Exercises   Exercises Lumbar      Lumbar Exercises: Stretches   Active Hamstring Stretch Right;Left;1 rep;20 seconds    Active Hamstring Stretch Limitations seated    Single Knee to Chest Stretch 3 reps;Left;Right;20 seconds    Lower Trunk Rotation 3 reps;10 seconds    Prone on Elbows Stretch 3 reps;10 seconds    Piriformis Stretch 20 seconds;3 reps    Piriformis Stretch Limitations bilateral LE    Figure 4 Stretch 3 reps;30 seconds    Figure 4 Stretch Limitations bilateral LE's      Lumbar Exercises: Aerobic   Recumbent Bike L2 x 5 minutes      Lumbar Exercises: Seated   Other Seated Lumbar Exercises seated while performing flexion over green physioball x 10 holding 3-5 seconds, rolling green ball from side to side while keep abdominal tight      Lumbar Exercises: Supine   Bridge 10 reps;3 seconds  Straight Leg Raise 10 reps;Limitations    Straight Leg Raises Limitations with core activation      Lumbar Exercises: Sidelying   Hip Abduction Both;15 reps                       PT Short Term Goals - 11/15/21 1436       PT SHORT TERM GOAL #1   Title Pt will be independent in her initial HEP.    Status On-going      PT SHORT TERM GOAL #2   Title improve sitt to stand </= 15 seconds with no UE support    Status On-going               PT Long Term Goals - 11/08/21 1351       PT LONG TERM GOAL #1   Title Pt will be independent in her advanced HEP.    Time 6    Period Weeks    Status New    Target Date 12/23/21      PT LONG TERM GOAL #2   Title Pt will be able to perform trunk rotation with no pain in her low back.    Time 6    Period Weeks    Status New    Target Date 12/23/21      PT LONG TERM GOAL #3   Title Pt will be able to lift 10# from floor to counter  height and then onto overhead shelf with pain </= 2/10.    Time 6    Period Weeks    Status New    Target Date 12/23/21      PT LONG TERM GOAL #4   Title Pt will improve her FOTO from 50% to 60%.    Time 6    Period Weeks    Status New    Target Date 12/23/21      PT LONG TERM GOAL #5   Title Pt will report ability to climb 1 flight of stairs with pain </= 2/10 with single handrail with step over step pattern.    Time 6    Period Weeks    Status New    Target Date 12/23/21                   Plan - 11/15/21 1433     Clinical Impression Statement Pt tolerating exercises well today. Reporting less pain since beginning her HEP and limiting her lifting at work. Treatment focused on stretching and core stabilization exercises. Continue skilled PT to maximize function.    Personal Factors and Comorbidities Comorbidity 3+    Comorbidities seizures, HTN, asthma, blood clot in brain age 61, ASCUS, GERD, obesity    Examination-Activity Limitations Lift;Stand;Stairs;Squat;Other    Examination-Participation Restrictions Community Activity;Occupation;Other    Stability/Clinical Decision Making Stable/Uncomplicated    Rehab Potential Good    PT Frequency 2x / week    PT Duration 6 weeks    PT Treatment/Interventions ADLs/Self Care Home Management;Cryotherapy;Electrical Stimulation;Gait training;Stair training;Functional mobility training;Therapeutic activities;Therapeutic exercise;Balance training;Moist Heat;Traction;Ultrasound;Neuromuscular re-education;Patient/family education;Passive range of motion;Manual techniques;Dry needling;Spinal Manipulations;Taping    PT Next Visit Plan Nustep, lumbar stretrching, core strenghtening, LE strengtheing, lumbar mobs    PT Home Exercise Plan Access Code: [email or web address]  URL: [email or web address]  Date: 11/08/2021  Prepared by: Kearney Hard    Exercises  Hooklying Single Knee to Chest Stretch - 2 x daily - 7 x weekly - 3 reps - 20  seconds hold  Supine Lower Trunk Rotation - 2 x daily - 7 x weekly - 3 reps - 20 seconds hold  Supine Bridge - 2 x daily - 7 x weekly - 2 sets - 10 reps - 5 seconds hold  Supine Piriformis Stretch with Foot on Ground - 2 x daily - 7 x weekly - 3 reps - 20 seconds hold  Supine Figure 4 Piriformis Stretch - 2 x daily - 7 x weekly - 2 reps - 20 seconds hold  Seated Hamstring Stretch - 2 x daily - 7 x weekly - 3 reps - 20 seconds hold    Consulted and Agree with Plan of Care Patient             Patient will benefit from skilled therapeutic intervention in order to improve the following deficits and impairments:  Pain, Postural dysfunction, Decreased strength, Decreased mobility, Decreased balance, Impaired flexibility, Decreased activity tolerance, Obesity, Difficulty walking, Decreased range of motion  Visit Diagnosis: Chronic bilateral low back pain with right-sided sciatica  Difficulty in walking, not elsewhere classified  Muscle weakness (generalized)     Problem List Patient Active Problem List   Diagnosis Date Noted   Neural foraminal stenosis of lumbar spine 09/21/2021   DDD (degenerative disc disease), lumbar 09/21/2021   Numbness of left foot 09/21/2021   Chronic bilateral low back pain with bilateral sciatica 08/31/2021   Postmenopausal estrogen deficiency 01/22/2019   History of colon polyps 06/13/2018   Hip pain, chronic 03/07/2018   Asthma 03/14/2017   Abnormal EKG 10/18/2016   Dyslipidemia 10/18/2016   Essential hypertension 09/15/2016   Obesity    Gastroesophageal reflux disease without esophagitis 09/07/2016   Acute bronchitis 09/07/2016   Intermediate uveitis of left eye 06/16/2014   Scleritis and episcleritis of left eye 06/16/2014    Oretha Caprice, PT, MPT 11/15/2021, 2:59 PM  [facility] [address] [place], [place], [address] Phone: [phone]   Fax:  [phone]  Name: Claire Sutton MRN: [id number] Date  of Birth: 1961/06/17

## 2021-11-16 ENCOUNTER — Other Ambulatory Visit: Payer: Self-pay | Admitting: Nurse Practitioner

## 2021-11-16 ENCOUNTER — Ambulatory Visit
Admission: RE | Admit: 2021-11-16 | Discharge: 2021-11-16 | Disposition: A | Discharge status: Home / Self Care | Payer: Managed Care, Other (non HMO) | Source: Ambulatory Visit | Attending: Nurse Practitioner | Admitting: Nurse Practitioner

## 2021-11-16 DIAGNOSIS — R928 Other abnormal and inconclusive findings on diagnostic imaging of breast: Secondary | ICD-10-CM

## 2021-11-16 DIAGNOSIS — N6002 Solitary cyst of left breast: Secondary | ICD-10-CM

## 2021-11-17 ENCOUNTER — Encounter: Payer: Self-pay | Admitting: Rehabilitative and Restorative Service Providers"

## 2021-11-17 ENCOUNTER — Ambulatory Visit (INDEPENDENT_AMBULATORY_CARE_PROVIDER_SITE_OTHER): Payer: Managed Care, Other (non HMO) | Admitting: Rehabilitative and Restorative Service Providers"

## 2021-11-17 ENCOUNTER — Other Ambulatory Visit: Payer: Self-pay

## 2021-11-17 DIAGNOSIS — M5441 Lumbago with sciatica, right side: Secondary | ICD-10-CM

## 2021-11-17 DIAGNOSIS — R262 Difficulty in walking, not elsewhere classified: Secondary | ICD-10-CM

## 2021-11-17 DIAGNOSIS — G8929 Other chronic pain: Secondary | ICD-10-CM | POA: Diagnosis not present

## 2021-11-17 DIAGNOSIS — M6281 Muscle weakness (generalized): Secondary | ICD-10-CM | POA: Diagnosis not present

## 2021-11-17 NOTE — Therapy (Signed)
[facility] Physical Therapy [address] [place], [place], [address] Phone: [phone]   Fax:  [phone]  Physical Therapy Treatment  Patient Details  Name: Claire Sutton MRN: [id number] Date of Birth: [birth year] Referring Provider (PT): Rodell Perna MD   Encounter Date: 11/17/2021   PT End of Session - 11/17/21 0848     Visit Number 3    Number of Visits 12    Date for PT Re-Evaluation 01/06/22    PT Start Time 0844    PT Stop Time 0923    PT Time Calculation (min) 39 min    Activity Tolerance Patient tolerated treatment well    Behavior During Therapy [facility] for tasks assessed/performed             Past Medical History:  Diagnosis Date   Allergy    ASCUS (atypical squamous cells of undetermined significance) on Pap smear 07/2006   NEG HIGH RISK HPV-- THEN IN 10/2006 AND 07/2007 PAPS WITH BENIGN REACTIVE CHANGES   Asthma    GERD (gastroesophageal reflux disease)    Headache    History of blood clot in brain    a. Fall age 33 - was told she had a blood clot in the brain from this.   Hyperlipidemia    Hypertension    LGSIL (low grade squamous intraepithelial dysplasia) 10/05   PAP ON 02/2005, 7,8,9,10,11,12WNL   Obesity    Seizures ([place])    a. began after she had a fall at age 62, last seizure age 15.    Past Surgical History:  Procedure Laterality Date   COLPOSCOPY     ENDOMETRIAL ABLATION  02/2007   NOVASURE/MARSUPILIZATION OF BARTHOLIN   MARSUPILIZATION OF BARTHOLIN  02/2007   AT TIME OF NOVASURE ABLATION SURGERY   TUBAL LIGATION      There were no vitals filed for this visit.   Subjective Assessment - 11/17/21 0850     Subjective Pt. indicated indicated having 2/10 pain this morning but did feel improvement c use of HEP.  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    Limitations House hold activities;Walking;Standing;Lifting    Patient Stated Goals Scan: mild narrow, L5-S1  it's moderate on the left, mild neuroforaminal narrowing instability  with no subluxation in minimla facel changes    Currently in Pain? No/denies    Pain Score 0-No pain                [facility] PT Assessment - 11/17/21 0001       Assessment   Medical Diagnosis M48.061 neural forminal stenosis lumbar spine, M51.36 DDD lumbar    Referring Provider (PT) Rodell Perna MD      AROM   Lumbar Extension 75 % WFL s mild end range tightness reported in standing                           [facility] Adult PT Treatment/Exercise - 11/17/21 0001       Exercises   Exercises Other Exercises    Other Exercises  reviewed sitting versions of exercises for use during day at work      Lumbar Exercises: Stretches   Single Knee to Chest Stretch 2 reps;Left;Right;20 seconds   bilateral in sitting   Piriformis Stretch 2 reps;Right;Left;20 seconds   bilateral in sitting, pulling towards chest   Figure 4 Stretch With overpressure;20 seconds;2 reps   bilateral  in sitting     Lumbar Exercises: Aerobic   Nustep Lvl 6 10 mins      Lumbar Exercises: Standing   Other Standing Lumbar Exercises standing lumbar extension x 5      Lumbar Exercises: Seated   Sit to Stand Other (comment);15 reps   18 inch chair s UE assist   Other Seated Lumbar Exercises reviewed sitting flexion onto table to replicate ball stretch    Other Seated Lumbar Exercises seated scapular retraction c chest lift 2-3 second hold x 10                       PT Short Term Goals - 11/17/21 0916       PT SHORT TERM GOAL #1   Title Pt will be independent in her initial HEP.    Status Achieved      PT SHORT TERM GOAL #2   Title improve sit to stand </= 15 seconds with no UE support    Status On-going               PT Long Term Goals - 11/08/21 1351       PT LONG TERM GOAL #1   Title Pt will be independent in her advanced HEP.    Time 6    Period Weeks    Status New    Target Date 12/23/21       PT LONG TERM GOAL #2   Title Pt will be able to perform trunk rotation with no pain in her low back.    Time 6    Period Weeks    Status New    Target Date 12/23/21      PT LONG TERM GOAL #3   Title Pt will be able to lift 10# from floor to counter height and then onto overhead shelf with pain </= 2/10.    Time 6    Period Weeks    Status New    Target Date 12/23/21      PT LONG TERM GOAL #4   Title Pt will improve her FOTO from 50% to 60%.    Time 6    Period Weeks    Status New    Target Date 12/23/21      PT LONG TERM GOAL #5   Title Pt will report ability to climb 1 flight of stairs with pain </= 2/10 with single handrail with step over step pattern.    Time 6    Period Weeks    Status New    Target Date 12/23/21                   Plan - 11/17/21 0915     Clinical Impression Statement Pt. has demonstrated good reported improvement with use of HEP at this time.  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  Continued skilled PT services indicated at this time to continue to progress towards goals.    Personal Factors and Comorbidities Comorbidity 3+    Comorbidities seizures, HTN, asthma, blood clot in brain age 24, ASCUS, GERD, obesity    Examination-Activity Limitations Lift;Stand;Stairs;Squat;Other    Examination-Participation Restrictions Community Activity;Occupation;Other    Stability/Clinical Decision Making Stable/Uncomplicated    Rehab Potential Good    PT Frequency 2x / week    PT Duration 6 weeks    PT Treatment/Interventions ADLs/Self  Care Home Management;Cryotherapy;Electrical Stimulation;Gait training;Stair training;Functional mobility training;Therapeutic activities;Therapeutic exercise;Balance training;Moist Heat;Traction;Ultrasound;Neuromuscular re-education;Patient/family education;Passive range of motion;Manual techniques;Dry needling;Spinal  Manipulations;Taping    PT Next Visit Plan FOTO reassessment ( last visit scheduled at this time)/LTG assessment    PT Home Exercise Plan Access Code: [email or web address]  URL: [email or web address]  Date: 11/08/2021  Prepared by: Kearney Hard    Exercises  Hooklying Single Knee to Chest Stretch - 2 x daily - 7 x weekly - 3 reps - 20 seconds hold  Supine Lower Trunk Rotation - 2 x daily - 7 x weekly - 3 reps - 20 seconds hold  Supine Bridge - 2 x daily - 7 x weekly - 2 sets - 10 reps - 5 seconds hold  Supine Piriformis Stretch with Foot on Ground - 2 x daily - 7 x weekly - 3 reps - 20 seconds hold  Supine Figure 4 Piriformis Stretch - 2 x daily - 7 x weekly - 2 reps - 20 seconds hold  Seated Hamstring Stretch - 2 x daily - 7 x weekly - 3 reps - 20 seconds hold    Consulted and Agree with Plan of Care Patient             Patient will benefit from skilled therapeutic intervention in order to improve the following deficits and impairments:  Pain, Postural dysfunction, Decreased strength, Decreased mobility, Decreased balance, Impaired flexibility, Decreased activity tolerance, Obesity, Difficulty walking, Decreased range of motion  Visit Diagnosis: Chronic bilateral low back pain with right-sided sciatica  Difficulty in walking, not elsewhere classified  Muscle weakness (generalized)     Problem List Patient Active Problem List   Diagnosis Date Noted   Neural foraminal stenosis of lumbar spine 09/21/2021   DDD (degenerative disc disease), lumbar 09/21/2021   Numbness of left foot 09/21/2021   Chronic bilateral low back pain with bilateral sciatica 08/31/2021   Postmenopausal estrogen deficiency 01/22/2019   History of colon polyps 06/13/2018   Hip pain, chronic 03/07/2018   Asthma 03/14/2017   Abnormal EKG 10/18/2016   Dyslipidemia 10/18/2016   Essential hypertension 09/15/2016   Obesity    Gastroesophageal reflux disease without esophagitis 09/07/2016   Acute bronchitis  09/07/2016   Intermediate uveitis of left eye 06/16/2014   Scleritis and episcleritis of left eye 06/16/2014   Scot Jun, PT, DPT, OCS, ATC 11/17/21  9:22 AM    [facility] [address] [place], [place], [address] Phone: [phone]   Fax:  [phone]  Name: Claire Sutton MRN: [id number] Date of Birth: [birth year]

## 2021-11-28 ENCOUNTER — Encounter: Payer: Managed Care, Other (non HMO) | Admitting: Physical Therapy

## 2021-12-01 ENCOUNTER — Ambulatory Visit: Payer: Managed Care, Other (non HMO) | Admitting: Nurse Practitioner

## 2021-12-05 ENCOUNTER — Telehealth: Payer: Self-pay | Admitting: Podiatry

## 2021-12-05 NOTE — Telephone Encounter (Signed)
Adjusted orthotics in... lvm for pt to call to schedule an appt to pick them up.

## 2021-12-14 ENCOUNTER — Ambulatory Visit: Payer: Managed Care, Other (non HMO)

## 2021-12-14 ENCOUNTER — Other Ambulatory Visit: Payer: Self-pay

## 2021-12-14 DIAGNOSIS — M722 Plantar fascial fibromatosis: Secondary | ICD-10-CM

## 2021-12-14 NOTE — Progress Notes (Signed)
SITUATION Reason for Consult: Follow-up with custom foot orthotics Patient / Caregiver Report: Patient feels much better with additional support and cushion  OBJECTIVE DATA History / Diagnosis:    ICD-10-CM   1. Plantar fasciitis  M72.2       Change in Pathology: None  ACTIONS PERFORMED Patient's equipment was checked for structural stability and fit. Modified devices fit excellently. Device(s) intact and fit is excellent. All questions answered and concerns addressed.  PLAN Follow-up as needed (PRN). Plan of care discussed with and agreed upon by patient / caregiver.

## 2021-12-20 ENCOUNTER — Encounter: Payer: Self-pay | Admitting: Family Medicine

## 2021-12-20 ENCOUNTER — Telehealth (INDEPENDENT_AMBULATORY_CARE_PROVIDER_SITE_OTHER): Payer: Managed Care, Other (non HMO) | Admitting: Family Medicine

## 2021-12-20 VITALS — Temp 100.6°F

## 2021-12-20 DIAGNOSIS — B349 Viral infection, unspecified: Secondary | ICD-10-CM | POA: Diagnosis not present

## 2021-12-20 DIAGNOSIS — J4521 Mild intermittent asthma with (acute) exacerbation: Secondary | ICD-10-CM | POA: Diagnosis not present

## 2021-12-20 MED ORDER — BENZONATATE 200 MG PO CAPS: 200.0000 mg | ORAL_CAPSULE | Freq: Two times a day (BID) | ORAL | 0 refills | Status: DC | PRN

## 2021-12-20 MED ORDER — PREDNISONE 10 MG PO TABS: 10.0000 mg | ORAL_TABLET | Freq: Two times a day (BID) | ORAL | 0 refills | Status: AC

## 2021-12-20 NOTE — Progress Notes (Signed)
Established Patient Office Visit  Subjective:  Patient ID: Claire Sutton, female    DOB: 1961-08-16  Age: 61 y.o. MRN: [id number]  CC:  Chief Complaint  Patient presents with   Cough    Cough, lots of nasal drainage sinus issues symptoms x 4 days. Fever started yesterday. No COVID test.     HPI [name] presents for evaluation and treatment of a 5-day history of URI signs and symptoms with elevated temperature, frontal headache, nasal congestion, clear rhinorrhea, postnasal drip, cough with wheeze.  Denies myalgias or arthralgias.  Denies nausea or vomiting.  History of reactive airway disease.  She does not smoke.  History of ongoing allergy rhinitis.  History of hypertension.  She has tried Tylenol cold and flu 1 time.  She has also taken ibuprofen.  Past Medical History:  Diagnosis Date   Allergy    ASCUS (atypical squamous cells of undetermined significance) on Pap smear 07/2006   NEG HIGH RISK HPV-- THEN IN 10/2006 AND 07/2007 PAPS WITH BENIGN REACTIVE CHANGES   Asthma    GERD (gastroesophageal reflux disease)    Headache    History of blood clot in brain    a. Fall age 67 - was told she had a blood clot in the brain from this.   Hyperlipidemia    Hypertension    LGSIL (low grade squamous intraepithelial dysplasia) 10/05   PAP ON 02/2005, 7,8,9,10,11,12WNL   Obesity    Seizures ([place])    a. began after she had a fall at age 69, last seizure age 55.    Past Surgical History:  Procedure Laterality Date   COLPOSCOPY     ENDOMETRIAL ABLATION  02/2007   NOVASURE/MARSUPILIZATION OF BARTHOLIN   MARSUPILIZATION OF BARTHOLIN  02/2007   AT TIME OF NOVASURE ABLATION SURGERY   TUBAL LIGATION      Family History  Problem Relation Age of Onset   Hypertension Mother    Hyperlipidemia Mother    Heart disease Mother        was told she may have had a few MIs that she was unaware of   Diabetes Father    Hypertension Sister    Diabetes Paternal Grandfather    Colon  cancer Neg Hx    Esophageal cancer Neg Hx    Rectal cancer Neg Hx    Stomach cancer Neg Hx     Social History   Socioeconomic History   Marital status: Single    Spouse name: Not on file   Number of children: Not on file   Years of education: Not on file   Highest education level: Not on file  Occupational History   Not on file  Tobacco Use   Smoking status: Never   Smokeless tobacco: Never  Vaping Use   Vaping Use: Never used  Substance and Sexual Activity   Alcohol use: No    Alcohol/week: 0.0 standard drinks   Drug use: No   Sexual activity: Not Currently    Birth control/protection: Post-menopausal    Comment: 1st intercourse 61 yo-More than 5 partners  Other Topics Concern   Not on file  Social History Narrative   Not on file   Social Determinants of Health   Financial Resource Strain: Not on file  Food Insecurity: Not on file  Transportation Needs: Not on file  Physical Activity: Not on file  Stress: Not on file  Social Connections: Not on file  Intimate Partner Violence: Not on file  Outpatient Medications Prior to Visit  Medication Sig Dispense Refill   albuterol (PROAIR HFA) 108 (90 Base) MCG/ACT inhaler Inhale 1-2 puffs into the lungs every 6 (six) hours as needed for wheezing or shortness of breath. 6.7 g 1   amLODipine (NORVASC) 5 MG tablet Take 1 tablet (5 mg total) by mouth daily. 90 tablet 3   atorvastatin (LIPITOR) 20 MG tablet Take 1 tablet (20 mg total) by mouth daily. 90 tablet 3   Cetirizine HCl (ZYRTEC PO) Take 1 tablet by mouth daily.      Cholecalciferol (VITAMIN D PO) Take 1 tablet by mouth daily.      Coenzyme Q10 (CO Q 10 PO) Take by mouth.     Cyanocobalamin (B-12) 1000 MCG CAPS Take by mouth.     EPINEPHrine 0.3 mg/0.3 mL IJ SOAJ injection Inject 0.3 mg into the muscle as needed for anaphylaxis. 1 each 1   ibuprofen (ADVIL) 600 MG tablet TAKE 1 TABLET(600 MG) BY MOUTH EVERY 8 HOURS WITH FOOD AS NEEDED 30 tablet 2   losartan (COZAAR)  50 MG tablet Take 1 tablet (50 mg total) by mouth daily. 90 tablet 3   mometasone (NASONEX) 50 MCG/ACT nasal spray Place 2 sprays into the nose daily. 1 each 12   montelukast (SINGULAIR) 10 MG tablet Take 1 tablet (10 mg total) by mouth at bedtime. 90 tablet 3   Multiple Vitamins-Calcium (ONE-A-DAY WOMENS PO) Take 1 tablet by mouth daily.      omeprazole (PRILOSEC) 20 MG capsule Take 1 capsule (20 mg total) by mouth daily. 90 capsule 3   Zinc 50 MG CAPS Take by mouth.     azithromycin (ZITHROMAX Z-PAK) 250 MG tablet Take 1 tablet (250 mg total) by mouth daily. Take 2tabs on first day, then 1tab once a day till complete 6 tablet 0   Diclofenac Sodium (PENNSAID) 2 % SOLN Apply 1 inch topically 2 (two) times daily. (Patient not taking: Reported on 12/20/2021) 112 g 1   guaiFENesin (MUCINEX) 600 MG 12 hr tablet Take 1 tablet (600 mg total) by mouth 2 (two) times daily as needed for cough or to loosen phlegm. (Patient not taking: Reported on 12/20/2021) 14 tablet 0   No facility-administered medications prior to visit.    Allergies  Allergen Reactions   Codeine     COLD SWEAT, LIGHT-HEADEDNESS. Cold sweats   Cyclobenzaprine Rash    ROS Review of Systems  Constitutional:  Positive for fever. Negative for chills, diaphoresis, fatigue and unexpected weight change.  HENT:  Positive for congestion, postnasal drip, sinus pressure and voice change. Negative for trouble swallowing.   Eyes:  Negative for photophobia and visual disturbance.  Respiratory:  Positive for choking and wheezing. Negative for shortness of breath.   Cardiovascular: Negative.   Gastrointestinal: Negative.  Negative for nausea and vomiting.  Musculoskeletal:  Negative for arthralgias and myalgias.  Neurological:  Positive for headaches. Negative for speech difficulty and weakness.     Objective:    Physical Exam Vitals and nursing note reviewed.  Constitutional:      General: She is not in acute distress.    Appearance:  Normal appearance. She is not ill-appearing, toxic-appearing or diaphoretic.  HENT:     Head: Normocephalic and atraumatic.  Eyes:     General: No scleral icterus.       Right eye: No discharge.        Left eye: No discharge.     Extraocular Movements: Extraocular movements intact.  Conjunctiva/sclera: Conjunctivae normal.  Pulmonary:     Effort: Pulmonary effort is normal.  Neurological:     Mental Status: She is alert and oriented to person, place, and time.  Psychiatric:        Mood and Affect: Mood normal.        Behavior: Behavior normal.    Temp (!) 100.6 F (38.1 C) (Temporal)  Wt Readings from Last 3 Encounters:  10/18/21 235 lb (106.6 kg)  10/17/21 235 lb 3.2 oz (106.7 kg)  08/31/21 236 lb 6.4 oz (107.2 kg)     Health Maintenance Due  Topic Date Due   Zoster Vaccines- Shingrix (1 of 2) Never done   PAP SMEAR-Modifier  01/26/2022    There are no preventive care reminders to display for this patient.  Lab Results  Component Value Date   TSH 1.41 01/10/2021   Lab Results  Component Value Date   WBC 3.6 (L) 01/10/2021   HGB 14.1 01/10/2021   HCT 42.1 01/10/2021   MCV 85.5 01/10/2021   PLT 214.0 01/10/2021   Lab Results  Component Value Date   NA 141 10/17/2021   K 4.0 10/17/2021   CO2 25 10/17/2021   GLUCOSE 88 10/17/2021   BUN 15 10/17/2021   CREATININE 0.85 10/17/2021   BILITOT 0.8 01/10/2021   ALKPHOS 76 01/10/2021   AST 19 01/10/2021   ALT 19 01/10/2021   PROT 7.0 01/10/2021   ALBUMIN 3.9 01/10/2021   CALCIUM 9.3 10/17/2021   GFR 73.79 10/17/2021   Lab Results  Component Value Date   CHOL 221 (H) 01/10/2021   Lab Results  Component Value Date   HDL 60.50 01/10/2021   Lab Results  Component Value Date   LDLCALC 147 (H) 01/10/2021   Lab Results  Component Value Date   TRIG 67.0 01/10/2021   Lab Results  Component Value Date   CHOLHDL 4 01/10/2021   No results found for: HGBA1C    Assessment & Plan:   Problem List Items  Addressed This Visit       Respiratory   Reactive airway disease   Relevant Medications   predniSONE (DELTASONE) 10 MG tablet     Other   Viral syndrome - Primary   Relevant Medications   benzonatate (TESSALON) 200 MG capsule   Other Relevant Orders   COVID-19, Flu A+B and RSV    Meds ordered this encounter  Medications   predniSONE (DELTASONE) 10 MG tablet    Sig: Take 1 tablet (10 mg total) by mouth 2 (two) times daily with a meal for 5 days.    Dispense:  10 tablet    Refill:  0   benzonatate (TESSALON) 200 MG capsule    Sig: Take 1 capsule (200 mg total) by mouth 2 (two) times daily as needed for cough.    Dispense:  20 capsule    Refill:  0    Follow-up: No follow-ups on file.  We will follow-up today for above testing.  We will start lower prednisone dose to help with wheezing and cough.  She has follow-up scheduled with Baldo Ash on Thursday.  She will be out of her quarantine.  At that time.  Libby Maw, MD  Virtual Visit via Video Note  I connected with Rueben Bash on 12/20/21 at 10:30 AM EST by a video enabled telemedicine application and verified that I am speaking with the correct person using two identifiers.  Location: Patient: home alone.  Provider: work  I discussed the limitations of evaluation and management by telemedicine and the availability of in person appointments. The patient expressed understanding and agreed to proceed.  History of Present Illness:    Observations/Objective:   Assessment and Plan:   Follow Up Instructions:    I discussed the assessment and treatment plan with the patient. The patient was provided an opportunity to ask questions and all were answered. The patient agreed with the plan and demonstrated an understanding of the instructions.   The patient was advised to call back or seek an in-person evaluation if the symptoms worsen or if the condition fails to improve as anticipated.  I provided 20  minutes of non-face-to-face time during this encounter.   Libby Maw, MD

## 2021-12-21 LAB — COVID-19, FLU A+B AND RSV
Influenza A, NAA: NOT DETECTED
Influenza B, NAA: NOT DETECTED
RSV, NAA: DETECTED — AB
SARS-CoV-2, NAA: NOT DETECTED

## 2021-12-29 ENCOUNTER — Ambulatory Visit: Payer: Managed Care, Other (non HMO) | Admitting: Nurse Practitioner

## 2021-12-29 ENCOUNTER — Encounter: Payer: Self-pay | Admitting: Nurse Practitioner

## 2021-12-29 ENCOUNTER — Other Ambulatory Visit: Payer: Self-pay

## 2021-12-29 VITALS — BP 130/89 | HR 60 | Temp 97.0°F | Wt 235.0 lb

## 2021-12-29 DIAGNOSIS — M5442 Lumbago with sciatica, left side: Secondary | ICD-10-CM

## 2021-12-29 DIAGNOSIS — I10 Essential (primary) hypertension: Secondary | ICD-10-CM

## 2021-12-29 DIAGNOSIS — G8929 Other chronic pain: Secondary | ICD-10-CM

## 2021-12-29 DIAGNOSIS — J301 Allergic rhinitis due to pollen: Secondary | ICD-10-CM | POA: Diagnosis not present

## 2021-12-29 DIAGNOSIS — M5441 Lumbago with sciatica, right side: Secondary | ICD-10-CM | POA: Diagnosis not present

## 2021-12-29 DIAGNOSIS — J302 Other seasonal allergic rhinitis: Secondary | ICD-10-CM | POA: Insufficient documentation

## 2021-12-29 MED ORDER — LEVOCETIRIZINE DIHYDROCHLORIDE 5 MG PO TABS: 5.0000 mg | ORAL_TABLET | Freq: Every evening | ORAL | 3 refills | Status: DC

## 2021-12-29 MED ORDER — AMLODIPINE BESYLATE 5 MG PO TABS: 5.0000 mg | ORAL_TABLET | Freq: Every day | ORAL | 3 refills | Status: DC

## 2021-12-29 MED ORDER — AZELASTINE HCL 0.1 % NA SOLN: 1.0000 | Freq: Two times a day (BID) | NASAL | 5 refills | Status: DC

## 2021-12-29 NOTE — Assessment & Plan Note (Addendum)
Secondary to neual foraminal stenosis and DDD of lumbar spine Improved back pain with PT Persistent numbness in left foot and right foot pain. She quit [employer] job. Works from home at this time. Had eval by Dr. Lorin Mercy: only PT and weight loss recommended

## 2021-12-29 NOTE — Patient Instructions (Signed)
Maintain current BP medications  Stop zyrtec Start xyzal Stop nasonex Start azelastine nasal spray Use saline nasal spray in between to moisturize nasal passages If no improvement in 1-2week, Call office.

## 2021-12-29 NOTE — Progress Notes (Signed)
Subjective:  Patient ID: Claire Sutton, female    DOB: July 30, 1961  Age: 62 y.o. MRN: [id number]  CC: Follow-up (4 week f/u on HTN. Pt states blood pressure is going well. Pt is out of 5 mg amlodipine. )   HPI Chronic bilateral low back pain with bilateral sciatica Secondary to neual foraminal stenosis and DDD of lumbar spine Improved back pain with PT Persistent numbness in left foot and right foot pain. She quit [employer] job. Works from home at this time. Had eval by Dr. Lorin Sutton: only PT and weight loss recommended  Essential hypertension Improved with losartan and amlodipine BP Readings from Last 3 Encounters:  12/29/21 130/89  10/18/21 (!) 143/86  10/17/21 (!) 144/90   Maintain med doses Refill sent  Seasonal allergic rhinitis due to pollen Persistent nasal congestion, cough x 31months Also has intermittent nose bleed Some improvement with oral prednisone dose pack No improvement with albuterol. Current daily use of nasonex, zyrtec, and montelukast. Last allergy test: dust mite, cockrach, milk , peanuts and soy.  Stop zyrtec, Start xyzal Stop nasonex, Start azelastine nasal spray Use saline nasal spray in between to moisturize nasal passages If no improvement in 1-2week, Call office. Send oral abx, oral prednisone and ref to allergist if no improvement   Reviewed past Medical, Social and Family history today.  Outpatient Medications Prior to Visit  Medication Sig Dispense Refill   albuterol (PROAIR HFA) 108 (90 Base) MCG/ACT inhaler Inhale 1-2 puffs into the lungs every 6 (six) hours as needed for wheezing or shortness of breath. 6.7 g 1   atorvastatin (LIPITOR) 20 MG tablet Take 1 tablet (20 mg total) by mouth daily. 90 tablet 3   benzonatate (TESSALON) 200 MG capsule Take 1 capsule (200 mg total) by mouth 2 (two) times daily as needed for cough. 20 capsule 0   Cholecalciferol (VITAMIN D PO) Take 1 tablet by mouth daily.      Coenzyme Q10 (CO Q 10 PO) Take by mouth.      Cyanocobalamin (B-12) 1000 MCG CAPS Take by mouth.     Diclofenac Sodium (PENNSAID) 2 % SOLN Apply 1 inch topically 2 (two) times daily. 112 g 1   EPINEPHrine 0.3 mg/0.3 mL IJ SOAJ injection Inject 0.3 mg into the muscle as needed for anaphylaxis. 1 each 1   ibuprofen (ADVIL) 600 MG tablet TAKE 1 TABLET(600 MG) BY MOUTH EVERY 8 HOURS WITH FOOD AS NEEDED 30 tablet 2   losartan (COZAAR) 50 MG tablet Take 1 tablet (50 mg total) by mouth daily. 90 tablet 3   mometasone (NASONEX) 50 MCG/ACT nasal spray Place 2 sprays into the nose daily. 1 each 12   montelukast (SINGULAIR) 10 MG tablet Take 1 tablet (10 mg total) by mouth at bedtime. 90 tablet 3   Multiple Vitamins-Calcium (ONE-A-DAY WOMENS PO) Take 1 tablet by mouth daily.      omeprazole (PRILOSEC) 20 MG capsule Take 1 capsule (20 mg total) by mouth daily. 90 capsule 3   Zinc 50 MG CAPS Take by mouth.     amLODipine (NORVASC) 5 MG tablet Take 1 tablet (5 mg total) by mouth daily. 90 tablet 3   Cetirizine HCl (ZYRTEC PO) Take 1 tablet by mouth daily.      No facility-administered medications prior to visit.    ROS See HPI  Objective:  BP 130/89 (BP Location: Left Arm, Patient Position: Sitting, Cuff Size: Large)    Pulse 60    Temp (!) 97 F (36.1  C) (Temporal)    Wt 235 lb (106.6 kg)    SpO2 98%    BMI 39.11 kg/m   Physical Exam Cardiovascular:     Rate and Rhythm: Normal rate.     Pulses: Normal pulses.  Pulmonary:     Effort: Pulmonary effort is normal.     Breath sounds: Normal breath sounds. No wheezing or rales.  Musculoskeletal:     Right lower leg: No edema.     Left lower leg: No edema.  Neurological:     Mental Status: She is alert and oriented to person, place, and time.   Assessment & Plan:  This visit occurred during the SARS-CoV-2 public health emergency.  Safety protocols were in place, including screening questions prior to the visit, additional usage of staff PPE, and extensive cleaning of exam room while  observing appropriate contact time as indicated for disinfecting solutions.   Claire Sutton was seen today for follow-up.  Diagnoses and all orders for this visit:  Essential hypertension -     amLODipine (NORVASC) 5 MG tablet; Take 1 tablet (5 mg total) by mouth daily.  Seasonal allergic rhinitis due to pollen -     levocetirizine (XYZAL) 5 MG tablet; Take 1 tablet (5 mg total) by mouth every evening. -     azelastine (ASTELIN) 0.1 % nasal spray; Place 1 spray into both nostrils 2 (two) times daily.  Chronic bilateral low back pain with bilateral sciatica    Problem List Items Addressed This Visit       Cardiovascular and Mediastinum   Essential hypertension - Primary    Improved with losartan and amlodipine BP Readings from Last 3 Encounters:  12/29/21 130/89  10/18/21 (!) 143/86  10/17/21 (!) 144/90   Maintain med doses Refill sent      Relevant Medications   amLODipine (NORVASC) 5 MG tablet     Respiratory   Seasonal allergic rhinitis due to pollen    Persistent nasal congestion, cough x 46months Also has intermittent nose bleed Some improvement with oral prednisone dose pack No improvement with albuterol. Current daily use of nasonex, zyrtec, and montelukast. Last allergy test: dust mite, cockrach, milk , peanuts and soy.  Stop zyrtec, Start xyzal Stop nasonex, Start azelastine nasal spray Use saline nasal spray in between to moisturize nasal passages If no improvement in 1-2week, Call office. Send oral abx, oral prednisone and ref to allergist if no improvement      Relevant Medications   levocetirizine (XYZAL) 5 MG tablet   azelastine (ASTELIN) 0.1 % nasal spray     Nervous and Auditory   Chronic bilateral low back pain with bilateral sciatica    Secondary to neual foraminal stenosis and DDD of lumbar spine Improved back pain with PT Persistent numbness in left foot and right foot pain. She quit [employer] job. Works from home at this time. Had eval by Dr. Lorin Sutton:  only PT and weight loss recommended       Follow-up: Return in about 3 months (around 03/28/2022) for CPE (fasting).  Wilfred Lacy, NP

## 2021-12-29 NOTE — Assessment & Plan Note (Signed)
Improved with losartan and amlodipine BP Readings from Last 3 Encounters:  12/29/21 130/89  10/18/21 (!) 143/86  10/17/21 (!) 144/90   Maintain med doses Refill sent

## 2021-12-29 NOTE — Assessment & Plan Note (Addendum)
Persistent nasal congestion, cough x 8months Also has intermittent nose bleed Some improvement with oral prednisone dose pack No improvement with albuterol. Current daily use of nasonex, zyrtec, and montelukast. Last allergy test: dust mite, cockrach, milk , peanuts and soy.  Stop zyrtec, Start xyzal Stop nasonex, Start azelastine nasal spray Use saline nasal spray in between to moisturize nasal passages If no improvement in 1-2week, Call office. Send oral abx, oral prednisone and ref to allergist if no improvement

## 2022-01-12 ENCOUNTER — Other Ambulatory Visit: Payer: Self-pay | Admitting: Nurse Practitioner

## 2022-01-12 DIAGNOSIS — J4520 Mild intermittent asthma, uncomplicated: Secondary | ICD-10-CM

## 2022-01-12 DIAGNOSIS — E785 Hyperlipidemia, unspecified: Secondary | ICD-10-CM

## 2022-01-24 ENCOUNTER — Other Ambulatory Visit: Payer: Self-pay | Admitting: Nurse Practitioner

## 2022-01-24 DIAGNOSIS — I10 Essential (primary) hypertension: Secondary | ICD-10-CM

## 2022-02-05 ENCOUNTER — Encounter (HOSPITAL_COMMUNITY): Payer: Self-pay | Admitting: Emergency Medicine

## 2022-02-05 ENCOUNTER — Other Ambulatory Visit: Payer: Self-pay

## 2022-02-05 ENCOUNTER — Ambulatory Visit (HOSPITAL_COMMUNITY)
Admission: EM | Admit: 2022-02-05 | Discharge: 2022-02-05 | Disposition: A | Discharge status: Home / Self Care | Payer: Managed Care, Other (non HMO) | Attending: Urgent Care | Admitting: Urgent Care

## 2022-02-05 DIAGNOSIS — J3489 Other specified disorders of nose and nasal sinuses: Secondary | ICD-10-CM | POA: Diagnosis not present

## 2022-02-05 DIAGNOSIS — R220 Localized swelling, mass and lump, head: Secondary | ICD-10-CM | POA: Diagnosis not present

## 2022-02-05 DIAGNOSIS — K047 Periapical abscess without sinus: Secondary | ICD-10-CM

## 2022-02-05 MED ORDER — IBUPROFEN 800 MG PO TABS: 800.0000 mg | ORAL_TABLET | Freq: Four times a day (QID) | ORAL | 0 refills | Status: DC | PRN

## 2022-02-05 MED ORDER — CLINDAMYCIN HCL 300 MG PO CAPS: 300.0000 mg | ORAL_CAPSULE | Freq: Four times a day (QID) | ORAL | 0 refills | Status: AC

## 2022-02-05 NOTE — Discharge Instructions (Signed)
Your symptoms are likely related to a dental/ mucosal infection with extension to the sinus cavity. ?Please follow-up with your PCP to consider getting a CT scan of your sinuses.  Should symptoms persist please get a referral to an ear nose throat. ?You can alternate Tylenol and ibuprofen for pain control every 4 hours.  Ibuprofen helps with the inflammation. ?Please take the antibiotic every 6 hours, which is 4 times daily. ?Side effects include diarrhea.  Please take yogurt daily, and eat with the medication.  Also consider purchasing Florastor, and over-the-counter probiotic to prevent side effects. ?

## 2022-02-05 NOTE — ED Triage Notes (Signed)
Pt reports having sinus issues. Pt having right side facial swelling since Friday. Been alternating tylenol and ibuprofen.  ?

## 2022-02-05 NOTE — ED Provider Notes (Signed)
?[facility] ? ? ? ?CSN: [id number] ?Arrival date & time: 02/05/22  1052 ? ? ?  ? ?History   ?Chief Complaint ?Chief Complaint  ?Patient presents with  ? Facial Swelling  ? Sinus Problem  ? ? ?HPI ?Claire Sutton is a 61 y.o. female.  ? ?Pleasant 61 year old female presents today with right-sided gum pain, facial swelling, maxillary sinus pain.  She states that several years ago she had a tooth extraction and a root canal to the upper posterior molars on the right side.  Since then, every 4 months or so she has been having sinus pain and facial swelling.  She went to her dentist several months ago and had an evaluation, was told it was not her tooth.  Her last episode similar to this was in December.  She does have a known history of allergies.  She was recently changed from Zyrtec to Xyzal, montelukast, and nasal sprays.  She feels that she has dried out all the mucus in her nose and throat, but now is having pain and swelling to her right maxillary sinus.  She admits she has had the symptoms in the past and has responded well to antibiotics.  She denies any systemic symptoms including headache, fever, redness of the skin, myalgias, weakness.  She has tried over-the-counter Tylenol and ibuprofen with good response to her pain. ? ? ?Sinus Problem ? ? ?Past Medical History:  ?Diagnosis Date  ? Allergy   ? ASCUS (atypical squamous cells of undetermined significance) on Pap smear 07/2006  ? NEG HIGH RISK HPV-- THEN IN 10/2006 AND 07/2007 PAPS WITH BENIGN REACTIVE CHANGES  ? Asthma   ? GERD (gastroesophageal reflux disease)   ? Headache   ? History of blood clot in brain   ? a. Fall age 85 - was told she had a blood clot in the brain from this.  ? Hyperlipidemia   ? Hypertension   ? LGSIL (low grade squamous intraepithelial dysplasia) 10/05  ? PAP ON 02/2005, 7,8,9,10,11,12WNL  ? Obesity   ? Seizures (Tus[place]um)   ? a. began after she had a fall at age 57, last seizure age 56.  ? ? ?Patient Active Problem List  ?  Diagnosis Date Noted  ? Seasonal allergic rhinitis due to pollen 12/29/2021  ? Viral syndrome 12/20/2021  ? Neural foraminal stenosis of lumbar spine 09/21/2021  ? DDD (degenerative disc disease), lumbar 09/21/2021  ? Numbness of left foot 09/21/2021  ? Chronic bilateral low back pain with bilateral sciatica 08/31/2021  ? Postmenopausal estrogen deficiency 01/22/2019  ? History of colon polyps 06/13/2018  ? Hip pain, chronic 03/07/2018  ? Reactive airway disease 03/14/2017  ? Abnormal EKG 10/18/2016  ? Dyslipidemia 10/18/2016  ? Essential hypertension 09/15/2016  ? Obesity   ? Gastroesophageal reflux disease without esophagitis 09/07/2016  ? Acute bronchitis 09/07/2016  ? Intermediate uveitis of left eye 06/16/2014  ? Scleritis and episcleritis of left eye 06/16/2014  ? ? ?Past Surgical History:  ?Procedure Laterality Date  ? COLPOSCOPY    ? ENDOMETRIAL ABLATION  02/2007  ? NOVASURE/MARSUPILIZATION OF BARTHOLIN  ? MARSUPILIZATION OF BARTHOLIN  02/2007  ? AT TIME OF NOVASURE ABLATION SURGERY  ? TUBAL LIGATION    ? ? ?OB History   ? ? Gravida  ?3  ? Para  ?2  ? Term  ?   ? Preterm  ?   ? AB  ?1  ? Living  ?2  ?  ? ? SAB  ?   ?  IAB  ?1  ? Ectopic  ?   ? Multiple  ?   ? Live Births  ?   ?   ?  ?  ? ? ? ?Home Medications   ? ?Prior to Admission medications   ?Medication Sig Start Date End Date Taking? Authorizing Provider  ?clindamycin (CLEOCIN) 300 MG capsule Take 1 capsule (300 mg total) by mouth 4 (four) times daily for 7 days. 02/05/22 02/12/22 Yes Corran Lalone L, PA  ?ibuprofen (ADVIL) 800 MG tablet Take 1 tablet (800 mg total) by mouth every 6 (six) hours as needed for moderate pain (swelling). 02/05/22  Yes Javares Kaufhold L, PA  ?albuterol (VENTOLIN HFA) 108 (90 Base) MCG/ACT inhaler INHALE 1 TO 2 PUFFS INTO THE LUNGS EVERY 6 HOURS AS NEEDED FOR WHEEZING OR SHORTNESS OF BREATH 01/12/22   Nche, Charlene Brooke, NP  ?amLODipine (NORVASC) 5 MG tablet Take 1 tablet (5 mg total) by mouth daily. 12/29/21   Nche, Charlene Brooke,  NP  ?atorvastatin (LIPITOR) 20 MG tablet TAKE 1 TABLET(20 MG) BY MOUTH DAILY 01/12/22   Nche, Charlene Brooke, NP  ?azelastine (ASTELIN) 0.1 % nasal spray Place 1 spray into both nostrils 2 (two) times daily. 12/29/21   Nche, Charlene Brooke, NP  ?benzonatate (TESSALON) 200 MG capsule Take 1 capsule (200 mg total) by mouth 2 (two) times daily as needed for cough. 12/20/21   Libby Maw, MD  ?Cholecalciferol (VITAMIN D PO) Take 1 tablet by mouth daily.     [provider]  ?Coenzyme Q10 (CO Q 10 PO) Take by mouth.    [provider]  ?Cyanocobalamin (B-12) 1000 MCG CAPS Take by mouth.    [provider]  ?Diclofenac Sodium (PENNSAID) 2 % SOLN Apply 1 inch topically 2 (two) times daily. 10/21/20   Kennyth Arnold, FNP  ?EPINEPHrine 0.3 mg/0.3 mL IJ SOAJ injection Inject 0.3 mg into the muscle as needed for anaphylaxis. 05/27/21   Nche, Charlene Brooke, NP  ?levocetirizine (XYZAL) 5 MG tablet Take 1 tablet (5 mg total) by mouth every evening. 12/29/21   Nche, Charlene Brooke, NP  ?losartan (COZAAR) 50 MG tablet Take 1 tablet (50 mg total) by mouth daily. 10/17/21   Nche, Charlene Brooke, NP  ?mometasone (NASONEX) 50 MCG/ACT nasal spray Place 2 sprays into the nose daily. 04/12/21   Nche, Charlene Brooke, NP  ?montelukast (SINGULAIR) 10 MG tablet Take 1 tablet (10 mg total) by mouth at bedtime. 01/10/21   Nche, Charlene Brooke, NP  ?Multiple Vitamins-Calcium (ONE-A-DAY WOMENS PO) Take 1 tablet by mouth daily.     [provider]  ?omeprazole (PRILOSEC) 20 MG capsule Take 1 capsule (20 mg total) by mouth daily. 01/10/21   Nche, Charlene Brooke, NP  ?Zinc 50 MG CAPS Take by mouth.    [provider]  ? ? ?Family History ?Family History  ?Problem Relation Age of Onset  ? Hypertension Mother   ? Hyperlipidemia Mother   ? Heart disease Mother   ?     was told she may have had a few MIs that she was unaware of  ? Diabetes Father   ? Hypertension Sister   ? Diabetes Paternal Grandfather   ? Colon  cancer Neg Hx   ? Esophageal cancer Neg Hx   ? Rectal cancer Neg Hx   ? Stomach cancer Neg Hx   ? ? ?Social History ?Social History  ? ?Tobacco Use  ? Smoking status: Never  ? Smokeless tobacco: Never  ?Vaping  Use  ? Vaping Use: Never used  ?Substance Use Topics  ? Alcohol use: No  ?  Alcohol/week: 0.0 standard drinks  ? Drug use: No  ? ? ? ?Allergies   ?Codeine and Cyclobenzaprine ? ? ?Review of Systems ?Review of Systems  ?HENT:  Positive for dental problem and sinus pain.   ?All other systems reviewed and are negative. ? ? ?Physical Exam ?Triage Vital Signs ?ED Triage Vitals  ?Enc Vitals Group  ?   BP 02/05/22 1230 139/76  ?   Pulse Rate 02/05/22 1228 63  ?   Resp 02/05/22 1228 18  ?   Temp 02/05/22 1228 97.8 ?F (36.6 ?C)  ?   Temp Source 02/05/22 1228 Oral  ?   SpO2 02/05/22 1228 99 %  ?   Weight --   ?   Height --   ?   Head Circumference --   ?   Peak Flow --   ?   Pain Score 02/05/22 1227 3  ?   Pain Loc --   ?   Pain Edu? --   ?   Excl. in [place]? --   ? ?No data found. ? ?Updated Vital Signs ?BP 139/76   Pulse 63   Temp 97.8 ?F (36.6 ?C) (Oral)   Resp 18   SpO2 99%  ? ?Visual Acuity ?Right Eye Distance:   ?Left Eye Distance:   ?Bilateral Distance:   ? ?Right Eye Near:   ?Left Eye Near:    ?Bilateral Near:    ? ?Physical Exam ?Vitals and nursing note reviewed.  ?Constitutional:   ?   General: She is not in acute distress. ?   Appearance: Normal appearance. She is well-developed. She is obese. She is not ill-appearing, toxic-appearing or diaphoretic.  ?HENT:  ?   Head: Normocephalic and atraumatic.  ?   Right Ear: Tympanic membrane, ear canal and external ear normal. There is no impacted cerumen.  ?   Left Ear: Tympanic membrane, ear canal and external ear normal. There is no impacted cerumen.  ?   Nose: Nose normal. No congestion or rhinorrhea.  ?   Mouth/Throat:  ?   Mouth: Mucous membranes are moist.  ?   Pharynx: Oropharynx is clear. No oropharyngeal exudate or posterior oropharyngeal erythema.  ?    Comments: Severe pain to buccal mucosa of upper R posterior molar. Decay noted to molar in front of crown. Swelling to R cheek and pain to palpation of the maxillary sinus on the R without appreciable abscess or cellul

## 2022-02-06 ENCOUNTER — Ambulatory Visit: Payer: Managed Care, Other (non HMO) | Admitting: Obstetrics & Gynecology

## 2022-02-24 ENCOUNTER — Ambulatory Visit: Payer: Managed Care, Other (non HMO) | Admitting: Obstetrics & Gynecology

## 2022-03-08 ENCOUNTER — Other Ambulatory Visit (HOSPITAL_COMMUNITY)
Admission: RE | Admit: 2022-03-08 | Discharge: 2022-03-08 | Disposition: A | Discharge status: Home / Self Care | Payer: Commercial Managed Care - HMO | Source: Ambulatory Visit | Attending: Obstetrics & Gynecology | Admitting: Obstetrics & Gynecology

## 2022-03-08 ENCOUNTER — Ambulatory Visit (INDEPENDENT_AMBULATORY_CARE_PROVIDER_SITE_OTHER): Payer: Managed Care, Other (non HMO) | Admitting: Obstetrics & Gynecology

## 2022-03-08 ENCOUNTER — Encounter: Payer: Self-pay | Admitting: Obstetrics & Gynecology

## 2022-03-08 ENCOUNTER — Other Ambulatory Visit: Payer: Self-pay | Admitting: Nurse Practitioner

## 2022-03-08 VITALS — BP 120/72 | HR 68 | Resp 16 | Ht 65.25 in | Wt 241.0 lb

## 2022-03-08 DIAGNOSIS — Z01419 Encounter for gynecological examination (general) (routine) without abnormal findings: Secondary | ICD-10-CM | POA: Diagnosis not present

## 2022-03-08 DIAGNOSIS — Z6839 Body mass index (BMI) 39.0-39.9, adult: Secondary | ICD-10-CM | POA: Diagnosis not present

## 2022-03-08 DIAGNOSIS — Z780 Asymptomatic menopausal state: Secondary | ICD-10-CM

## 2022-03-08 DIAGNOSIS — J4520 Mild intermittent asthma, uncomplicated: Secondary | ICD-10-CM

## 2022-03-08 DIAGNOSIS — K219 Gastro-esophageal reflux disease without esophagitis: Secondary | ICD-10-CM

## 2022-03-08 NOTE — Telephone Encounter (Signed)
Chart supports Rx ?Next OV 03/29/22 ?

## 2022-03-08 NOTE — Progress Notes (Signed)
? ? ?Claire Sutton 01/23/1961 [id number] ? ? ?History:    61 y.o.  G3P2A1L2 Single ?  ?RP:  Established patient presenting for annual gyn exam  ?  ?HPI: Postmenopausal, well on no HRT. No PMB.  H/O endometrial ablation. Minimal hot flashes.  No pelvic pain.  Abstinent. Pap smear/HPV 01/2019 normal. No h/o abnormal Pap.  Pap reflex done today.  Breasts normal.  Mammo 10/2021, Left Dx mammo/US 11/2021.  6 month Left Dx Mammo/US scheduled in 05/2022. Urine/BMs normal.  BMI 39.8.  Health labs with Fam MD.  Treated for hypercholesterolemia.  Colonoscopy 2019.  Had major dental work recently.  BD notrmal in 2021. ?  ? ?Past medical history,surgical history, family history and social history were all reviewed and documented in the EPIC chart. ? ?Gynecologic History ?No LMP recorded. Patient has had an ablation. ? ?Obstetric History ?OB History  ?Gravida Para Term Preterm AB Living  ?'3 2     1 2  '$ ?SAB IAB Ectopic Multiple Live Births  ?  1        ?  ?# Outcome Date GA Lbr Len/2nd Weight Sex Delivery Anes PTL Lv  ?3 IAB           ?2 Para           ?1 Para           ? ? ? ?ROS: A ROS was performed and pertinent positives and negatives are included in the history. ? GENERAL: No fevers or chills. HEENT: No change in vision, no earache, sore throat or sinus congestion. NECK: No pain or stiffness. CARDIOVASCULAR: No chest pain or pressure. No palpitations. PULMONARY: No shortness of breath, cough or wheeze. GASTROINTESTINAL: No abdominal pain, nausea, vomiting or diarrhea, melena or bright red blood per rectum. GENITOURINARY: No urinary frequency, urgency, hesitancy or dysuria. MUSCULOSKELETAL: No joint or muscle pain, no back pain, no recent trauma. DERMATOLOGIC: No rash, no itching, no lesions. ENDOCRINE: No polyuria, polydipsia, no heat or cold intolerance. No recent change in weight. HEMATOLOGICAL: No anemia or easy bruising or bleeding. NEUROLOGIC: No headache, seizures, numbness, tingling or weakness. PSYCHIATRIC: No  depression, no loss of interest in normal activity or change in sleep pattern.  ?  ? ?Exam: ? ? ?BP 120/72   Pulse 68   Resp 16   Ht 5' 5.25" (1.657 m)   Wt 241 lb (109.3 kg)   BMI 39.80 kg/m?  ? ?Body mass index is 39.8 kg/m?. ? ?General appearance : Well developed well nourished female. No acute distress ?HEENT: Eyes: no retinal hemorrhage or exudates,  Neck supple, trachea midline, no carotid bruits, no thyroidmegaly ?Lungs: Clear to auscultation, no rhonchi or wheezes, or rib retractions  ?Heart: Regular rate and rhythm, no murmurs or gallops ?Breast:Examined in sitting and supine position were symmetrical in appearance, no palpable masses or tenderness,  no skin retraction, no nipple inversion, no nipple discharge, no skin discoloration, no axillary or supraclavicular lymphadenopathy ?Abdomen: no palpable masses or tenderness, no rebound or guarding ?Extremities: no edema or skin discoloration or tenderness ? ?Pelvic: Vulva: Normal ?            Vagina: No gross lesions or discharge ? Cervix: No gross lesions or discharge.  Pap reflex done. ? Uterus  AV, normal size, shape and consistency, non-tender and mobile ? Adnexa  Without masses or tenderness ? Anus: Normal ? ? ?Assessment/Plan:  61 y.o. female for annual exam  ? ?1. Encounter for routine gynecological examination with  Papanicolaou smear of cervix ?Postmenopausal, well on no HRT. No PMB.  H/O endometrial ablation. Minimal hot flashes.  No pelvic pain.  Abstinent. Pap smear/HPV 01/2019 normal. No h/o abnormal Pap.  Pap reflex done today.  Breasts normal.  Mammo 10/2021, Left Dx mammo/US 11/2021.  6 month Left Dx Mammo/US scheduled in 05/2022. Urine/BMs normal.  BMI 39.8.  Health labs with Fam MD.  Treated for hypercholesterolemia.  Colonoscopy 2019.  Had major dental work recently.  BD notrmal in 2021. ? - Cytology - PAP( [facility]) ? ?2. Postmenopause ?Postmenopausal, well on no HRT. No PMB.  H/O endometrial ablation. Minimal hot flashes.  No pelvic  pain.  Abstinent.  ? ?3. Class 2 severe obesity due to excess calories with serious comorbidity and body mass index (BMI) of 39.0 to 39.9 in adult [facility]) ?Will get back on a low calorie/carb diet.  Regular fitness activities. ? ?Other orders ?- UNABLE TO FIND; Med Name: antibiotics for oral surgery  ? ?Princess Bruins MD, 11:42 AM 03/08/2022 ? ?  ?

## 2022-03-09 LAB — CYTOLOGY - PAP: Diagnosis: NEGATIVE

## 2022-03-29 ENCOUNTER — Ambulatory Visit (INDEPENDENT_AMBULATORY_CARE_PROVIDER_SITE_OTHER): Payer: Commercial Managed Care - HMO | Admitting: Nurse Practitioner

## 2022-03-29 ENCOUNTER — Encounter: Payer: Self-pay | Admitting: Nurse Practitioner

## 2022-03-29 VITALS — BP 122/72 | HR 71 | Temp 97.3°F | Ht 62.0 in | Wt 240.2 lb

## 2022-03-29 DIAGNOSIS — G8929 Other chronic pain: Secondary | ICD-10-CM

## 2022-03-29 DIAGNOSIS — I10 Essential (primary) hypertension: Secondary | ICD-10-CM

## 2022-03-29 DIAGNOSIS — M5442 Lumbago with sciatica, left side: Secondary | ICD-10-CM

## 2022-03-29 DIAGNOSIS — E785 Hyperlipidemia, unspecified: Secondary | ICD-10-CM

## 2022-03-29 DIAGNOSIS — Z23 Encounter for immunization: Secondary | ICD-10-CM | POA: Diagnosis not present

## 2022-03-29 DIAGNOSIS — M5441 Lumbago with sciatica, right side: Secondary | ICD-10-CM | POA: Diagnosis not present

## 2022-03-29 DIAGNOSIS — Z0001 Encounter for general adult medical examination with abnormal findings: Secondary | ICD-10-CM

## 2022-03-29 DIAGNOSIS — L819 Disorder of pigmentation, unspecified: Secondary | ICD-10-CM | POA: Insufficient documentation

## 2022-03-29 LAB — COMPREHENSIVE METABOLIC PANEL
ALT: 22 U/L (ref 0–35)
AST: 20 U/L (ref 0–37)
Albumin: 3.9 g/dL (ref 3.5–5.2)
Alkaline Phosphatase: 102 U/L (ref 39–117)
BUN: 12 mg/dL (ref 6–23)
CO2: 28 mEq/L (ref 19–32)
Calcium: 9 mg/dL (ref 8.4–10.5)
Chloride: 107 mEq/L (ref 96–112)
Creatinine, Ser: 0.85 mg/dL (ref 0.40–1.20)
GFR: 73.56 mL/min (ref >60.00)
Glucose, Bld: 96 mg/dL (ref 70–99)
Potassium: 3.4 mEq/L — ABNORMAL LOW (ref 3.5–5.1)
Sodium: 142 mEq/L (ref 135–145)
Total Bilirubin: 0.8 mg/dL (ref 0.2–1.2)
Total Protein: 7.2 g/dL (ref 6.0–8.3)

## 2022-03-29 LAB — LIPID PANEL
Cholesterol: 150 mg/dL (ref 0–200)
HDL: 53 mg/dL (ref >39.00)
LDL Cholesterol: 82 mg/dL (ref 0–99)
NonHDL: 96.73
Total CHOL/HDL Ratio: 3
Triglycerides: 74 mg/dL (ref 0.0–149.0)
VLDL: 14.8 mg/dL (ref 0.0–40.0)

## 2022-03-29 NOTE — Assessment & Plan Note (Signed)
Repeat lipid panel: Lipid panel: LDL at goal. Maintain atorvastatin dose

## 2022-03-29 NOTE — Patient Instructions (Addendum)
Go to lab  Preventive Care 62-62 Years Old, Female Preventive care refers to lifestyle choices and visits with your health care provider that can promote health and wellness. Preventive care visits are also called wellness exams. What can I expect for my preventive care visit? Counseling Your health care provider may ask you questions about your: Medical history, including: Past medical problems. Family medical history. Pregnancy history. Current health, including: Menstrual cycle. Method of birth control. Emotional well-being. Home life and relationship well-being. Sexual activity and sexual health. Lifestyle, including: Alcohol, nicotine or tobacco, and drug use. Access to firearms. Diet, exercise, and sleep habits. Work and work environment. Sunscreen use. Safety issues such as seatbelt and bike helmet use. Physical exam Your health care provider will check your: Height and weight. These may be used to calculate your BMI (body mass index). BMI is a measurement that tells if you are at a healthy weight. Waist circumference. This measures the distance around your waistline. This measurement also tells if you are at a healthy weight and may help predict your risk of certain diseases, such as type 2 diabetes and high blood pressure. Heart rate and blood pressure. Body temperature. Skin for abnormal spots. What immunizations do I need?  Vaccines are usually given at various ages, according to a schedule. Your health care provider will recommend vaccines for you based on your age, medical history, and lifestyle or other factors, such as travel or where you work. What tests do I need? Screening Your health care provider may recommend screening tests for certain conditions. This may include: Lipid and cholesterol levels. Diabetes screening. This is done by checking your blood sugar (glucose) after you have not eaten for a while (fasting). Pelvic exam and Pap test. Hepatitis B  test. Hepatitis C test. HIV (human immunodeficiency virus) test. STI (sexually transmitted infection) testing, if you are at risk. Lung cancer screening. Colorectal cancer screening. Mammogram. Talk with your health care provider about when you should start having regular mammograms. This may depend on whether you have a family history of breast cancer. BRCA-related cancer screening. This may be done if you have a family history of breast, ovarian, tubal, or peritoneal cancers. Bone density scan. This is done to screen for osteoporosis. Talk with your health care provider about your test results, treatment options, and if necessary, the need for more tests. Follow these instructions at home: Eating and drinking  Eat a diet that includes fresh fruits and vegetables, whole grains, lean protein, and low-fat dairy products. Take vitamin and mineral supplements as recommended by your health care provider. Do not drink alcohol if: Your health care provider tells you not to drink. You are pregnant, may be pregnant, or are planning to become pregnant. If you drink alcohol: Limit how much you have to 0-1 drink a day. Know how much alcohol is in your drink. In the [place] one drink equals one 12 oz bottle of beer (355 mL), one 5 oz glass of wine (148 mL), or one 1 oz glass of hard liquor (44 mL). Lifestyle Brush your teeth every morning and night with fluoride toothpaste. Floss one time each day. Exercise for at least 30 minutes 5 or more days each week. Do not use any products that contain nicotine or tobacco. These products include cigarettes, chewing tobacco, and vaping devices, such as e-cigarettes. If you need help quitting, ask your health care provider. Do not use drugs. If you are sexually active, practice safe sex. Use a condom or other   form of protection to prevent STIs. If you do not wish to become pregnant, use a form of birth control. If you plan to become pregnant, see your health care  provider for a prepregnancy visit. Take aspirin only as told by your health care provider. Make sure that you understand how much to take and what form to take. Work with your health care provider to find out whether it is safe and beneficial for you to take aspirin daily. Find healthy ways to manage stress, such as: Meditation, yoga, or listening to music. Journaling. Talking to a trusted person. Spending time with friends and family. Minimize exposure to UV radiation to reduce your risk of skin cancer. Safety Always wear your seat belt while driving or riding in a vehicle. Do not drive: If you have been drinking alcohol. Do not ride with someone who has been drinking. When you are tired or distracted. While texting. If you have been using any mind-altering substances or drugs. Wear a helmet and other protective equipment during sports activities. If you have firearms in your house, make sure you follow all gun safety procedures. Seek help if you have been physically or sexually abused. What's next? Visit your health care provider once a year for an annual wellness visit. Ask your health care provider how often you should have your eyes and teeth checked. Stay up to date on all vaccines. This information is not intended to replace advice given to you by your health care provider. Make sure you discuss any questions you have with your health care provider. Document Revised: 04/20/2021 Document Reviewed: 04/20/2021 Elsevier Patient Education  [place].

## 2022-03-29 NOTE — Assessment & Plan Note (Signed)
Chronic, waxing and weaning Requesting referral to dermatology

## 2022-03-29 NOTE — Progress Notes (Signed)
Complete physical exam  Patient: Claire Sutton   DOB: 1961-08-18   61 y.o. Female  MRN: [id number] Visit Date: 03/29/2022  Subjective:    Chief Complaint  Patient presents with   Annual Exam    CPE  PT fasting No concerns Shingles vaccine given today   Claire Sutton is a 61 y.o. female who presents today for a complete physical exam. She reports consuming a general diet.  Walking and gardening.  She generally feels well. She reports sleeping well. She does have additional problems to discuss today.  Vision:Yes Dental:No  Most recent fall risk assessment:    03/29/2022   10:04 AM  [place] in the past year? 0  Number falls in past yr: 0  Injury with Fall? 0   Most recent depression screenings:    03/29/2022   10:50 AM 12/20/2021   10:31 AM  PHQ 2/9 Scores  PHQ - 2 Score 0 0  PHQ- 9 Score 1    HPI  Chronic bilateral low back pain with bilateral sciatica Improved back and hip pain with completion of PT x 7weeks and change in job.  Dyslipidemia Repeat lipid panel: Lipid panel: LDL at goal. Maintain atorvastatin dose  Essential hypertension BP at goal with amlodipine and losartan BP Readings from Last 3 Encounters:  03/29/22 122/72  03/08/22 120/72  02/05/22 139/76   Repeat CMP: normal except mild decline in potassium. Maintain potassium rich food: dark leafy vegetables, potatoes, avocado, tomatoes, almonds, dates, pumpkin seeds, raisins, banana etc.  Maintain current med doses   Hyperpigmentation Chronic, waxing and weaning Requesting referral to dermatology  Past Medical History:  Diagnosis Date   Allergy    ASCUS (atypical squamous cells of undetermined significance) on Pap smear 07/2006   NEG HIGH RISK HPV-- THEN IN 10/2006 AND 07/2007 PAPS WITH BENIGN REACTIVE CHANGES   Asthma    GERD (gastroesophageal reflux disease)    Headache    History of blood clot in brain    a. Fall age 60 - was told she had a blood clot in the brain from this.    Hyperlipidemia    Hypertension    LGSIL (low grade squamous intraepithelial dysplasia) 10/05   PAP ON 02/2005, 7,8,9,10,11,12WNL   Obesity    Seizures ([place])    a. began after she had a fall at age 49, last seizure age 12.   Past Surgical History:  Procedure Laterality Date   COLPOSCOPY     ENDOMETRIAL ABLATION  02/2007   NOVASURE/MARSUPILIZATION OF BARTHOLIN   MARSUPILIZATION OF BARTHOLIN  02/2007   AT TIME OF NOVASURE ABLATION SURGERY   MOUTH SURGERY     TUBAL LIGATION     Social History   Socioeconomic History   Marital status: Single    Spouse name: Not on file   Number of children: Not on file   Years of education: Not on file   Highest education level: Not on file  Occupational History   Not on file  Tobacco Use   Smoking status: Never   Smokeless tobacco: Never  Vaping Use   Vaping Use: Never used  Substance and Sexual Activity   Alcohol use: No    Alcohol/week: 0.0 standard drinks   Drug use: No   Sexual activity: Not Currently    Birth control/protection: Post-menopausal    Comment: 1st intercourse 61 yo-More than 5 partners  Other Topics Concern   Not on file  Social History Narrative  Not on file   Social Determinants of Health   Financial Resource Strain: Not on file  Food Insecurity: Not on file  Transportation Needs: Not on file  Physical Activity: Not on file  Stress: Not on file  Social Connections: Not on file  Intimate Partner Violence: Not on file   Family Status  Relation Name Status   Mother  Alive   Father  Deceased   Sister  Alive   MGM  Deceased   MGF  Deceased   PGM  Deceased   PGF  Deceased   Family History  Problem Relation Age of Onset   Hypertension Mother    Hyperlipidemia Mother    Heart disease Mother        was told she may have had a few MIs that she was unaware of   Diabetes Father    Arthritis Sister        lumbar DDD with spinal stenosis   Hypertension Sister    Diabetes Paternal Grandfather     Allergies  Allergen Reactions   Codeine     COLD SWEAT, LIGHT-HEADEDNESS. Cold sweats   Cyclobenzaprine Rash    Patient Care Team: Gaytha Raybourn, Charlene Brooke, NP as PCP - General (Internal Medicine)   Medications: Outpatient Medications Prior to Visit  Medication Sig   albuterol (VENTOLIN HFA) 108 (90 Base) MCG/ACT inhaler INHALE 1 TO 2 PUFFS INTO THE LUNGS EVERY 6 HOURS AS NEEDED FOR WHEEZING OR SHORTNESS OF BREATH   amLODipine (NORVASC) 5 MG tablet Take 1 tablet (5 mg total) by mouth daily.   atorvastatin (LIPITOR) 20 MG tablet TAKE 1 TABLET(20 MG) BY MOUTH DAILY   Cholecalciferol (VITAMIN D PO) Take 1 tablet by mouth daily.    Cyanocobalamin (B-12) 1000 MCG CAPS Take by mouth.   Diclofenac Sodium (PENNSAID) 2 % SOLN Apply 1 inch topically 2 (two) times daily.   EPINEPHrine 0.3 mg/0.3 mL IJ SOAJ injection Inject 0.3 mg into the muscle as needed for anaphylaxis.   ibuprofen (ADVIL) 800 MG tablet Take 1 tablet (800 mg total) by mouth every 6 (six) hours as needed for moderate pain (swelling). (Patient taking differently: Take 600 mg by mouth every 6 (six) hours as needed for moderate pain (swelling).)   levocetirizine (XYZAL) 5 MG tablet Take 1 tablet (5 mg total) by mouth every evening.   losartan (COZAAR) 50 MG tablet Take 1 tablet (50 mg total) by mouth daily.   montelukast (SINGULAIR) 10 MG tablet TAKE 1 TABLET(10 MG) BY MOUTH AT BEDTIME   Multiple Vitamins-Calcium (ONE-A-DAY WOMENS PO) Take 1 tablet by mouth daily.    omeprazole (PRILOSEC) 20 MG capsule TAKE 1 CAPSULE(20 MG) BY MOUTH DAILY   UNABLE TO FIND Med Name: antibiotics for oral surgery   Zinc 50 MG CAPS Take by mouth.   No facility-administered medications prior to visit.    Review of Systems  Constitutional: Negative.  Negative for fever.  HENT:  Negative for congestion and sore throat.   Eyes:        Negative for visual changes  Respiratory:  Negative for cough and shortness of breath.   Cardiovascular:  Negative  for chest pain, palpitations and leg swelling.  Gastrointestinal:  Negative for blood in stool, constipation and diarrhea.  Genitourinary:  Negative for dysuria, frequency and urgency.  Musculoskeletal:  Negative for myalgias.  Skin:  Negative for rash.  Neurological:  Negative for dizziness and headaches.  Hematological:  Does not bruise/bleed easily.  Psychiatric/Behavioral:  Negative for suicidal  ideas. The patient is not nervous/anxious.        Objective:  BP 122/72 (BP Location: Left Arm, Patient Position: Sitting, Cuff Size: Normal)   Pulse 71   Temp (!) 97.3 F (36.3 C) (Temporal)   Ht '5\' 2"'$  (1.575 m)   Wt 240 lb 3.2 oz (109 kg)   SpO2 97%   BMI 43.93 kg/m     BP Readings from Last 3 Encounters:  03/29/22 122/72  03/08/22 120/72  02/05/22 139/76   Wt Readings from Last 3 Encounters:  03/29/22 240 lb 3.2 oz (109 kg)  03/08/22 241 lb (109.3 kg)  12/29/21 235 lb (106.6 kg)   Physical Exam Vitals reviewed.  Constitutional:      General: She is not in acute distress.    Appearance: She is well-developed.  HENT:     Right Ear: Tympanic membrane, ear canal and external ear normal.     Left Ear: Tympanic membrane, ear canal and external ear normal.  Eyes:     Extraocular Movements: Extraocular movements intact.     Conjunctiva/sclera: Conjunctivae normal.  Cardiovascular:     Rate and Rhythm: Normal rate and regular rhythm.     Heart sounds: Normal heart sounds.  Pulmonary:     Effort: Pulmonary effort is normal. No respiratory distress.     Breath sounds: Normal breath sounds.  Chest:     Chest wall: No tenderness.  Abdominal:     General: Bowel sounds are normal.     Palpations: Abdomen is soft.  Musculoskeletal:        General: Normal range of motion.     Cervical back: Normal range of motion and neck supple.     Right lower leg: No edema.     Left lower leg: No edema.  Skin:    General: Skin is warm and dry.  Neurological:     Mental Status: She is  alert and oriented to person, place, and time.     Deep Tendon Reflexes: Reflexes are normal and symmetric.  Psychiatric:        Mood and Affect: Mood normal.        Behavior: Behavior normal.        Thought Content: Thought content normal.    Results for orders placed or performed in visit on 03/29/22  Lipid panel  Result Value Ref Range   Cholesterol 150 0 - 200 mg/dL   Triglycerides 74.0 0.0 - 149.0 mg/dL   HDL 53.00 >39.00 mg/dL   VLDL 14.8 0.0 - 40.0 mg/dL   LDL Cholesterol 82 0 - 99 mg/dL   Total CHOL/HDL Ratio 3    NonHDL 96.73   Comprehensive metabolic panel  Result Value Ref Range   Sodium 142 135 - 145 mEq/L   Potassium 3.4 (L) 3.5 - 5.1 mEq/L   Chloride 107 96 - 112 mEq/L   CO2 28 19 - 32 mEq/L   Glucose, Bld 96 70 - 99 mg/dL   BUN 12 6 - 23 mg/dL   Creatinine, Ser 0.85 0.40 - 1.20 mg/dL   Total Bilirubin 0.8 0.2 - 1.2 mg/dL   Alkaline Phosphatase 102 39 - 117 U/L   AST 20 0 - 37 U/L   ALT 22 0 - 35 U/L   Total Protein 7.2 6.0 - 8.3 g/dL   Albumin 3.9 3.5 - 5.2 g/dL   GFR 73.56 >60.00 mL/min   Calcium 9.0 8.4 - 10.5 mg/dL      Assessment & Plan:  Routine Health Maintenance and Physical Exam  Immunization History  Administered Date(s) Administered   PFIZER(Purple Top)SARS-COV-2 Vaccination 02/06/2020, 03/01/2020, 09/04/2020   Tdap 04/18/2017   Zoster Recombinat (Shingrix) 03/29/2022   Health Maintenance  Topic Date Due   Zoster Vaccines- Shingrix (2 of 2) 05/24/2022   INFLUENZA VACCINE  06/06/2022   MAMMOGRAM  10/10/2022   COLONOSCOPY (Pts 45-41yr Insurance coverage will need to be confirmed)  09/24/2023   PAP SMEAR-Modifier  03/08/2025   TETANUS/TDAP  04/19/2027   Hepatitis C Screening  Completed   HIV Screening  Completed   HPV VACCINES  Aged Out   COVID-19 Vaccine  Discontinued   Discussed health benefits of physical activity, and encouraged her to engage in regular exercise appropriate for her age and condition.  Problem List Items  Addressed This Visit       Cardiovascular and Mediastinum   Essential hypertension    BP at goal with amlodipine and losartan BP Readings from Last 3 Encounters:  03/29/22 122/72  03/08/22 120/72  02/05/22 139/76   Repeat CMP: normal except mild decline in potassium. Maintain potassium rich food: dark leafy vegetables, potatoes, avocado, tomatoes, almonds, dates, pumpkin seeds, raisins, banana etc.  Maintain current med doses         Nervous and Auditory   Chronic bilateral low back pain with bilateral sciatica    Improved back and hip pain with completion of PT x 7weeks and change in job.         Musculoskeletal and Integument   Hyperpigmentation    Chronic, waxing and weaning Requesting referral to dermatology       Relevant Orders   Ambulatory referral to Dermatology     Other   Dyslipidemia    Repeat lipid panel: Lipid panel: LDL at goal. Maintain atorvastatin dose      Relevant Orders   Lipid panel (Completed)   Other Visit Diagnoses     Encounter for preventative adult health care exam with abnormal findings    -  Primary   Relevant Orders   Comprehensive metabolic panel (Completed)   Need for shingles vaccine       Relevant Orders   Varicella-zoster vaccine IM (Shingrix) (Completed)      Return in about 6 months (around 09/29/2022) for HTN and , hyperlipidemia (fasting).     CWilfred Lacy NP

## 2022-03-29 NOTE — Assessment & Plan Note (Signed)
Improved back and hip pain with completion of PT x 7weeks and change in job.

## 2022-03-29 NOTE — Assessment & Plan Note (Signed)
BP at goal with amlodipine and losartan BP Readings from Last 3 Encounters:  03/29/22 122/72  03/08/22 120/72  02/05/22 139/76   Repeat CMP: normal except mild decline in potassium. Maintain potassium rich food: dark leafy vegetables, potatoes, avocado, tomatoes, almonds, dates, pumpkin seeds, raisins, banana etc.  Maintain current med doses

## 2022-03-31 LAB — LIPID PANEL
Chol/HDL Ratio: 3.1 ratio (ref 0.0–4.4)
Cholesterol, Total: 162 mg/dL (ref 100–199)
HDL: 52 mg/dL (ref >39)
LDL Chol Calc (NIH): 96 mg/dL (ref 0–99)
Triglycerides: 72 mg/dL (ref 0–149)
VLDL Cholesterol Cal: 14 mg/dL (ref 5–40)

## 2022-03-31 LAB — COMPREHENSIVE METABOLIC PANEL
ALT: 24 IU/L (ref 0–32)
AST: 23 IU/L (ref 0–40)
Albumin/Globulin Ratio: 1.4 (ref 1.2–2.2)
Albumin: 4.1 g/dL (ref 3.8–4.8)
Alkaline Phosphatase: 118 IU/L (ref 44–121)
BUN/Creatinine Ratio: 15 (ref 12–28)
BUN: 12 mg/dL (ref 8–27)
Bilirubin Total: 0.3 mg/dL (ref 0.0–1.2)
CO2: 21 mmol/L (ref 20–29)
Calcium: 9 mg/dL (ref 8.7–10.3)
Chloride: 105 mmol/L (ref 96–106)
Creatinine, Ser: 0.81 mg/dL (ref 0.57–1.00)
Globulin, Total: 2.9 g/dL (ref 1.5–4.5)
Glucose: 68 mg/dL — ABNORMAL LOW (ref 70–99)
Potassium: 4.2 mmol/L (ref 3.5–5.2)
Sodium: 143 mmol/L (ref 134–144)
Total Protein: 7 g/dL (ref 6.0–8.5)
eGFR: 82 mL/min/{1.73_m2} (ref >59)

## 2022-05-23 ENCOUNTER — Ambulatory Visit
Admission: RE | Admit: 2022-05-23 | Discharge: 2022-05-23 | Disposition: A | Discharge status: Home / Self Care | Payer: Commercial Managed Care - HMO | Source: Ambulatory Visit | Attending: Nurse Practitioner | Admitting: Nurse Practitioner

## 2022-05-23 DIAGNOSIS — N6002 Solitary cyst of left breast: Secondary | ICD-10-CM

## 2022-06-29 ENCOUNTER — Ambulatory Visit: Payer: Commercial Managed Care - HMO | Admitting: Nurse Practitioner

## 2022-06-29 ENCOUNTER — Encounter: Payer: Self-pay | Admitting: Nurse Practitioner

## 2022-06-29 VITALS — BP 122/78 | HR 62 | Temp 97.4°F | Ht 62.0 in | Wt 249.4 lb

## 2022-06-29 DIAGNOSIS — Z23 Encounter for immunization: Secondary | ICD-10-CM | POA: Diagnosis not present

## 2022-06-29 DIAGNOSIS — I10 Essential (primary) hypertension: Secondary | ICD-10-CM | POA: Diagnosis not present

## 2022-06-29 MED ORDER — LOSARTAN POTASSIUM 50 MG PO TABS: 50.0000 mg | ORAL_TABLET | Freq: Every day | ORAL | 3 refills | Status: DC

## 2022-06-29 NOTE — Assessment & Plan Note (Signed)
BP at goal with losartan and amlodipine BP Readings from Last 3 Encounters:  06/29/22 122/78  03/29/22 122/72  03/08/22 120/72   Maintain med dose Refill sent

## 2022-06-29 NOTE — Progress Notes (Signed)
Established Patient Visit  Patient: Claire Sutton   DOB: [birth year]   61 y.o. Female  MRN: [id number] Visit Date: 06/29/2022  Subjective:    Chief Complaint  Patient presents with   Office Visit    HTN/ Hyperlipidemia F/u Checks BP randomly  No concerns  Shingles vaccine given today    HPI Essential hypertension BP at goal with losartan and amlodipine BP Readings from Last 3 Encounters:  06/29/22 122/78  03/29/22 122/72  03/08/22 120/72   Maintain med dose Refill sent  Reviewed medical, surgical, and social history today  Medications: Outpatient Medications Prior to Visit  Medication Sig   albuterol (VENTOLIN HFA) 108 (90 Base) MCG/ACT inhaler INHALE 1 TO 2 PUFFS INTO THE LUNGS EVERY 6 HOURS AS NEEDED FOR WHEEZING OR SHORTNESS OF BREATH   amLODipine (NORVASC) 5 MG tablet Take 1 tablet (5 mg total) by mouth daily.   atorvastatin (LIPITOR) 20 MG tablet TAKE 1 TABLET(20 MG) BY MOUTH DAILY   Cholecalciferol (VITAMIN D PO) Take 1 tablet by mouth daily.    Cyanocobalamin (B-12) 1000 MCG CAPS Take by mouth.   Diclofenac Sodium (PENNSAID) 2 % SOLN Apply 1 inch topically 2 (two) times daily.   EPINEPHrine 0.3 mg/0.3 mL IJ SOAJ injection Inject 0.3 mg into the muscle as needed for anaphylaxis.   levocetirizine (XYZAL) 5 MG tablet Take 1 tablet (5 mg total) by mouth every evening.   montelukast (SINGULAIR) 10 MG tablet TAKE 1 TABLET(10 MG) BY MOUTH AT BEDTIME   Multiple Vitamins-Calcium (ONE-A-DAY WOMENS PO) Take 1 tablet by mouth daily.    omeprazole (PRILOSEC) 20 MG capsule TAKE 1 CAPSULE(20 MG) BY MOUTH DAILY   [DISCONTINUED] ibuprofen (ADVIL) 800 MG tablet Take 1 tablet (800 mg total) by mouth every 6 (six) hours as needed for moderate pain (swelling). (Patient taking differently: Take 600 mg by mouth every 6 (six) hours as needed for moderate pain (swelling).)   [DISCONTINUED] losartan (COZAAR) 50 MG tablet Take 1 tablet (50 mg total) by mouth daily.    [DISCONTINUED] UNABLE TO FIND Med Name: antibiotics for oral surgery (Patient not taking: Reported on 06/29/2022)   [DISCONTINUED] Zinc 50 MG CAPS Take by mouth. (Patient not taking: Reported on 06/29/2022)   No facility-administered medications prior to visit.   Reviewed past medical and social history.   ROS per HPI above  Last metabolic panel Lab Results  Component Value Date   GLUCOSE 96 03/29/2022   NA 142 03/29/2022   K 3.4 (L) 03/29/2022   CL 107 03/29/2022   CO2 28 03/29/2022   BUN 12 03/29/2022   CREATININE 0.85 03/29/2022   EGFR 82 03/29/2022   CALCIUM 9.0 03/29/2022   PROT 7.2 03/29/2022   ALBUMIN 3.9 03/29/2022   LABGLOB 2.9 03/29/2022   AGRATIO 1.4 03/29/2022   BILITOT 0.8 03/29/2022   ALKPHOS 102 03/29/2022   AST 20 03/29/2022   ALT 22 03/29/2022   Last lipids Lab Results  Component Value Date   CHOL 150 03/29/2022   HDL 53.00 03/29/2022   LDLCALC 82 03/29/2022   LDLDIRECT 104.0 10/17/2021   TRIG 74.0 03/29/2022   CHOLHDL 3 03/29/2022      Objective:  BP 122/78 (BP Location: Left Arm, Patient Position: Sitting, Cuff Size: Normal)   Pulse 62   Temp (!) 97.4 F (36.3 C) (Temporal)   Ht $R'5\' 2"'NF$  (1.575 m)   Wt 249 lb 6.4 oz (113.1  kg)   SpO2 98%   BMI 45.62 kg/m      Physical Exam Constitutional:      Appearance: She is obese.  Cardiovascular:     Rate and Rhythm: Normal rate and regular rhythm.     Pulses: Normal pulses.     Heart sounds: Normal heart sounds.  Pulmonary:     Effort: Pulmonary effort is normal.     Breath sounds: Normal breath sounds.  Musculoskeletal:     Right lower leg: No edema.     Left lower leg: No edema.  Neurological:     Mental Status: She is alert.     No results found for any visits on 06/29/22.    Assessment & Plan:    Problem List Items Addressed This Visit       Cardiovascular and Mediastinum   Essential hypertension - Primary    BP at goal with losartan and amlodipine BP Readings from Last 3  Encounters:  06/29/22 122/78  03/29/22 122/72  03/08/22 120/72   Maintain med dose Refill sent      Relevant Medications   losartan (COZAAR) 50 MG tablet   Other Visit Diagnoses     Need for shingles vaccine       Relevant Orders   Zoster Recombinant (Shingrix ) (Completed)      Return in about 6 months (around 12/30/2022) for HTN, hyperlipidemia (fasting).     Wilfred Lacy, NP

## 2022-06-29 NOTE — Patient Instructions (Signed)
Maintain current medications  

## 2022-08-06 IMAGING — MG MM DIGITAL SCREENING BILAT W/ TOMO AND CAD
8 series · 8 of 24 positions shown · non-contrast
Comparison: Previous exam(s).

CLINICAL DATA: Screening.

EXAM:
DIGITAL SCREENING BILATERAL MAMMOGRAM WITH TOMOSYNTHESIS AND CAD
TECHNIQUE: Bilateral screening digital craniocaudal and mediolateral oblique
mammograms were obtained. Bilateral screening digital breast
tomosynthesis was performed. The images were evaluated with
computer-aided detection.

[L MLO synth-2D]
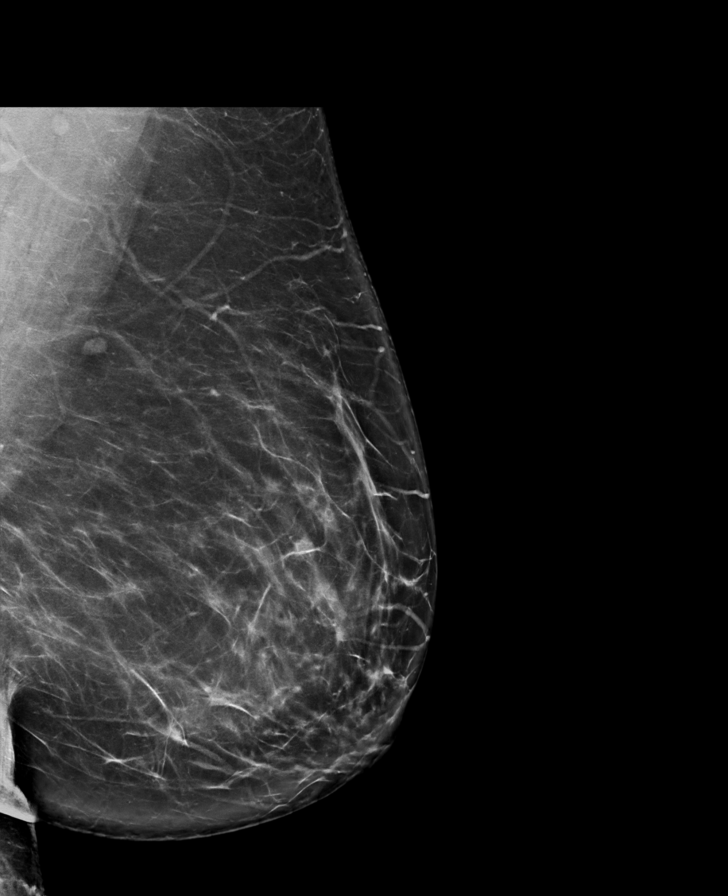

[R CC synth-2D]
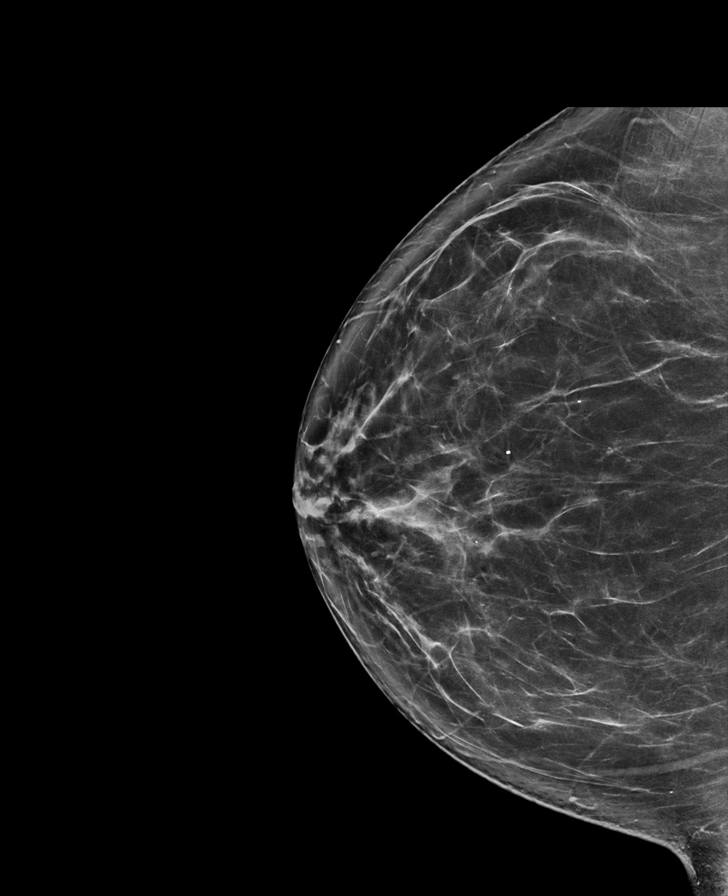

[L CC synth-2D]
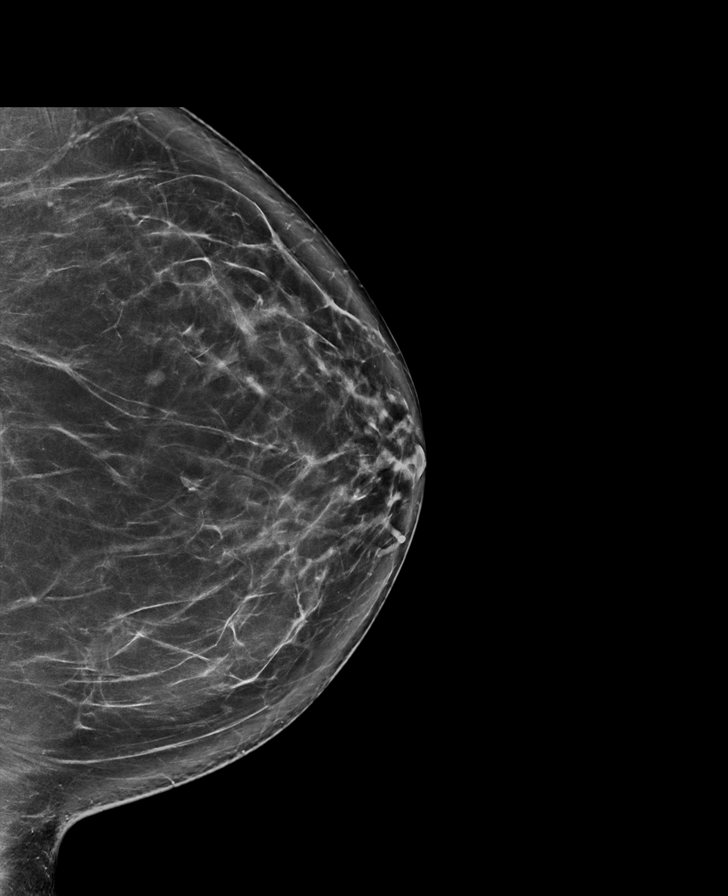

[R MLO synth-2D]
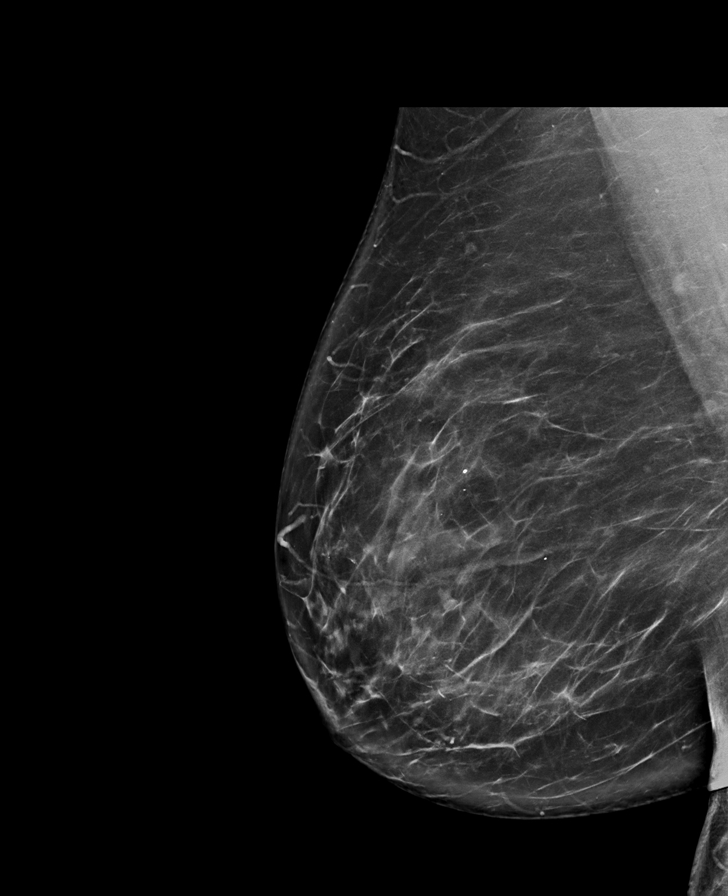

[L MLO tomo · tomo slice 48/95.0]
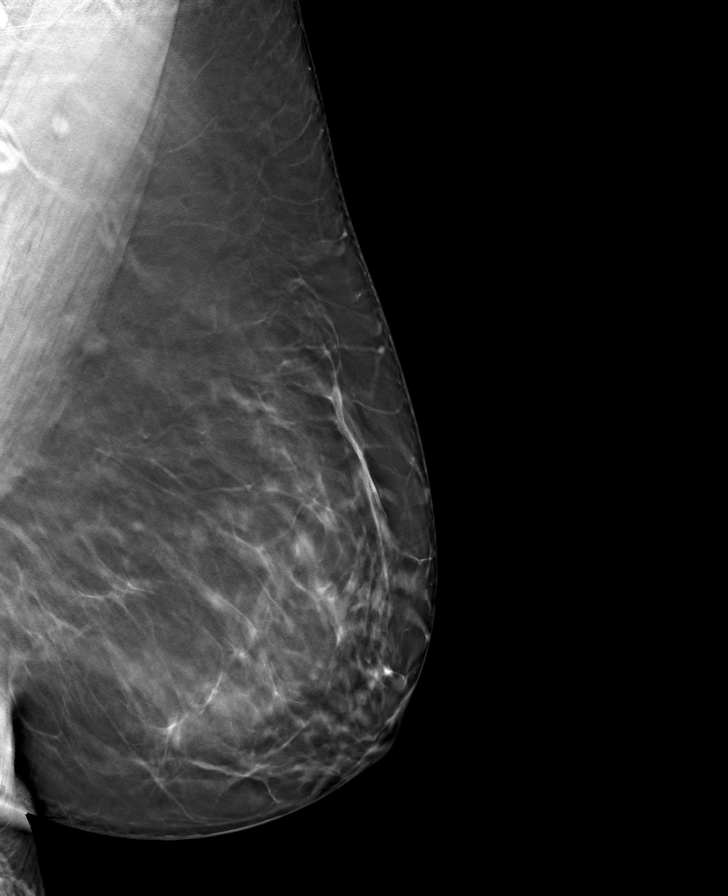

[R CC tomo · tomo slice 45/89.0]
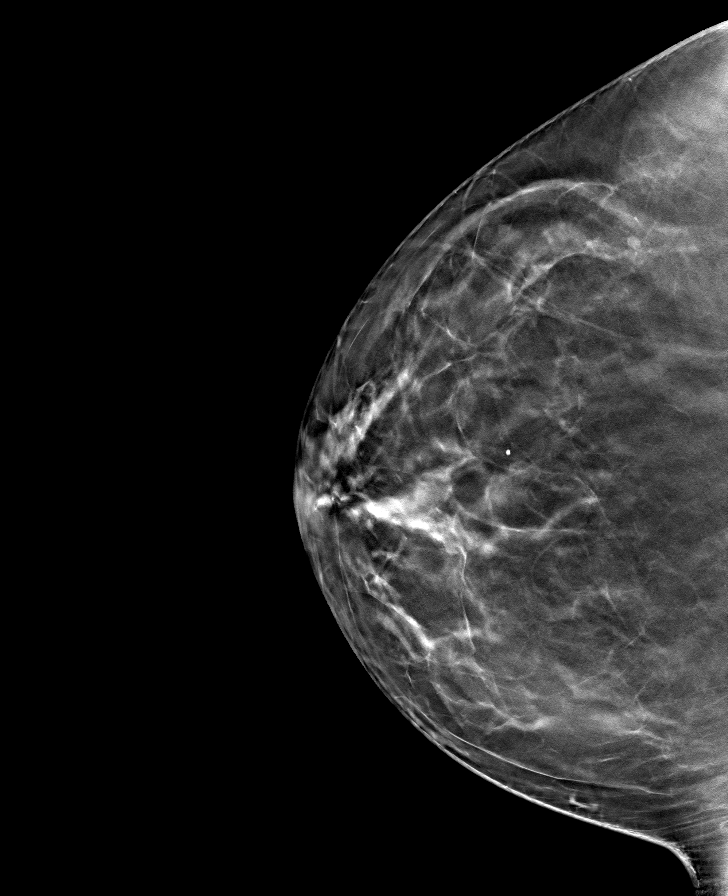

[L CC tomo · tomo slice 46/91.0]
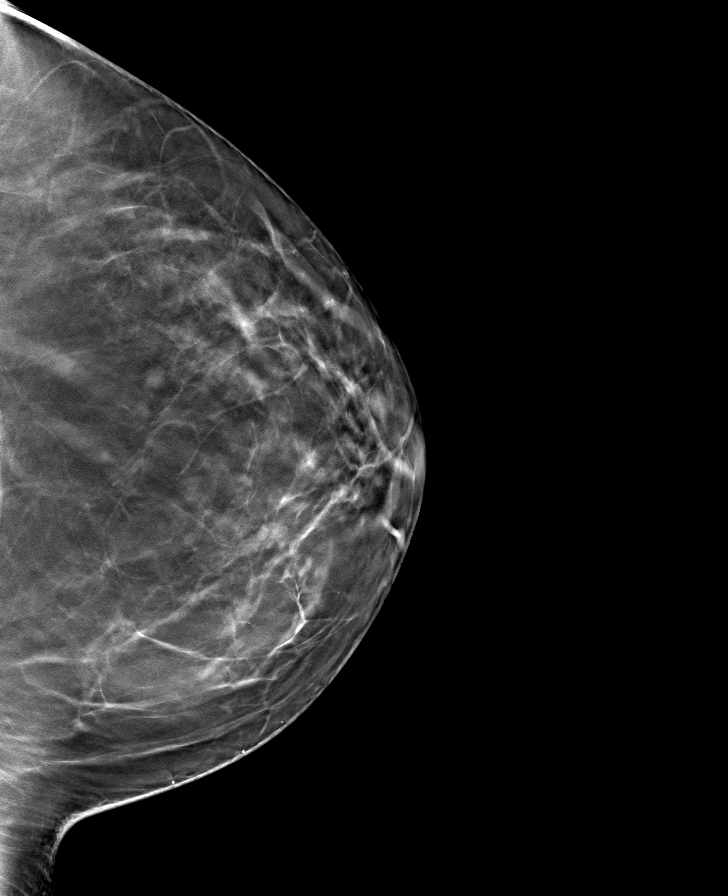

[R MLO tomo · tomo slice 49/97.0]
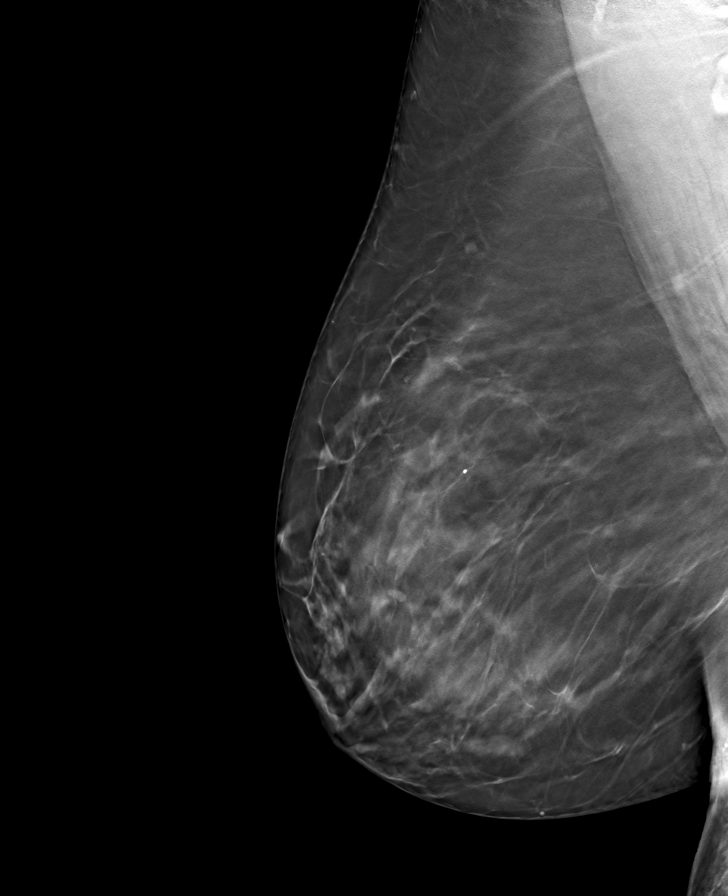

[8 of 24 positions shown; findings below may reference images not displayed]

ACR Breast Density Category c: The breast tissue is heterogeneously
dense, which may obscure small masses.
FINDINGS: In the left breast, a possible mass warrants further evaluation. In
the right breast, no findings suspicious for malignancy.
IMPRESSION: Further evaluation is suggested for a possible mass in the left
breast.

RECOMMENDATION:
Diagnostic mammogram and possibly ultrasound of the left breast.
(Code:C3-P-XXB)

The patient will be contacted regarding the findings, and additional
imaging will be scheduled.

BI-RADS CATEGORY  0: Incomplete. Need additional imaging evaluation
and/or prior mammograms for comparison.

## 2022-09-14 ENCOUNTER — Other Ambulatory Visit: Payer: Self-pay | Admitting: Nurse Practitioner

## 2022-09-14 DIAGNOSIS — Z1231 Encounter for screening mammogram for malignant neoplasm of breast: Secondary | ICD-10-CM

## 2022-10-03 ENCOUNTER — Ambulatory Visit: Payer: Commercial Managed Care - HMO | Admitting: Nurse Practitioner

## 2022-10-29 ENCOUNTER — Other Ambulatory Visit: Payer: Self-pay | Admitting: Nurse Practitioner

## 2022-10-29 DIAGNOSIS — I10 Essential (primary) hypertension: Secondary | ICD-10-CM

## 2022-10-31 NOTE — Telephone Encounter (Signed)
Chart supports Rx Last OV: 06/2022 Next OV: 12/2022

## 2022-11-06 DIAGNOSIS — H269 Unspecified cataract: Secondary | ICD-10-CM

## 2022-11-06 HISTORY — DX: Unspecified cataract: H26.9

## 2022-11-23 HISTORY — PX: CATARACT EXTRACTION, BILATERAL: SHX1313

## 2022-11-28 ENCOUNTER — Ambulatory Visit
Admission: RE | Admit: 2022-11-28 | Discharge: 2022-11-28 | Disposition: A | Discharge status: Home / Self Care | Payer: PRIVATE HEALTH INSURANCE | Source: Ambulatory Visit | Attending: Nurse Practitioner | Admitting: Nurse Practitioner

## 2022-11-28 DIAGNOSIS — Z1231 Encounter for screening mammogram for malignant neoplasm of breast: Secondary | ICD-10-CM

## 2022-12-29 ENCOUNTER — Ambulatory Visit: Payer: Commercial Managed Care - HMO | Admitting: Nurse Practitioner

## 2023-01-02 ENCOUNTER — Ambulatory Visit (INDEPENDENT_AMBULATORY_CARE_PROVIDER_SITE_OTHER): Payer: PRIVATE HEALTH INSURANCE | Admitting: Nurse Practitioner

## 2023-01-02 ENCOUNTER — Encounter: Payer: Self-pay | Admitting: Nurse Practitioner

## 2023-01-02 VITALS — BP 120/72 | HR 62 | Temp 98.3°F | Resp 16 | Ht 62.0 in | Wt 250.2 lb

## 2023-01-02 DIAGNOSIS — J4520 Mild intermittent asthma, uncomplicated: Secondary | ICD-10-CM | POA: Diagnosis not present

## 2023-01-02 DIAGNOSIS — E785 Hyperlipidemia, unspecified: Secondary | ICD-10-CM | POA: Diagnosis not present

## 2023-01-02 DIAGNOSIS — E66813 Obesity, class 3: Secondary | ICD-10-CM

## 2023-01-02 DIAGNOSIS — I10 Essential (primary) hypertension: Secondary | ICD-10-CM | POA: Diagnosis not present

## 2023-01-02 DIAGNOSIS — Z6841 Body Mass Index (BMI) 40.0 and over, adult: Secondary | ICD-10-CM

## 2023-01-02 MED ORDER — ATORVASTATIN CALCIUM 20 MG PO TABS: ORAL_TABLET | ORAL | 3 refills | Status: DC

## 2023-01-02 MED ORDER — AMLODIPINE BESYLATE 5 MG PO TABS: 5.0000 mg | ORAL_TABLET | Freq: Every day | ORAL | 3 refills | Status: DC

## 2023-01-02 MED ORDER — MONTELUKAST SODIUM 10 MG PO TABS: ORAL_TABLET | ORAL | 3 refills | Status: DC

## 2023-01-02 NOTE — Assessment & Plan Note (Addendum)
BP at goal with amlodipine and losartan BP Readings from Last 3 Encounters:  01/02/23 120/72  06/29/22 122/78  03/29/22 122/72    Maintain med dose Repeat BMP

## 2023-01-02 NOTE — Assessment & Plan Note (Signed)
Diet: low salt, low fat diet Exercise: limited in last 54monthdue to cataract surgery 15lbs weight gain in last 1year. Wt Readings from Last 3 Encounters:  01/02/23 250 lb 3.2 oz (113.5 kg)  06/29/22 249 lb 6.4 oz (113.1 kg)  03/29/22 240 lb 3.2 oz (109 kg)    Advised about importance of daily exercise and continue heart healthy diet Check Tsh  and hgbA1c today F/up in 618month

## 2023-01-02 NOTE — Progress Notes (Signed)
Established Patient Visit  Patient: Claire Sutton   DOB: 25-Oct-1961   62 y.o. Female  MRN: [id number] Visit Date: 01/02/2023  Subjective:    Chief Complaint  Patient presents with   Medical Management of Chronic Issues    HTN & Chol  'last reading was 124/76    HPI Essential hypertension BP at goal with amlodipine and losartan BP Readings from Last 3 Encounters:  01/02/23 120/72  06/29/22 122/78  03/29/22 122/72    Maintain med dose Repeat BMP  Obesity Diet: low salt, low fat diet Exercise: limited in last 70monthdue to cataract surgery 15lbs weight gain in last 1year. Wt Readings from Last 3 Encounters:  01/02/23 250 lb 3.2 oz (113.5 kg)  06/29/22 249 lb 6.4 oz (113.1 kg)  03/29/22 240 lb 3.2 oz (109 kg)    Advised about importance of daily exercise and continue heart healthy diet Check Tsh  and hgbA1c today F/up in 672month Dyslipidemia No adverse effects with atorvastatin Repeat lipid panel today    Reviewed medical, surgical, and social history today  Medications: Outpatient Medications Prior to Visit  Medication Sig   albuterol (VENTOLIN HFA) 108 (90 Base) MCG/ACT inhaler INHALE 1 TO 2 PUFFS INTO THE LUNGS EVERY 6 HOURS AS NEEDED FOR WHEEZING OR SHORTNESS OF BREATH   cetirizine (ZYRTEC) 10 MG tablet Take by mouth.   Cholecalciferol (VITAMIN D PO) Take 1 tablet by mouth daily.    Cyanocobalamin (B-12) 1000 MCG CAPS Take by mouth.   Diclofenac Sodium (PENNSAID) 2 % SOLN Apply 1 inch topically 2 (two) times daily.   EPINEPHrine 0.3 mg/0.3 mL IJ SOAJ injection Inject 0.3 mg into the muscle as needed for anaphylaxis.   levocetirizine (XYZAL) 5 MG tablet Take 1 tablet (5 mg total) by mouth every evening.   losartan (COZAAR) 50 MG tablet TAKE 1 TABLET(50 MG) BY MOUTH DAILY   Multiple Vitamins-Calcium (ONE-A-DAY WOMENS PO) Take 1 tablet by mouth daily.    omeprazole (PRILOSEC) 20 MG capsule TAKE 1 CAPSULE(20 MG) BY MOUTH DAILY   [DISCONTINUED]  amLODipine (NORVASC) 5 MG tablet Take 1 tablet (5 mg total) by mouth daily.   [DISCONTINUED] atorvastatin (LIPITOR) 20 MG tablet TAKE 1 TABLET(20 MG) BY MOUTH DAILY   [DISCONTINUED] montelukast (SINGULAIR) 10 MG tablet TAKE 1 TABLET(10 MG) BY MOUTH AT BEDTIME   No facility-administered medications prior to visit.   Reviewed past medical and social history.   ROS per HPI above      Objective:  BP 120/72 (BP Location: Left Arm, Patient Position: Sitting, Cuff Size: Normal)   Pulse 62   Temp 98.3 F (36.8 C) (Temporal)   Resp 16   Ht '5\' 2"'$  (1.575 m)   Wt 250 lb 3.2 oz (113.5 kg)   SpO2 97%   BMI 45.76 kg/m      Physical Exam Cardiovascular:     Rate and Rhythm: Normal rate and regular rhythm.     Pulses: Normal pulses.     Heart sounds: Normal heart sounds.  Pulmonary:     Effort: Pulmonary effort is normal.     Breath sounds: Normal breath sounds.  Musculoskeletal:     Right lower leg: No edema.     Left lower leg: No edema.  Neurological:     Mental Status: She is alert and oriented to person, place, and time.     No results found for any visits  on 01/02/23.    Assessment & Plan:    Problem List Items Addressed This Visit       Cardiovascular and Mediastinum   Essential hypertension - Primary    BP at goal with amlodipine and losartan BP Readings from Last 3 Encounters:  01/02/23 120/72  06/29/22 122/78  03/29/22 122/72    Maintain med dose Repeat BMP      Relevant Medications   amLODipine (NORVASC) 5 MG tablet   atorvastatin (LIPITOR) 20 MG tablet   Other Relevant Orders   Renal Function Panel     Respiratory   Mild intermittent asthma without complication   Relevant Medications   montelukast (SINGULAIR) 10 MG tablet     Other   Dyslipidemia    No adverse effects with atorvastatin Repeat lipid panel today      Relevant Medications   atorvastatin (LIPITOR) 20 MG tablet   Other Relevant Orders   Lipid panel   Obesity    Diet: low salt,  low fat diet Exercise: limited in last 43monthdue to cataract surgery 15lbs weight gain in last 1year. Wt Readings from Last 3 Encounters:  01/02/23 250 lb 3.2 oz (113.5 kg)  06/29/22 249 lb 6.4 oz (113.1 kg)  03/29/22 240 lb 3.2 oz (109 kg)    Advised about importance of daily exercise and continue heart healthy diet Check Tsh  and hgbA1c today F/up in 669month     Relevant Orders   Hemoglobin A1c   TSH   Return in about 6 months (around 07/03/2023) for CPE (fasting).     ChWilfred LacyNP

## 2023-01-02 NOTE — Assessment & Plan Note (Signed)
No adverse effects with atorvastatin Repeat lipid panel today

## 2023-01-02 NOTE — Patient Instructions (Signed)
Go to lab Maintain current medication

## 2023-01-03 LAB — LIPID PANEL
Cholesterol: 152 mg/dL (ref 0–200)
HDL: 49 mg/dL (ref >39.00)
LDL Cholesterol: 92 mg/dL (ref 0–99)
NonHDL: 102.85
Total CHOL/HDL Ratio: 3
Triglycerides: 52 mg/dL (ref 0.0–149.0)
VLDL: 10.4 mg/dL (ref 0.0–40.0)

## 2023-01-03 LAB — TSH: TSH: 1.19 u[IU]/mL (ref 0.35–5.50)

## 2023-01-03 LAB — RENAL FUNCTION PANEL
Albumin: 3.6 g/dL (ref 3.5–5.2)
BUN: 13 mg/dL (ref 6–23)
CO2: 28 mEq/L (ref 19–32)
Calcium: 9.1 mg/dL (ref 8.4–10.5)
Chloride: 105 mEq/L (ref 96–112)
Creatinine, Ser: 0.86 mg/dL (ref 0.40–1.20)
GFR: 72.14 mL/min (ref >60.00)
Glucose, Bld: 77 mg/dL (ref 70–99)
Phosphorus: 3.1 mg/dL (ref 2.3–4.6)
Potassium: 3.7 mEq/L (ref 3.5–5.1)
Sodium: 141 mEq/L (ref 135–145)

## 2023-01-03 LAB — HEMOGLOBIN A1C: Hgb A1c MFr Bld: 6.1 % (ref 4.6–6.5)

## 2023-01-03 NOTE — Progress Notes (Signed)
Abnormal: Hgb A1c at 6.1%-prediabetes Make lifestyle modifications as discussed Normal lipid panel, thyroid and renal function F/up as discussed

## 2023-01-15 ENCOUNTER — Other Ambulatory Visit: Payer: Self-pay | Admitting: Nurse Practitioner

## 2023-01-15 DIAGNOSIS — J301 Allergic rhinitis due to pollen: Secondary | ICD-10-CM

## 2023-02-24 ENCOUNTER — Other Ambulatory Visit: Payer: Self-pay | Admitting: Nurse Practitioner

## 2023-02-24 DIAGNOSIS — K219 Gastro-esophageal reflux disease without esophagitis: Secondary | ICD-10-CM

## 2023-02-26 ENCOUNTER — Other Ambulatory Visit: Payer: Self-pay

## 2023-02-26 DIAGNOSIS — K219 Gastro-esophageal reflux disease without esophagitis: Secondary | ICD-10-CM

## 2023-02-26 MED ORDER — OMEPRAZOLE 20 MG PO CPDR: 20.0000 mg | DELAYED_RELEASE_CAPSULE | Freq: Every day | ORAL | 3 refills | Status: DC

## 2023-03-04 ENCOUNTER — Other Ambulatory Visit: Payer: Self-pay | Admitting: Nurse Practitioner

## 2023-03-04 DIAGNOSIS — E785 Hyperlipidemia, unspecified: Secondary | ICD-10-CM

## 2023-04-16 ENCOUNTER — Encounter: Payer: Self-pay | Admitting: Obstetrics & Gynecology

## 2023-04-16 ENCOUNTER — Ambulatory Visit (INDEPENDENT_AMBULATORY_CARE_PROVIDER_SITE_OTHER): Payer: PRIVATE HEALTH INSURANCE | Admitting: Obstetrics & Gynecology

## 2023-04-16 VITALS — BP 116/80 | HR 65 | Ht 65.25 in | Wt 249.0 lb

## 2023-04-16 DIAGNOSIS — Z01419 Encounter for gynecological examination (general) (routine) without abnormal findings: Secondary | ICD-10-CM

## 2023-04-16 DIAGNOSIS — Z780 Asymptomatic menopausal state: Secondary | ICD-10-CM

## 2023-04-16 NOTE — Progress Notes (Signed)
Claire Sutton Mar 06, 1961 [id number]   History:    62 y.o.   G3P2A1L2 Single   RP:  Established patient presenting for annual gyn exam    HPI: Postmenopausal, well on no HRT. No PMB.  H/O endometrial ablation. Minimal hot flashes.  No pelvic pain.  Abstinent. Pap Neg 03/2022. No h/o abnormal Pap.  Breasts normal.  Mammo Neg 11/2022. Urine/BMs normal.  BMI 41.12. Health labs with Fam MD.  Treated for hypercholesterolemia. Colonoscopy 09/2018.  Had major dental work last year.  BD normal in 03/2020.   Past medical history,surgical history, family history and social history were all reviewed and documented in the EPIC chart.  Gynecologic History No LMP recorded. Patient is postmenopausal.  Obstetric History OB History  Gravida Para Term Preterm AB Living  3 2     1 2   SAB IAB Ectopic Multiple Live Births    1          # Outcome Date GA Lbr Len/2nd Weight Sex Delivery Anes PTL Lv  3 IAB           2 Para           1 Para              ROS: A ROS was performed and pertinent positives and negatives are included in the history. GENERAL: No fevers or chills. HEENT: No change in vision, no earache, sore throat or sinus congestion. NECK: No pain or stiffness. CARDIOVASCULAR: No chest pain or pressure. No palpitations. PULMONARY: No shortness of breath, cough or wheeze. GASTROINTESTINAL: No abdominal pain, nausea, vomiting or diarrhea, melena or bright red blood per rectum. GENITOURINARY: No urinary frequency, urgency, hesitancy or dysuria. MUSCULOSKELETAL: No joint or muscle pain, no back pain, no recent trauma. DERMATOLOGIC: No rash, no itching, no lesions. ENDOCRINE: No polyuria, polydipsia, no heat or cold intolerance. No recent change in weight. HEMATOLOGICAL: No anemia or easy bruising or bleeding. NEUROLOGIC: No headache, seizures, numbness, tingling or weakness. PSYCHIATRIC: No depression, no loss of interest in normal activity or change in sleep pattern.     Exam:   BP 116/80    Pulse 65   Ht 5' 5.25" (1.657 m)   Wt 249 lb (112.9 kg)   SpO2 99%   BMI 41.12 kg/m   Body mass index is 41.12 kg/m.  General appearance : Well developed well nourished female. No acute distress HEENT: Eyes: no retinal hemorrhage or exudates,  Neck supple, trachea midline, no carotid bruits, no thyroidmegaly Lungs: Clear to auscultation, no rhonchi or wheezes, or rib retractions  Heart: Regular rate and rhythm, no murmurs or gallops Breast:Examined in sitting and supine position were symmetrical in appearance, no palpable masses or tenderness,  no skin retraction, no nipple inversion, no nipple discharge, no skin discoloration, no axillary or supraclavicular lymphadenopathy Abdomen: no palpable masses or tenderness, no rebound or guarding Extremities: no edema or skin discoloration or tenderness  Pelvic: Vulva: Normal             Vagina: No gross lesions or discharge  Cervix: No gross lesions or discharge  Uterus  AV, normal size, shape and consistency, non-tender and mobile  Adnexa  Without masses or tenderness  Anus: Normal   Assessment/Plan:  62 y.o. female for annual exam   1. Well female exam with routine gynecological exam Postmenopausal, well on no HRT. No PMB.  H/O endometrial ablation. Minimal hot flashes.  No pelvic pain.  Abstinent. Pap Neg 03/2022. No h/o abnormal  Pap.  Breasts normal.  Mammo Neg 11/2022. Urine/BMs normal.  BMI 41.12. Health labs with Fam MD.  Treated for hypercholesterolemia. Colonoscopy 09/2018.  Had major dental work last year.  BD normal in 03/2020.  2. Postmenopause  Postmenopausal, well on no HRT. No PMB.  H/O endometrial ablation. Minimal hot flashes.  No pelvic pain.  Abstinent.   Genia Del MD, 3:26 PM

## 2023-05-24 ENCOUNTER — Other Ambulatory Visit: Payer: Self-pay | Admitting: Nurse Practitioner

## 2023-05-24 DIAGNOSIS — J4520 Mild intermittent asthma, uncomplicated: Secondary | ICD-10-CM

## 2023-06-05 ENCOUNTER — Other Ambulatory Visit: Payer: Self-pay | Admitting: Nurse Practitioner

## 2023-06-05 ENCOUNTER — Telehealth: Payer: Self-pay | Admitting: Nurse Practitioner

## 2023-06-05 NOTE — Telephone Encounter (Signed)
mometasone (NASONEX) 50 MCG/ACT nasal spray [[id number]]  DISCONTINUED Pt would like a refill on this spay. She stated that the pharmacy has it just waiting for your approval.  [facility] on [address].

## 2023-06-06 ENCOUNTER — Encounter (INDEPENDENT_AMBULATORY_CARE_PROVIDER_SITE_OTHER): Payer: Self-pay

## 2023-07-03 ENCOUNTER — Ambulatory Visit (INDEPENDENT_AMBULATORY_CARE_PROVIDER_SITE_OTHER): Payer: PRIVATE HEALTH INSURANCE | Admitting: Nurse Practitioner

## 2023-07-03 ENCOUNTER — Encounter: Payer: Self-pay | Admitting: Nurse Practitioner

## 2023-07-03 VITALS — BP 120/84 | HR 65 | Temp 98.3°F | Resp 16 | Ht 65.25 in | Wt 243.4 lb

## 2023-07-03 DIAGNOSIS — Z0001 Encounter for general adult medical examination with abnormal findings: Secondary | ICD-10-CM | POA: Diagnosis not present

## 2023-07-03 DIAGNOSIS — R7303 Prediabetes: Secondary | ICD-10-CM

## 2023-07-03 DIAGNOSIS — I10 Essential (primary) hypertension: Secondary | ICD-10-CM

## 2023-07-03 LAB — COMPREHENSIVE METABOLIC PANEL
ALT: 22 U/L (ref 0–35)
AST: 20 U/L (ref 0–37)
Albumin: 3.7 g/dL (ref 3.5–5.2)
Alkaline Phosphatase: 108 U/L (ref 39–117)
BUN: 13 mg/dL (ref 6–23)
CO2: 26 mEq/L (ref 19–32)
Calcium: 8.9 mg/dL (ref 8.4–10.5)
Chloride: 106 mEq/L (ref 96–112)
Creatinine, Ser: 0.89 mg/dL (ref 0.40–1.20)
GFR: 68.99 mL/min (ref >60.00)
Glucose, Bld: 103 mg/dL — ABNORMAL HIGH (ref 70–99)
Potassium: 3.7 mEq/L (ref 3.5–5.1)
Sodium: 141 mEq/L (ref 135–145)
Total Bilirubin: 1 mg/dL (ref 0.2–1.2)
Total Protein: 6.8 g/dL (ref 6.0–8.3)

## 2023-07-03 LAB — HEMOGLOBIN A1C: Hgb A1c MFr Bld: 6.1 % (ref 4.6–6.5)

## 2023-07-03 MED ORDER — LOSARTAN POTASSIUM 50 MG PO TABS: 50.0000 mg | ORAL_TABLET | Freq: Every day | ORAL | 3 refills | Status: DC

## 2023-07-03 NOTE — Progress Notes (Signed)
Complete physical exam  Patient: Claire Sutton   DOB: Mar 26, 1961   62 y.o. Female  MRN: [id number] Visit Date: 07/03/2023  Subjective:    Chief Complaint  Patient presents with   Annual Exam   MARJI MENKE is a 62 y.o. female who presents today for a complete physical exam. She reports consuming a general diet.  Cardio and strength training daily  She generally feels well. She reports sleeping well. She does not have additional problems to discuss today.  Vision:Yes Dental:Yes STD Screen:No  BP Readings from Last 3 Encounters:  07/03/23 120/84  04/16/23 116/80  01/02/23 120/72   Wt Readings from Last 3 Encounters:  07/03/23 243 lb 6.4 oz (110.4 kg)  04/16/23 249 lb (112.9 kg)  01/02/23 250 lb 3.2 oz (113.5 kg)   Most recent fall risk assessment:    07/03/2023    8:22 AM  Fall Risk   Falls in the past year? 0  Number falls in past yr: 0  Injury with Fall? 0  Risk for fall due to : No Fall Risks  Follow up Falls evaluation completed   Depression screen:Yes - No Depression Most recent depression screenings:    07/03/2023    8:23 AM 01/02/2023    1:13 PM  PHQ 2/9 Scores  PHQ - 2 Score 0 0  PHQ- 9 Score 0    HPI  No problem-specific Assessment & Plan notes found for this encounter.  Past Medical History:  Diagnosis Date   Allergy    ASCUS (atypical squamous cells of undetermined significance) on Pap smear 07/2006   NEG HIGH RISK HPV-- THEN IN 10/2006 AND 07/2007 PAPS WITH BENIGN REACTIVE CHANGES   Asthma    GERD (gastroesophageal reflux disease)    Headache    History of blood clot in brain    a. Fall age 35 - was told she had a blood clot in the brain from this.   Hyperlipidemia    Hypertension    Iritis of both eyes    LGSIL (low grade squamous intraepithelial dysplasia) 08/2004   PAP ON 02/2005, 7,8,9,10,11,12WNL   Obesity    Seizures (HCC)    a. began after she had a fall at age 36, last seizure age 3.   Past Surgical History:  Procedure  Laterality Date   CATARACT EXTRACTION, BILATERAL Bilateral 11/23/2022   COLPOSCOPY     ENDOMETRIAL ABLATION  02/2007   NOVASURE/MARSUPILIZATION OF BARTHOLIN   EYE SURGERY  11/09/2022 and 11/23/2022   Cataract surgery   MARSUPILIZATION OF BARTHOLIN  02/2007   AT TIME OF NOVASURE ABLATION SURGERY   MOUTH SURGERY     TUBAL LIGATION     Social History   Socioeconomic History   Marital status: Single    Spouse name: Not on file   Number of children: Not on file   Years of education: Not on file   Highest education level: Not on file  Occupational History   Not on file  Tobacco Use   Smoking status: Never   Smokeless tobacco: Never  Vaping Use   Vaping status: Never Used  Substance and Sexual Activity   Alcohol use: No   Drug use: No   Sexual activity: Not Currently    Birth control/protection: Post-menopausal, Surgical    Comment: 1st intercourse 62 yo-More than 5 partners, btl  Other Topics Concern   Not on file  Social History Narrative   Not on file   Social Determinants of Health  Financial Resource Strain: Not on file  Food Insecurity: Not on file  Transportation Needs: Not on file  Physical Activity: Not on file  Stress: Not on file  Social Connections: Unknown (03/17/2022)   Received from [facility]   Social Network    Social Network: Not on file  Intimate Partner Violence: Unknown (02/07/2022)   Received from [facility]   HITS    Physically Hurt: Not on file    Insult or Talk Down To: Not on file    Threaten Physical Harm: Not on file    Scream or Curse: Not on file   Family Status  Relation Name Status   Mother Mom Alive   Father Dad Deceased   Sister Sis Alive   MGM  Deceased   MGF  Deceased   PGM  Deceased   PGF [place] Deceased  No partnership data on file   Family History  Problem Relation Age of Onset   Hypertension Mother    Hyperlipidemia Mother    Heart disease Mother        was told she may have had a few MIs that she was  unaware of   Diabetes Father    Arthritis Sister        lumbar DDD with spinal stenosis   Hypertension Sister    Diabetes Paternal Grandfather    Allergies  Allergen Reactions   Codeine     COLD SWEAT, LIGHT-HEADEDNESS. Cold sweats   Cyclobenzaprine Rash    Patient Care Team: Jasmane Brockway, Bonna Gains, NP as PCP - General (Internal Medicine)   Medications: Outpatient Medications Prior to Visit  Medication Sig   albuterol (VENTOLIN HFA) 108 (90 Base) MCG/ACT inhaler INHALE 1 TO 2 PUFFS INTO THE LUNGS EVERY 6 HOURS AS NEEDED FOR WHEEZING OR SHORTNESS OF BREATH   amLODipine (NORVASC) 5 MG tablet Take 1 tablet (5 mg total) by mouth daily.   atorvastatin (LIPITOR) 20 MG tablet TAKE 1 TABLET(20 MG) BY MOUTH DAILY   cetirizine (ZYRTEC) 10 MG tablet Take by mouth.   Cholecalciferol (VITAMIN D PO) Take 1 tablet by mouth daily.    Cyanocobalamin (B-12) 1000 MCG CAPS Take by mouth.   Diclofenac Sodium (PENNSAID) 2 % SOLN Apply 1 inch topically 2 (two) times daily.   EPINEPHrine 0.3 mg/0.3 mL IJ SOAJ injection Inject 0.3 mg into the muscle as needed for anaphylaxis.   mometasone (NASONEX 24HR) 50 MCG/ACT nasal spray PLACE 2 SPRAYS INTO NOSE DAILY   montelukast (SINGULAIR) 10 MG tablet TAKE 1 TABLET(10 MG) BY MOUTH AT BEDTIME   Multiple Vitamins-Calcium (ONE-A-DAY WOMENS PO) Take 1 tablet by mouth daily.    omeprazole (PRILOSEC) 20 MG capsule TAKE 1 CAPSULE(20 MG) BY MOUTH DAILY   [DISCONTINUED] losartan (COZAAR) 50 MG tablet TAKE 1 TABLET(50 MG) BY MOUTH DAILY   [DISCONTINUED] omeprazole (PRILOSEC) 20 MG capsule Take 1 capsule (20 mg total) by mouth daily.   No facility-administered medications prior to visit.    Review of Systems  Constitutional:  Negative for activity change, appetite change and unexpected weight change.  Respiratory: Negative.    Cardiovascular: Negative.   Gastrointestinal: Negative.   Endocrine: Negative for cold intolerance and heat intolerance.  Genitourinary:  Negative.   Musculoskeletal: Negative.   Skin: Negative.   Neurological: Negative.   Hematological: Negative.   Psychiatric/Behavioral:  Negative for behavioral problems, decreased concentration, dysphoric mood, hallucinations, self-injury, sleep disturbance and suicidal ideas. The patient is not nervous/anxious.         Objective:  BP 120/84 (BP Location: Left Arm, Patient Position: Sitting, Cuff Size: Large)   Pulse 65   Temp 98.3 F (36.8 C) (Temporal)   Resp 16   Ht 5' 5.25" (1.657 m)   Wt 243 lb 6.4 oz (110.4 kg)   SpO2 95%   BMI 40.19 kg/m     Physical Exam Vitals and nursing note reviewed.  Constitutional:      General: She is not in acute distress. HENT:     Right Ear: Tympanic membrane, ear canal and external ear normal.     Left Ear: Tympanic membrane, ear canal and external ear normal.     Nose: Nose normal.  Eyes:     Extraocular Movements: Extraocular movements intact.     Conjunctiva/sclera: Conjunctivae normal.     Pupils: Pupils are equal, round, and reactive to light.  Neck:     Thyroid: No thyroid mass, thyromegaly or thyroid tenderness.  Cardiovascular:     Rate and Rhythm: Normal rate and regular rhythm.     Pulses: Normal pulses.     Heart sounds: Normal heart sounds.  Pulmonary:     Effort: Pulmonary effort is normal.     Breath sounds: Normal breath sounds.  Abdominal:     General: Bowel sounds are normal.     Palpations: Abdomen is soft.  Musculoskeletal:        General: Normal range of motion.     Cervical back: Normal range of motion and neck supple.     Right lower leg: No edema.     Left lower leg: No edema.  Lymphadenopathy:     Cervical: No cervical adenopathy.  Skin:    General: Skin is warm and dry.  Neurological:     Mental Status: She is alert and oriented to person, place, and time.     Cranial Nerves: No cranial nerve deficit.  Psychiatric:        Mood and Affect: Mood normal.        Behavior: Behavior normal.         Thought Content: Thought content normal.     No results found for any visits on 07/03/23.    Assessment & Plan:    Routine Health Maintenance and Physical Exam  Immunization History  Administered Date(s) Administered   PFIZER(Purple Top)SARS-COV-2 Vaccination 02/06/2020, 03/01/2020, 09/04/2020   Tdap 04/18/2017   Zoster Recombinant(Shingrix) 03/29/2022, 06/29/2022   Health Maintenance  Topic Date Due   Colonoscopy  09/24/2023   INFLUENZA VACCINE  02/04/2024 (Originally 06/07/2023)   MAMMOGRAM  11/29/2023   PAP SMEAR-Modifier  03/08/2025   DTaP/Tdap/Td (2 - Td or Tdap) 04/19/2027   Hepatitis C Screening  Completed   HIV Screening  Completed   Zoster Vaccines- Shingrix  Completed   HPV VACCINES  Aged Out   COVID-19 Vaccine  Discontinued   Discussed health benefits of physical activity, and encouraged her to engage in regular exercise appropriate for her age and condition. Advised to schedule appointment for repeat colonoscopy in November  Problem List Items Addressed This Visit     Essential hypertension   Relevant Medications   losartan (COZAAR) 50 MG tablet   Prediabetes   Relevant Orders   Hemoglobin A1c   Other Visit Diagnoses     Encounter for preventative adult health care exam with abnormal findings    -  Primary   Relevant Orders   Comprehensive metabolic panel      Return in about 6 months (around 01/03/2024) for HTN, prediabetes,  hyperlipidemia (fasting).     Alysia Penna, NP

## 2023-07-03 NOTE — Patient Instructions (Signed)
Schedule for repeat colonoscopy in November: [phone] Go to lab Continue Heart healthy diet and daily exercise. Maintain current medications.  Preventive Care 63-63 Years Old, Female Preventive care refers to lifestyle choices and visits with your health care provider that can promote health and wellness. Preventive care visits are also called wellness exams. What can I expect for my preventive care visit? Counseling Your health care provider may ask you questions about your: Medical history, including: Past medical problems. Family medical history. Pregnancy history. Current health, including: Menstrual cycle. Method of birth control. Emotional well-being. Home life and relationship well-being. Sexual activity and sexual health. Lifestyle, including: Alcohol, nicotine or tobacco, and drug use. Access to firearms. Diet, exercise, and sleep habits. Work and work Astronomer. Sunscreen use. Safety issues such as seatbelt and bike helmet use. Physical exam Your health care provider will check your: Height and weight. These may be used to calculate your BMI (body mass index). BMI is a measurement that tells if you are at a healthy weight. Waist circumference. This measures the distance around your waistline. This measurement also tells if you are at a healthy weight and may help predict your risk of certain diseases, such as type 2 diabetes and high blood pressure. Heart rate and blood pressure. Body temperature. Skin for abnormal spots. What immunizations do I need?  Vaccines are usually given at various ages, according to a schedule. Your health care provider will recommend vaccines for you based on your age, medical history, and lifestyle or other factors, such as travel or where you work. What tests do I need? Screening Your health care provider may recommend screening tests for certain conditions. This may include: Lipid and cholesterol levels. Diabetes screening. This  is done by checking your blood sugar (glucose) after you have not eaten for a while (fasting). Pelvic exam and Pap test. Hepatitis B test. Hepatitis C test. HIV (human immunodeficiency virus) test. STI (sexually transmitted infection) testing, if you are at risk. Lung cancer screening. Colorectal cancer screening. Mammogram. Talk with your health care provider about when you should start having regular mammograms. This may depend on whether you have a family history of breast cancer. BRCA-related cancer screening. This may be done if you have a family history of breast, ovarian, tubal, or peritoneal cancers. Bone density scan. This is done to screen for osteoporosis. Talk with your health care provider about your test results, treatment options, and if necessary, the need for more tests. Follow these instructions at home: Eating and drinking  Eat a diet that includes fresh fruits and vegetables, whole grains, lean protein, and low-fat dairy products. Take vitamin and mineral supplements as recommended by your health care provider. Do not drink alcohol if: Your health care provider tells you not to drink. You are pregnant, may be pregnant, or are planning to become pregnant. If you drink alcohol: Limit how much you have to 0-1 drink a day. Know how much alcohol is in your drink. In the [place] one drink equals one 12 oz bottle of beer (355 mL), one 5 oz glass of wine (148 mL), or one 1 oz glass of hard liquor (44 mL). Lifestyle Brush your teeth every morning and night with fluoride toothpaste. Floss one time each day. Exercise for at least 30 minutes 5 or more days each week. Do not use any products that contain nicotine or tobacco. These products include cigarettes, chewing tobacco, and vaping devices, such as e-cigarettes. If you need help quitting, ask your health  care provider. Do not use drugs. If you are sexually active, practice safe sex. Use a condom or other form of protection to  prevent STIs. If you do not wish to become pregnant, use a form of birth control. If you plan to become pregnant, see your health care provider for a prepregnancy visit. Take aspirin only as told by your health care provider. Make sure that you understand how much to take and what form to take. Work with your health care provider to find out whether it is safe and beneficial for you to take aspirin daily. Find healthy ways to manage stress, such as: Meditation, yoga, or listening to music. Journaling. Talking to a trusted person. Spending time with friends and family. Minimize exposure to UV radiation to reduce your risk of skin cancer. Safety Always wear your seat belt while driving or riding in a vehicle. Do not drive: If you have been drinking alcohol. Do not ride with someone who has been drinking. When you are tired or distracted. While texting. If you have been using any mind-altering substances or drugs. Wear a helmet and other protective equipment during sports activities. If you have firearms in your house, make sure you follow all gun safety procedures. Seek help if you have been physically or sexually abused. What's next? Visit your health care provider once a year for an annual wellness visit. Ask your health care provider how often you should have your eyes and teeth checked. Stay up to date on all vaccines. This information is not intended to replace advice given to you by your health care provider. Make sure you discuss any questions you have with your health care provider. Document Revised: 04/20/2021 Document Reviewed: 04/20/2021 Elsevier Patient Education  2024 [employer].

## 2023-07-04 ENCOUNTER — Encounter: Payer: Self-pay | Admitting: Gastroenterology

## 2023-08-20 ENCOUNTER — Ambulatory Visit (AMBULATORY_SURGERY_CENTER): Payer: PRIVATE HEALTH INSURANCE

## 2023-08-20 VITALS — Ht 65.0 in | Wt 237.0 lb

## 2023-08-20 DIAGNOSIS — Z860100 Personal history of colon polyps, unspecified: Secondary | ICD-10-CM

## 2023-08-20 NOTE — Progress Notes (Signed)
No egg allergy known to patient . Soy sensativity No issues known to pt with past sedation with any surgeries or procedures Patient denies ever being told they had issues or difficulty with intubation  No FH of Malignant Hyperthermia Pt is not on diet pills Pt is not on  home 02  Pt is not on blood thinners  Pt denies issues with constipation  No A fib or A flutter Have any cardiac testing pending--no Pt can ambulate independently Pt denies use of chewing tobacco Discussed diabetic I weight loss medication holds Discussed NSAID holds Checked BMI Pt instructed to use Single[employer]com or GoodRx for a price reduction on prep  Patient's chart reviewed by Cathlyn Parsons CNRA prior to previsit and patient appropriate for the [facility].  Pre visit completed and red dot placed by patient's name on their procedure day (on provider's schedule).

## 2023-08-28 ENCOUNTER — Telehealth: Payer: Self-pay | Admitting: Gastroenterology

## 2023-08-28 NOTE — Telephone Encounter (Signed)
Inbound call from patient needing prep instruction

## 2023-08-28 NOTE — Telephone Encounter (Addendum)
Prep instructions have been reprinted and will be mailed to address on file. Prep instructions can also be found on mychart under the communications tab via "letters". Attempt to call patient to make her aware. Unable to leave a message. Patient can opt to come to the office to pick up instructions from the 2nd floor lobby if this works better for her.

## 2023-08-29 ENCOUNTER — Other Ambulatory Visit: Payer: Self-pay

## 2023-08-29 DIAGNOSIS — Z860100 Personal history of colon polyps, unspecified: Secondary | ICD-10-CM

## 2023-08-29 MED ORDER — NA SULFATE-K SULFATE-MG SULF 17.5-3.13-1.6 GM/177ML PO SOLN: 1.0000 | Freq: Once | ORAL | 0 refills | Status: AC

## 2023-08-29 NOTE — Telephone Encounter (Signed)
Rx sent to requested pharmacy attempted to call and make patient aware unable to leave a message

## 2023-08-29 NOTE — Telephone Encounter (Signed)
Inbound call from patient stating that she has her instructions, she needs her prep medication called in to [facility] on [address]. Please advise.

## 2023-08-31 ENCOUNTER — Encounter: Payer: Self-pay | Admitting: Gastroenterology

## 2023-09-07 ENCOUNTER — Encounter: Payer: Self-pay | Admitting: Gastroenterology

## 2023-09-07 ENCOUNTER — Ambulatory Visit (AMBULATORY_SURGERY_CENTER): Payer: PRIVATE HEALTH INSURANCE | Admitting: Gastroenterology

## 2023-09-07 VITALS — BP 136/90 | HR 54 | Temp 98.6°F | Resp 12 | Ht 65.25 in | Wt 237.0 lb

## 2023-09-07 DIAGNOSIS — D123 Benign neoplasm of transverse colon: Secondary | ICD-10-CM

## 2023-09-07 DIAGNOSIS — Z860100 Personal history of colon polyps, unspecified: Secondary | ICD-10-CM | POA: Diagnosis not present

## 2023-09-07 DIAGNOSIS — Z09 Encounter for follow-up examination after completed treatment for conditions other than malignant neoplasm: Secondary | ICD-10-CM

## 2023-09-07 DIAGNOSIS — D122 Benign neoplasm of ascending colon: Secondary | ICD-10-CM

## 2023-09-07 MED ORDER — SODIUM CHLORIDE 0.9 % IV SOLN: 500.0000 mL | Freq: Once | INTRAVENOUS | Status: DC

## 2023-09-07 NOTE — Op Note (Signed)
[facility] Patient Name: Claire Sutton Procedure Date: 09/07/2023 7:54 AM MRN: [id number] Endoscopist: Lorin Picket E. Tomasa Rand , MD, [id number] Age: 62 Referring MD:  Date of Birth: 1961-07-22 Gender: Female Account #: [id number] Procedure:                Colonoscopy Indications:              High risk colon cancer surveillance: Personal                            history of adenoma less than 10 mm in size Medicines:                Monitored Anesthesia Care Procedure:                Pre-Anesthesia Assessment:                           - Prior to the procedure, a History and Physical                            was performed, and patient medications and                            allergies were reviewed. The patient's tolerance of                            previous anesthesia was also reviewed. The risks                            and benefits of the procedure and the sedation                            options and risks were discussed with the patient.                            All questions were answered, and informed consent                            was obtained. Prior Anticoagulants: The patient has                            taken no anticoagulant or antiplatelet agents. ASA                            Grade Assessment: II - A patient with mild systemic                            disease. After reviewing the risks and benefits,                            the patient was deemed in satisfactory condition to                            undergo the procedure.  After obtaining informed consent, the colonoscope                            was passed under direct vision. Throughout the                            procedure, the patient's blood pressure, pulse, and                            oxygen saturations were monitored continuously. The                            Olympus Scope SN: [id number] was introduced through                            the anus and  advanced to the the cecum, identified                            by appendiceal orifice and ileocecal valve. The                            colonoscopy was somewhat difficult due to                            significant looping. Successful completion of the                            procedure was aided by using manual pressure. The                            patient tolerated the procedure well. The quality                            of the bowel preparation was excellent. The                            ileocecal valve, appendiceal orifice, and rectum                            were photographed. The bowel preparation used was                            SUPREP via split dose instruction. Scope In: 7:59:17 AM Scope Out: 8:16:11 AM Scope Withdrawal Time: 0 hours 10 minutes 13 seconds  Total Procedure Duration: 0 hours 16 minutes 54 seconds  Findings:                 The perianal and digital rectal examinations were                            normal. Pertinent negatives include normal                            sphincter tone and no palpable rectal lesions.  Two sessile polyps were found in the ascending                            colon. The polyps were 3 to 4 mm in size. These                            polyps were removed with a cold snare. Resection                            and retrieval were complete. Estimated blood loss                            was minimal.                           A 5 mm polyp was found in the transverse colon. The                            polyp was flat. The polyp was removed with a cold                            snare. Resection and retrieval were complete.                            Estimated blood loss was minimal.                           A few small-mouthed diverticula were found in the                            sigmoid colon and transverse colon.                           The exam was otherwise normal throughout the                             examined colon.                           The retroflexed view of the distal rectum and anal                            verge was normal and showed no anal or rectal                            abnormalities. Complications:            No immediate complications. Estimated Blood Loss:     Estimated blood loss: none. Estimated blood loss                            was minimal. Impression:               - Two 3 to 4 mm polyps in the ascending colon,  removed with a cold snare. Resected and retrieved.                           - One 5 mm polyp in the transverse colon, removed                            with a cold snare. Resected and retrieved.                           - Mild diverticulosis in the sigmoid colon and in                            the transverse colon.                           - The distal rectum and anal verge are normal on                            retroflexion view. Recommendation:           - Patient has a contact number available for                            emergencies. The signs and symptoms of potential                            delayed complications were discussed with the                            patient. Return to normal activities tomorrow.                            Written discharge instructions were provided to the                            patient.                           - Resume previous diet.                           - Continue present medications.                           - Await pathology results.                           - Repeat colonoscopy (date not yet determined) for                            surveillance based on pathology results. Jerrilynn Mikowski E. Tomasa Rand, MD 09/07/2023 8:23:07 AM This report has been signed electronically.

## 2023-09-07 NOTE — Progress Notes (Signed)
Called to room to assist during endoscopic procedure.  Patient ID and intended procedure confirmed with present staff. Received instructions for my participation in the procedure from the performing physician.  

## 2023-09-07 NOTE — Progress Notes (Signed)
Sedate, gd SR, tolerated procedure well, VSS, report to RN 

## 2023-09-07 NOTE — Progress Notes (Signed)
[facility] History and Physical   Primary Care Physician:  Nche, Bonna Gains, NP   Reason for Procedure:   History of colon polyps  Plan:    Colonoscopy     HPI: Claire Sutton is a 62 y.o. female undergoing surveillance colonoscopy.  She has no family history of colon cancer and no chronic GI symptoms.   Her last colonoscopy was in Nov 2019 by Dr. Orvan Falconer and a small sigmoid hyperplastic polyp was removed.  She had a small tubular adenoma removed by Dr.  Dulce Sellar in 2014.   Past Medical History:  Diagnosis Date   Allergy    ASCUS (atypical squamous cells of undetermined significance) on Pap smear 07/2006   NEG HIGH RISK HPV-- THEN IN 10/2006 AND 07/2007 PAPS WITH BENIGN REACTIVE CHANGES   Asthma    Cataract 2024   bilateral   GERD (gastroesophageal reflux disease)    Headache    History of blood clot in brain    a. Fall age 60 - was told she had a blood clot in the brain from this.   Hyperlipidemia    Hypertension    Iritis of both eyes    LGSIL (low grade squamous intraepithelial dysplasia) 08/2004   PAP ON 02/2005, 7,8,9,10,11,12WNL   Obesity    Seizures (HCC)    a. began after she had a fall at age 71, last seizure age 62.    Past Surgical History:  Procedure Laterality Date   CATARACT EXTRACTION, BILATERAL Bilateral 11/23/2022   COLPOSCOPY     ENDOMETRIAL ABLATION  02/2007   NOVASURE/MARSUPILIZATION OF BARTHOLIN   EYE SURGERY  11/09/2022 and 11/23/2022   Cataract surgery   MARSUPILIZATION OF BARTHOLIN  02/2007   AT TIME OF NOVASURE ABLATION SURGERY   MOUTH SURGERY     TUBAL LIGATION      Prior to Admission medications   Medication Sig Start Date End Date Taking? Authorizing Provider  amLODipine (NORVASC) 5 MG tablet Take 1 tablet (5 mg total) by mouth daily. 01/02/23  Yes Nche, Bonna Gains, NP  atorvastatin (LIPITOR) 20 MG tablet TAKE 1 TABLET(20 MG) BY MOUTH DAILY 03/05/23  Yes Nche, Bonna Gains, NP  cetirizine (ZYRTEC) 10 MG tablet Take by  mouth. 12/03/15  Yes [provider]  Cholecalciferol (VITAMIN D PO) Take 1 tablet by mouth daily.    Yes [provider]  Cyanocobalamin (B-12) 1000 MCG CAPS Take by mouth.   Yes [provider]  losartan (COZAAR) 50 MG tablet Take 1 tablet (50 mg total) by mouth daily. 07/03/23  Yes Nche, Bonna Gains, NP  mometasone (NASONEX 24HR) 50 MCG/ACT nasal spray PLACE 2 SPRAYS INTO NOSE DAILY 06/05/23  Yes Nche, Bonna Gains, NP  montelukast (SINGULAIR) 10 MG tablet TAKE 1 TABLET(10 MG) BY MOUTH AT BEDTIME 01/02/23  Yes Nche, Bonna Gains, NP  Multiple Vitamins-Calcium (ONE-A-DAY WOMENS PO) Take 1 tablet by mouth daily.    Yes [provider]  omeprazole (PRILOSEC) 20 MG capsule TAKE 1 CAPSULE(20 MG) BY MOUTH DAILY 02/26/23  Yes Nche, Bonna Gains, NP  Zinc Sulfate (ZINC-220 PO) Take by mouth.   Yes [provider]  albuterol (VENTOLIN HFA) 108 (90 Base) MCG/ACT inhaler INHALE 1 TO 2 PUFFS INTO THE LUNGS EVERY 6 HOURS AS NEEDED FOR WHEEZING OR SHORTNESS OF BREATH 05/24/23   Nche, Bonna Gains, NP  Diclofenac Sodium (PENNSAID) 2 % SOLN Apply 1 inch topically 2 (two) times daily. Patient not taking: Reported on 08/20/2023 10/21/20   Hyman Hopes,  Padonda B, FNP  EPINEPHrine 0.3 mg/0.3 mL IJ SOAJ injection Inject 0.3 mg into the muscle as needed for anaphylaxis. Patient not taking: Reported on 09/07/2023 05/27/21   Anne Ng, NP    Current Outpatient Medications  Medication Sig Dispense Refill   amLODipine (NORVASC) 5 MG tablet Take 1 tablet (5 mg total) by mouth daily. 90 tablet 3   atorvastatin (LIPITOR) 20 MG tablet TAKE 1 TABLET(20 MG) BY MOUTH DAILY 90 tablet 3   cetirizine (ZYRTEC) 10 MG tablet Take by mouth.     Cholecalciferol (VITAMIN D PO) Take 1 tablet by mouth daily.      Cyanocobalamin (B-12) 1000 MCG CAPS Take by mouth.     losartan (COZAAR) 50 MG tablet Take 1 tablet (50 mg total) by mouth daily. 90 tablet 3   mometasone (NASONEX 24HR) 50  MCG/ACT nasal spray PLACE 2 SPRAYS INTO NOSE DAILY 1 each 0   montelukast (SINGULAIR) 10 MG tablet TAKE 1 TABLET(10 MG) BY MOUTH AT BEDTIME 90 tablet 3   Multiple Vitamins-Calcium (ONE-A-DAY WOMENS PO) Take 1 tablet by mouth daily.      omeprazole (PRILOSEC) 20 MG capsule TAKE 1 CAPSULE(20 MG) BY MOUTH DAILY 90 capsule 3   Zinc Sulfate (ZINC-220 PO) Take by mouth.     albuterol (VENTOLIN HFA) 108 (90 Base) MCG/ACT inhaler INHALE 1 TO 2 PUFFS INTO THE LUNGS EVERY 6 HOURS AS NEEDED FOR WHEEZING OR SHORTNESS OF BREATH 6.7 g 1   Diclofenac Sodium (PENNSAID) 2 % SOLN Apply 1 inch topically 2 (two) times daily. (Patient not taking: Reported on 08/20/2023) 112 g 1   EPINEPHrine 0.3 mg/0.3 mL IJ SOAJ injection Inject 0.3 mg into the muscle as needed for anaphylaxis. (Patient not taking: Reported on 09/07/2023) 1 each 1   Current Facility-Administered Medications  Medication Dose Route Frequency Provider Last Rate Last Admin   0.9 %  sodium chloride infusion  500 mL Intravenous Once Jenel Lucks, MD        Allergies as of 09/07/2023 - Review Complete 09/07/2023  Allergen Reaction Noted   Codeine  07/28/2011   Cyclobenzaprine Rash 10/17/2021    Family History  Problem Relation Age of Onset   Hypertension Mother    Hyperlipidemia Mother    Heart disease Mother        was told she may have had a few MIs that she was unaware of   Diabetes Father    Arthritis Sister        lumbar DDD with spinal stenosis   Hypertension Sister    Diabetes Paternal Grandfather    Colon cancer Neg Hx    Colon polyps Neg Hx    Esophageal cancer Neg Hx    Stomach cancer Neg Hx    Rectal cancer Neg Hx     Social History   Socioeconomic History   Marital status: Single    Spouse name: Not on file   Number of children: Not on file   Years of education: Not on file   Highest education level: Not on file  Occupational History   Not on file  Tobacco Use   Smoking status: Never   Smokeless tobacco:  Never  Vaping Use   Vaping status: Never Used  Substance and Sexual Activity   Alcohol use: No   Drug use: No   Sexual activity: Not Currently    Birth control/protection: Post-menopausal, Surgical    Comment: 1st intercourse 62 yo-More than 5 partners, btl  Other Topics  Concern   Not on file  Social History Narrative   Not on file   Social Determinants of Health   Financial Resource Strain: Not on file  Food Insecurity: Not on file  Transportation Needs: Not on file  Physical Activity: Not on file  Stress: Not on file  Social Connections: Unknown (03/17/2022)   Received from [facility], [facility]   Social Network    Social Network: Not on file  Intimate Partner Violence: Unknown (02/07/2022)   [facility] from [facility], [facility]   HITS    Physically Hurt: Not on file    Insult or Talk Down To: Not on file    Threaten Physical Harm: Not on file    Scream or Curse: Not on file    Review of Systems:  All other review of systems negative except as mentioned in the HPI.  Physical Exam: Vital signs BP 128/84   Pulse 65   Temp 98.6 F (37 C) (Skin)   Ht 5' 5.25" (1.657 m)   Wt 237 lb (107.5 kg)   SpO2 96%   BMI 39.14 kg/m   General:   Alert,  Well-developed, well-nourished, pleasant and cooperative in NAD Airway:  Mallampati 2 Lungs:  Clear throughout to auscultation.   Heart:  Regular rate and rhythm; no murmurs, clicks, rubs,  or gallops. Abdomen:  Soft, nontender and nondistended. Normal bowel sounds.   Neuro/Psych:  Normal mood and affect. A and O x 3   Claire Sutton E. Tomasa Rand, MD [facility]

## 2023-09-07 NOTE — Patient Instructions (Addendum)

## 2023-09-07 NOTE — Progress Notes (Signed)
Pt's states no medical or surgical changes since previsit or office visit. VS assessed by AS 

## 2023-09-10 ENCOUNTER — Telehealth: Payer: Self-pay

## 2023-09-10 NOTE — Telephone Encounter (Signed)
Follow up call to pt; phone rang multiple times with no answer, then went to a busy signal; unable to leave vm.

## 2023-09-12 LAB — SURGICAL PATHOLOGY

## 2023-09-13 NOTE — Progress Notes (Signed)
Claire Sutton,  Two polyps which I removed during your recent procedure were proven to be completely benign but are considered "pre-cancerous" polyps that MAY have grown into cancer if they had not been removed.  The third polyp was not precancerous.  Studies shows that at least 20% of women over age 63 and 30% of men over age 47 have pre-cancerous polyps.  Based on current nationally recognized surveillance guidelines, I recommend that you have a repeat colonoscopy in 7 years.   If you develop any new rectal bleeding, abdominal pain or significant bowel habit changes, please contact me before then.

## 2023-12-12 HISTORY — PX: RETINAL LASER PROCEDURE: SHX2339

## 2023-12-31 ENCOUNTER — Other Ambulatory Visit: Payer: Self-pay | Admitting: Nurse Practitioner

## 2023-12-31 DIAGNOSIS — Z1231 Encounter for screening mammogram for malignant neoplasm of breast: Secondary | ICD-10-CM

## 2024-01-04 ENCOUNTER — Ambulatory Visit: Payer: PRIVATE HEALTH INSURANCE | Admitting: Nurse Practitioner

## 2024-01-08 ENCOUNTER — Ambulatory Visit
Admission: RE | Admit: 2024-01-08 | Discharge: 2024-01-08 | Disposition: A | Discharge status: Home / Self Care | Payer: PRIVATE HEALTH INSURANCE | Source: Ambulatory Visit | Attending: Nurse Practitioner

## 2024-01-08 DIAGNOSIS — Z1231 Encounter for screening mammogram for malignant neoplasm of breast: Secondary | ICD-10-CM

## 2024-01-09 ENCOUNTER — Encounter: Payer: Self-pay | Admitting: Nurse Practitioner

## 2024-01-09 ENCOUNTER — Ambulatory Visit: Payer: PRIVATE HEALTH INSURANCE | Admitting: Nurse Practitioner

## 2024-01-09 VITALS — BP 138/80 | HR 68 | Temp 97.5°F | Ht 66.0 in | Wt 239.6 lb

## 2024-01-09 DIAGNOSIS — I10 Essential (primary) hypertension: Secondary | ICD-10-CM | POA: Diagnosis not present

## 2024-01-09 DIAGNOSIS — K219 Gastro-esophageal reflux disease without esophagitis: Secondary | ICD-10-CM

## 2024-01-09 DIAGNOSIS — R7303 Prediabetes: Secondary | ICD-10-CM | POA: Diagnosis not present

## 2024-01-09 DIAGNOSIS — J4520 Mild intermittent asthma, uncomplicated: Secondary | ICD-10-CM

## 2024-01-09 DIAGNOSIS — E785 Hyperlipidemia, unspecified: Secondary | ICD-10-CM | POA: Diagnosis not present

## 2024-01-09 LAB — RENAL FUNCTION PANEL
Albumin: 4.1 g/dL (ref 3.5–5.2)
BUN: 15 mg/dL (ref 6–23)
CO2: 29 meq/L (ref 19–32)
Calcium: 9.3 mg/dL (ref 8.4–10.5)
Chloride: 104 meq/L (ref 96–112)
Creatinine, Ser: 0.92 mg/dL (ref 0.40–1.20)
GFR: 66.06 mL/min (ref >60.00)
Glucose, Bld: 105 mg/dL — ABNORMAL HIGH (ref 70–99)
Phosphorus: 2.6 mg/dL (ref 2.3–4.6)
Potassium: 3.3 meq/L — ABNORMAL LOW (ref 3.5–5.1)
Sodium: 139 meq/L (ref 135–145)

## 2024-01-09 LAB — LIPID PANEL
Cholesterol: 170 mg/dL (ref 0–200)
HDL: 50.6 mg/dL (ref >39.00)
LDL Cholesterol: 105 mg/dL — ABNORMAL HIGH (ref 0–99)
NonHDL: 119.44
Total CHOL/HDL Ratio: 3
Triglycerides: 70 mg/dL (ref 0.0–149.0)
VLDL: 14 mg/dL (ref 0.0–40.0)

## 2024-01-09 LAB — HEMOGLOBIN A1C: Hgb A1c MFr Bld: 6 % (ref 4.6–6.5)

## 2024-01-09 MED ORDER — OMEPRAZOLE 20 MG PO CPDR: 20.0000 mg | DELAYED_RELEASE_CAPSULE | Freq: Every day | ORAL | 3 refills | Status: DC

## 2024-01-09 MED ORDER — MONTELUKAST SODIUM 10 MG PO TABS: ORAL_TABLET | ORAL | 3 refills | Status: DC

## 2024-01-09 MED ORDER — ATORVASTATIN CALCIUM 20 MG PO TABS: ORAL_TABLET | ORAL | 3 refills | Status: DC

## 2024-01-09 MED ORDER — LOSARTAN POTASSIUM 50 MG PO TABS: 50.0000 mg | ORAL_TABLET | Freq: Every day | ORAL | 3 refills | Status: DC

## 2024-01-09 MED ORDER — AMLODIPINE BESYLATE 5 MG PO TABS: 5.0000 mg | ORAL_TABLET | Freq: Every day | ORAL | 3 refills | Status: DC

## 2024-01-09 MED ORDER — ALBUTEROL SULFATE HFA 108 (90 BASE) MCG/ACT IN AERS: 2.0000 | INHALATION_SPRAY | Freq: Four times a day (QID) | RESPIRATORY_TRACT | 1 refills | Status: DC | PRN

## 2024-01-09 NOTE — Assessment & Plan Note (Signed)
No adverse effects with atorvastatin Repeat lipid panel today 

## 2024-01-09 NOTE — Progress Notes (Signed)
 Established Patient Visit  Patient: Claire Sutton   DOB: August 24, 1961   63 y.o. Female  MRN: [id number] Visit Date: 01/09/2024  Subjective:    Chief Complaint  Patient presents with   Follow-up    6   HPI Essential hypertension BP at goal with amlodipine and losartan BP Readings from Last 3 Encounters:  01/09/24 138/80  09/07/23 (!) 136/90  07/03/23 120/84    Maintain med dose Repeat BMP  Mild intermittent asthma without complication Controlled Albuterol rx expired, needs renewal. Continued montelukast  Gastroesophageal reflux disease without esophagitis Stable with omeprazole 20mg  prn  Dyslipidemia No adverse effects with atorvastatin Repeat lipid panel today  Prediabetes Fasting Home glucose: 80s-100s Postprandial glucose: 130 Repeat hgbA1c    Wt Readings from Last 3 Encounters:  01/09/24 239 lb 9.6 oz (108.7 kg)  09/07/23 237 lb (107.5 kg)  08/20/23 237 lb (107.5 kg)    Reviewed medical, surgical, and social history today  Medications: Outpatient Medications Prior to Visit  Medication Sig   cetirizine (ZYRTEC) 10 MG tablet Take by mouth.   Cholecalciferol (VITAMIN D PO) Take 1 tablet by mouth daily.    Cyanocobalamin (B-12) 1000 MCG CAPS Take by mouth.   Diclofenac Sodium (PENNSAID) 2 % SOLN Apply 1 inch topically 2 (two) times daily.   EPINEPHrine 0.3 mg/0.3 mL IJ SOAJ injection Inject 0.3 mg into the muscle as needed for anaphylaxis.   mometasone (NASONEX 24HR) 50 MCG/ACT nasal spray PLACE 2 SPRAYS INTO NOSE DAILY   Multiple Vitamins-Calcium (ONE-A-DAY WOMENS PO) Take 1 tablet by mouth daily.    prednisoLONE acetate (PRED FORTE) 1 % ophthalmic suspension Place 1 drop into the left eye 4 (four) times daily.   Zinc Sulfate (ZINC-220 PO) Take by mouth.   [DISCONTINUED] albuterol (VENTOLIN HFA) 108 (90 Base) MCG/ACT inhaler INHALE 1 TO 2 PUFFS INTO THE LUNGS EVERY 6 HOURS AS NEEDED FOR WHEEZING OR SHORTNESS OF BREATH   [DISCONTINUED]  amLODipine (NORVASC) 5 MG tablet Take 1 tablet (5 mg total) by mouth daily.   [DISCONTINUED] atorvastatin (LIPITOR) 20 MG tablet TAKE 1 TABLET(20 MG) BY MOUTH DAILY   [DISCONTINUED] losartan (COZAAR) 50 MG tablet Take 1 tablet (50 mg total) by mouth daily.   [DISCONTINUED] montelukast (SINGULAIR) 10 MG tablet TAKE 1 TABLET(10 MG) BY MOUTH AT BEDTIME   [DISCONTINUED] omeprazole (PRILOSEC) 20 MG capsule TAKE 1 CAPSULE(20 MG) BY MOUTH DAILY   No facility-administered medications prior to visit.   Reviewed past medical and social history.   ROS per HPI above      Objective:  BP 138/80 (BP Location: Left Arm, Patient Position: Sitting)   Pulse 68   Temp (!) 97.5 F (36.4 C) (Temporal)   Ht 5\' 6"  (1.676 m)   Wt 239 lb 9.6 oz (108.7 kg)   SpO2 97%   BMI 38.67 kg/m      Physical Exam Vitals and nursing note reviewed.  Cardiovascular:     Rate and Rhythm: Normal rate and regular rhythm.     Pulses: Normal pulses.     Heart sounds: Normal heart sounds.  Pulmonary:     Effort: Pulmonary effort is normal.     Breath sounds: Normal breath sounds.  Musculoskeletal:     Right lower leg: No edema.     Left lower leg: No edema.  Neurological:     Mental Status: She is alert and oriented to person, place, and  time.     No results found for any visits on 01/09/24.    Assessment & Plan:    Problem List Items Addressed This Visit     Dyslipidemia   No adverse effects with atorvastatin Repeat lipid panel today      Relevant Medications   atorvastatin (LIPITOR) 20 MG tablet   Other Relevant Orders   Lipid panel   Essential hypertension   BP at goal with amlodipine and losartan BP Readings from Last 3 Encounters:  01/09/24 138/80  09/07/23 (!) 136/90  07/03/23 120/84    Maintain med dose Repeat BMP      Relevant Medications   amLODipine (NORVASC) 5 MG tablet   atorvastatin (LIPITOR) 20 MG tablet   losartan (COZAAR) 50 MG tablet   Other Relevant Orders   Renal  Function Panel   Gastroesophageal reflux disease without esophagitis   Stable with omeprazole 20mg  prn      Relevant Medications   omeprazole (PRILOSEC) 20 MG capsule   Mild intermittent asthma without complication   Controlled Albuterol rx expired, needs renewal. Continued montelukast      Relevant Medications   montelukast (SINGULAIR) 10 MG tablet   albuterol (VENTOLIN HFA) 108 (90 Base) MCG/ACT inhaler   Prediabetes - Primary   Fasting Home glucose: 80s-100s Postprandial glucose: 130 Repeat hgbA1c      Relevant Orders   Hemoglobin A1c   Return in about 6 months (around 07/11/2024) for CPE (fasting).     Alysia Penna, NP

## 2024-01-09 NOTE — Assessment & Plan Note (Addendum)
 Fasting Home glucose: 80s-100s Postprandial glucose: 130 Repeat hgbA1c

## 2024-01-09 NOTE — Patient Instructions (Signed)
 Go to lab Maintain Heart healthy diet and daily exercise. Maintain current medications.

## 2024-01-09 NOTE — Assessment & Plan Note (Signed)
 Stable with omeprazole 20mg  prn

## 2024-01-09 NOTE — Assessment & Plan Note (Signed)
 BP at goal with amlodipine and losartan BP Readings from Last 3 Encounters:  01/09/24 138/80  09/07/23 (!) 136/90  07/03/23 120/84    Maintain med dose Repeat BMP

## 2024-01-09 NOTE — Assessment & Plan Note (Signed)
Controlled Albuterol rx expired, needs renewal. Continued montelukast

## 2024-01-10 ENCOUNTER — Encounter: Payer: Self-pay | Admitting: Nurse Practitioner

## 2024-02-26 ENCOUNTER — Other Ambulatory Visit: Payer: Self-pay | Admitting: Nurse Practitioner

## 2024-02-26 DIAGNOSIS — K219 Gastro-esophageal reflux disease without esophagitis: Secondary | ICD-10-CM

## 2024-02-28 ENCOUNTER — Other Ambulatory Visit: Payer: Self-pay | Admitting: Nurse Practitioner

## 2024-02-29 NOTE — Telephone Encounter (Signed)
 Medication: Mometasone  (Nasonex  24HR) 50 mcg/act  Directions: Place 2 sprays in each nostril daily Last given: 06/05/23 Number refills: 0 Last o/v: 01/09/24 Follow up: 6 months- 07/14/24 (CPE)  Labs:

## 2024-04-23 ENCOUNTER — Other Ambulatory Visit (HOSPITAL_COMMUNITY)
Admission: RE | Admit: 2024-04-23 | Discharge: 2024-04-23 | Disposition: A | Discharge status: Home / Self Care | Payer: PRIVATE HEALTH INSURANCE | Source: Ambulatory Visit | Attending: Obstetrics and Gynecology | Admitting: Obstetrics and Gynecology

## 2024-04-23 ENCOUNTER — Encounter: Payer: Self-pay | Admitting: Obstetrics and Gynecology

## 2024-04-23 ENCOUNTER — Ambulatory Visit (INDEPENDENT_AMBULATORY_CARE_PROVIDER_SITE_OTHER): Payer: PRIVATE HEALTH INSURANCE | Admitting: Obstetrics and Gynecology

## 2024-04-23 VITALS — BP 126/82 | Ht 65.0 in | Wt 241.8 lb

## 2024-04-23 DIAGNOSIS — E2839 Other primary ovarian failure: Secondary | ICD-10-CM

## 2024-04-23 DIAGNOSIS — Z01419 Encounter for gynecological examination (general) (routine) without abnormal findings: Secondary | ICD-10-CM

## 2024-04-23 DIAGNOSIS — Z1331 Encounter for screening for depression: Secondary | ICD-10-CM

## 2024-04-23 NOTE — Progress Notes (Signed)
 63 y.o. y.o. female here for annual exam. No LMP recorded. Patient is postmenopausal.    [id number] Single   RP:  Established patient presenting for annual gyn exam    HPI: Postmenopausal, well on no HRT. No PMB.  H/O endometrial ablation. Minimal hot flashes.  No pelvic pain.  Abstinent. Pap Neg 03/2022. H/o LGSIL.  Breasts normal.  Mammo Neg 11/2022. Urine/BMs normal.  BMI 41.12. Health labs with Fam MD.  Treated for hypercholesterolemia. Colonoscopy 09/2018.  BD normal in 03/2020 normal repeat ordered.  Body mass index is 40.24 kg/m.     04/23/2024    1:27 PM 01/09/2024    8:49 AM 07/03/2023    8:23 AM  Depression screen PHQ 2/9  Decreased Interest 0 0 0  Down, Depressed, Hopeless 0 0 0  PHQ - 2 Score 0 0 0  Altered sleeping  0 0  Tired, decreased energy  0 0  Change in appetite  0 0  Feeling bad or failure about yourself   0 0  Trouble concentrating  0 0  Moving slowly or fidgety/restless  0 0  Suicidal thoughts  0 0  PHQ-9 Score  0 0  Difficult doing work/chores  Not difficult at all Not difficult at all    Blood pressure 126/82, height 5' 5 (1.651 m), weight 241 lb 12.8 oz (109.7 kg).     Component Value Date/Time   DIAGPAP  03/08/2022 1156    - Negative for intraepithelial lesion or malignancy (NILM)   ADEQPAP  03/08/2022 1156    Satisfactory for evaluation; transformation zone component PRESENT.    GYN HISTORY:    Component Value Date/Time   DIAGPAP  03/08/2022 1156    - Negative for intraepithelial lesion or malignancy (NILM)   ADEQPAP  03/08/2022 1156    Satisfactory for evaluation; transformation zone component PRESENT.    OB History  Gravida Para Term Preterm AB Living  3 2   1 2   SAB IAB Ectopic Multiple Live Births   1       # Outcome Date GA Lbr Len/2nd Weight Sex Type Anes PTL Lv  3 IAB           2 Para           1 Para             Past Medical History:  Diagnosis Date   Allergy     ASCUS (atypical squamous cells of undetermined  significance) on Pap smear 07/2006   NEG HIGH RISK HPV-- THEN IN 10/2006 AND 07/2007 PAPS WITH BENIGN REACTIVE CHANGES   Asthma    Cataract 2024   bilateral   GERD (gastroesophageal reflux disease)    Headache    History of blood clot in brain    a. Fall age 24 - was told she had a blood clot in the brain from this.   Hyperlipidemia    Hypertension    Iritis of both eyes    LGSIL (low grade squamous intraepithelial dysplasia) 08/2004   PAP ON 02/2005, 7,8,9,10,11,12WNL   Obesity    Seizures (HCC)    a. began after she had a fall at age 24, last seizure age 83.    Past Surgical History:  Procedure Laterality Date   CATARACT EXTRACTION, BILATERAL Bilateral 11/23/2022   COLPOSCOPY     ENDOMETRIAL ABLATION  02/2007   NOVASURE/MARSUPILIZATION OF BARTHOLIN   EYE SURGERY  11/09/2022 and 11/23/2022   Cataract surgery   MARSUPILIZATION OF BARTHOLIN  02/2007   AT TIME OF NOVASURE ABLATION SURGERY   MOUTH SURGERY     RETINAL LASER PROCEDURE Left 12/12/2023   TUBAL LIGATION      Current Outpatient Medications on File Prior to Visit  Medication Sig Dispense Refill   albuterol  (VENTOLIN  HFA) 108 (90 Base) MCG/ACT inhaler Inhale 2 puffs into the lungs every 6 (six) hours as needed for wheezing or shortness of breath. 6.7 g 1   amLODipine  (NORVASC ) 5 MG tablet Take 1 tablet (5 mg total) by mouth daily. 90 tablet 3   atorvastatin  (LIPITOR) 20 MG tablet TAKE 1 TABLET(20 MG) BY MOUTH DAILY 90 tablet 3   cetirizine (ZYRTEC) 10 MG tablet Take by mouth.     Cholecalciferol (VITAMIN D  PO) Take 1 tablet by mouth daily.      Cyanocobalamin (B-12) 1000 MCG CAPS Take by mouth.     Diclofenac  Sodium (PENNSAID ) 2 % SOLN Apply 1 inch topically 2 (two) times daily. 112 g 1   EPINEPHrine  0.3 mg/0.3 mL IJ SOAJ injection Inject 0.3 mg into the muscle as needed for anaphylaxis. 1 each 1   losartan  (COZAAR ) 50 MG tablet Take 1 tablet (50 mg total) by mouth daily. 90 tablet 3   mometasone  (NASONEX ) 50 MCG/ACT  nasal spray SHAKE LIQUID AND USE 2 SPRAYS IN EACH NOSTRIL DAILY 17 g 1   montelukast  (SINGULAIR ) 10 MG tablet TAKE 1 TABLET(10 MG) BY MOUTH AT BEDTIME 90 tablet 3   Multiple Vitamins-Calcium  (ONE-A-DAY WOMENS PO) Take 1 tablet by mouth daily.      omeprazole  (PRILOSEC) 20 MG capsule Take 1 capsule (20 mg total) by mouth daily. 90 capsule 3   prednisoLONE acetate (PRED FORTE) 1 % ophthalmic suspension Place 1 drop into the left eye 4 (four) times daily.     Zinc Sulfate (ZINC-220 PO) Take by mouth. (Patient not taking: Reported on 04/23/2024)     No current facility-administered medications on file prior to visit.    Social History   Socioeconomic History   Marital status: Single    Spouse name: Not on file   Number of children: Not on file   Years of education: Not on file   Highest education level: Some college, no degree  Occupational History   Not on file  Tobacco Use   Smoking status: Never    Passive exposure: Past   Smokeless tobacco: Never  Vaping Use   Vaping status: Never Used  Substance and Sexual Activity   Alcohol use: No   Drug use: No   Sexual activity: Not Currently    Partners: Male    Birth control/protection: Post-menopausal, Surgical    Comment: 1st intercourse 63 yo-More than 5 partners, btl  Other Topics Concern   Not on file  Social History Narrative   Not on file   Social Drivers of Health   Financial Resource Strain: Low Risk  (01/05/2024)   Overall Financial Resource Strain (CARDIA)    Difficulty of Paying Living Expenses: Not hard at all  Food Insecurity: No Food Insecurity (01/05/2024)   Hunger Vital Sign    Worried About Running Out of Food in the Last Year: Never true    Ran Out of Food in the Last Year: Never true  Transportation Needs: No Transportation Needs (01/05/2024)   PRAPARE - Administrator, Civil Service (Medical): No    Lack of Transportation (Non-Medical): No  Physical Activity: Sufficiently Active (01/05/2024)   Exercise  Vital Sign    Days  of Exercise per Week: 4 days    Minutes of Exercise per Session: 40 min  Stress: No Stress Concern Present (01/05/2024)   [employer] of Occupational Health - Occupational Stress Questionnaire    Feeling of Stress : Only a little  Social Connections: Moderately Integrated (01/05/2024)   Social Connection and Isolation Panel    Frequency of Communication with Friends and Family: More than three times a week    Frequency of Social Gatherings with Friends and Family: Three times a week    Attends Religious Services: More than 4 times per year    Active Member of Clubs or Organizations: Yes    Attends Banker Meetings: More than 4 times per year    Marital Status: Never married  Intimate Partner Violence: Unknown (02/07/2022)   Received from [facility]   HITS    Physically Hurt: Not on file    Insult or Talk Down To: Not on file    Threaten Physical Harm: Not on file    Scream or Curse: Not on file    Family History  Problem Relation Age of Onset   Hypertension Mother    Hyperlipidemia Mother    Heart disease Mother        was told she may have had a few MIs that she was unaware of   Diabetes Father    Arthritis Sister        lumbar DDD with spinal stenosis   Hypertension Sister    Diabetes Paternal Grandfather    Colon cancer Neg Hx    Colon polyps Neg Hx    Esophageal cancer Neg Hx    Stomach cancer Neg Hx    Rectal cancer Neg Hx    Breast cancer Neg Hx      Allergies  Allergen Reactions   Codeine     COLD SWEAT, LIGHT-HEADEDNESS. Cold sweats   Cyclobenzaprine  Rash      Patient's last menstrual period was No LMP recorded. Patient is postmenopausal..            Review of Systems Alls systems reviewed and are negative.     Physical Exam Constitutional:      Appearance: Normal appearance.  Genitourinary:     Vulva and urethral meatus normal.     No lesions in the vagina.     Right Labia: No rash, lesions or skin  changes.    Left Labia: No lesions, skin changes or rash.    No vaginal discharge or tenderness.     No vaginal prolapse present.    No vaginal atrophy present.     Right Adnexa: not tender, not palpable and no mass present.    Left Adnexa: not tender, not palpable and no mass present.    No cervical motion tenderness or discharge.     Uterus is not enlarged, tender or irregular.  Breasts:    Right: Normal.     Left: Normal.  HENT:     Head: Normocephalic.  Neck:     Thyroid : No thyroid  mass, thyromegaly or thyroid  tenderness.   Cardiovascular:     Rate and Rhythm: Normal rate and regular rhythm.     Heart sounds: Normal heart sounds, S1 normal and S2 normal.  Pulmonary:     Effort: Pulmonary effort is normal.     Breath sounds: Normal breath sounds and air entry.  Abdominal:     General: There is no distension.     Palpations: Abdomen is soft. There  is no mass.     Tenderness: There is no abdominal tenderness. There is no guarding or rebound.   Musculoskeletal:        General: Normal range of motion.     Cervical back: Full passive range of motion without pain, normal range of motion and neck supple. No tenderness.     Right lower leg: No edema.     Left lower leg: No edema.   Neurological:     Mental Status: She is alert.   Skin:    General: Skin is warm.   Psychiatric:        Mood and Affect: Mood normal.        Behavior: Behavior normal.        Thought Content: Thought content normal.  Vitals and nursing note reviewed. Exam conducted with a chaperone present.       A:         Well Woman GYN exam                             P:        Pap smear collected today Encouraged annual mammogram screening Colon cancer screening up-to-date DXA ordered today Labs and immunizations to do with PMD Discussed breast self exams Encouraged healthy lifestyle practices Encouraged Vit D and Calcium    No follow-ups on file.  Reinaldo Caras

## 2024-04-25 ENCOUNTER — Ambulatory Visit: Payer: Self-pay | Admitting: Obstetrics and Gynecology

## 2024-04-25 LAB — CYTOLOGY - PAP: Diagnosis: NEGATIVE

## 2024-04-25 MED ORDER — FLUCONAZOLE 150 MG PO TABS: 150.0000 mg | ORAL_TABLET | Freq: Once | ORAL | 0 refills | Status: AC

## 2024-04-28 MED ORDER — FLUCONAZOLE 150 MG PO TABS: 150.0000 mg | ORAL_TABLET | Freq: Once | ORAL | 0 refills | Status: AC

## 2024-05-14 ENCOUNTER — Emergency Department (HOSPITAL_BASED_OUTPATIENT_CLINIC_OR_DEPARTMENT_OTHER): Payer: PRIVATE HEALTH INSURANCE

## 2024-05-14 ENCOUNTER — Other Ambulatory Visit: Payer: Self-pay

## 2024-05-14 ENCOUNTER — Emergency Department (HOSPITAL_BASED_OUTPATIENT_CLINIC_OR_DEPARTMENT_OTHER)
Admission: EM | Admit: 2024-05-14 | Discharge: 2024-05-15 | Disposition: A | Discharge status: Home / Self Care | Payer: PRIVATE HEALTH INSURANCE | Attending: Emergency Medicine | Admitting: Emergency Medicine

## 2024-05-14 ENCOUNTER — Emergency Department (HOSPITAL_COMMUNITY): Payer: PRIVATE HEALTH INSURANCE

## 2024-05-14 ENCOUNTER — Encounter (HOSPITAL_BASED_OUTPATIENT_CLINIC_OR_DEPARTMENT_OTHER): Payer: Self-pay | Admitting: Emergency Medicine

## 2024-05-14 DIAGNOSIS — I639 Cerebral infarction, unspecified: Secondary | ICD-10-CM

## 2024-05-14 DIAGNOSIS — I10 Essential (primary) hypertension: Secondary | ICD-10-CM | POA: Insufficient documentation

## 2024-05-14 DIAGNOSIS — R262 Difficulty in walking, not elsewhere classified: Secondary | ICD-10-CM | POA: Diagnosis present

## 2024-05-14 DIAGNOSIS — R531 Weakness: Secondary | ICD-10-CM | POA: Diagnosis not present

## 2024-05-14 DIAGNOSIS — Z79899 Other long term (current) drug therapy: Secondary | ICD-10-CM | POA: Diagnosis not present

## 2024-05-14 DIAGNOSIS — R200 Anesthesia of skin: Secondary | ICD-10-CM | POA: Insufficient documentation

## 2024-05-14 LAB — CBC
HCT: 43.4 % (ref 36.0–46.0)
HCT: 42.9 % (ref 36.0–46.0)
Hemoglobin: 14 g/dL (ref 12.0–15.0)
Hemoglobin: 13.9 g/dL (ref 12.0–15.0)
MCH: 27.8 pg (ref 26.0–34.0)
MCH: 28.1 pg (ref 26.0–34.0)
MCHC: 32.3 g/dL (ref 30.0–36.0)
MCHC: 32.4 g/dL (ref 30.0–36.0)
MCV: 86.3 fL (ref 80.0–100.0)
MCV: 86.7 fL (ref 80.0–100.0)
Platelets: 228 K/uL (ref 150–400)
Platelets: 234 K/uL (ref 150–400)
RBC: 5.03 MIL/uL (ref 3.87–5.11)
RBC: 4.95 MIL/uL (ref 3.87–5.11)
RDW: 14.3 % (ref 11.5–15.5)
RDW: 14.3 % (ref 11.5–15.5)
WBC: 4.1 K/uL (ref 4.0–10.5)
WBC: 4 K/uL (ref 4.0–10.5)
nRBC: 0 % (ref 0.0–0.2)
nRBC: 0 % (ref 0.0–0.2)

## 2024-05-14 LAB — URINALYSIS, ROUTINE W REFLEX MICROSCOPIC
Bilirubin Urine: NEGATIVE
Glucose, UA: NEGATIVE mg/dL
Hgb urine dipstick: NEGATIVE
Ketones, ur: NEGATIVE mg/dL
Leukocytes,Ua: NEGATIVE
Nitrite: NEGATIVE
Protein, ur: NEGATIVE mg/dL
Specific Gravity, Urine: 1.02 (ref 1.005–1.030)
pH: 6.5 (ref 5.0–8.0)

## 2024-05-14 LAB — COMPREHENSIVE METABOLIC PANEL WITH GFR
ALT: 25 U/L (ref 0–44)
AST: 23 U/L (ref 15–41)
Albumin: 4.2 g/dL (ref 3.5–5.0)
Alkaline Phosphatase: 105 U/L (ref 38–126)
Anion gap: 10 (ref 5–15)
BUN: 12 mg/dL (ref 8–23)
CO2: 24 mmol/L (ref 22–32)
Calcium: 9.7 mg/dL (ref 8.9–10.3)
Chloride: 107 mmol/L (ref 98–111)
Creatinine, Ser: 1 mg/dL (ref 0.44–1.00)
GFR, Estimated: >60 mL/min (ref >60)
Glucose, Bld: 86 mg/dL (ref 70–99)
Potassium: 4 mmol/L (ref 3.5–5.1)
Sodium: 142 mmol/L (ref 135–145)
Total Bilirubin: 0.5 mg/dL (ref 0.0–1.2)
Total Protein: 7.8 g/dL (ref 6.5–8.1)

## 2024-05-14 LAB — ETHANOL: Alcohol, Ethyl (B): <15 mg/dL (ref <15)

## 2024-05-14 LAB — PROTIME-INR
INR: 1 (ref 0.8–1.2)
Prothrombin Time: 13.6 s (ref 11.4–15.2)

## 2024-05-14 LAB — APTT: aPTT: 33 s (ref 24–36)

## 2024-05-14 LAB — URINE DRUG SCREEN
Amphetamines: NOT DETECTED
Barbiturates: NOT DETECTED
Benzodiazepines: NOT DETECTED
Cocaine: NOT DETECTED
Fentanyl: NOT DETECTED
Methadone Scn, Ur: NOT DETECTED
Opiates: NOT DETECTED
Tetrahydrocannabinol: NOT DETECTED

## 2024-05-14 LAB — CBG MONITORING, ED
Glucose-Capillary: 74 mg/dL (ref 70–99)
Glucose-Capillary: 89 mg/dL (ref 70–99)

## 2024-05-14 LAB — DIFFERENTIAL
Abs Immature Granulocytes: 0 K/uL (ref 0.00–0.07)
Basophils Absolute: 0 K/uL (ref 0.0–0.1)
Basophils Relative: 1 %
Eosinophils Absolute: 0.1 K/uL (ref 0.0–0.5)
Eosinophils Relative: 3 %
Immature Granulocytes: 0 %
Lymphocytes Relative: 37 %
Lymphs Abs: 1.5 K/uL (ref 0.7–4.0)
Monocytes Absolute: 0.4 K/uL (ref 0.1–1.0)
Monocytes Relative: 10 %
Neutro Abs: 2 K/uL (ref 1.7–7.7)
Neutrophils Relative %: 49 %

## 2024-05-14 LAB — CK: Total CK: 61 U/L (ref 38–234)

## 2024-05-14 LAB — MAGNESIUM: Magnesium: 1.7 mg/dL (ref 1.7–2.4)

## 2024-05-14 NOTE — ED Provider Notes (Addendum)
 [place] EMERGENCY DEPARTMENT AT [facility] Provider Note   CSN: [id number] Arrival date & time: 05/14/24  1254     Patient presents with: Weakness   Claire Sutton is a 63 y.o. female.   Patient with a complaint of some difficulty walking feeling off balance and feeling like there was weakness to the left side greater than right.  Onset of symptoms were at 1930.  Patient did have some loose bowel movements during the night.  The son thought maybe her speech was slow when he went out to pick her up to bring her in here.  No visual changes.  Patient has never had anything like this.  She did not have any room spinning or dizziness did not feel lightheaded.  During the night she had several loose bowel movements without any blood.  Did have difficulty flushing the toilet with her left arm.  Past medical history is significant for prediabetic and hypertension.  Also there was no headache.  Patient also has hyperlipidemia.  Patient feels as if the strength is better but it still not exactly normal.  Says that left side still feels somewhat heavy she has during the night both sides felt heavy but left side was worse.  Was a little bit of numbness in the upper part of the left arm during the night.       Prior to Admission medications   Medication Sig Start Date End Date Taking? Authorizing Provider  albuterol  (VENTOLIN  HFA) 108 (90 Base) MCG/ACT inhaler Inhale 2 puffs into the lungs every 6 (six) hours as needed for wheezing or shortness of breath. 01/09/24   Nche, Roselie Rockford, NP  amLODipine  (NORVASC ) 5 MG tablet Take 1 tablet (5 mg total) by mouth daily. 01/09/24   Nche, Roselie Rockford, NP  atorvastatin  (LIPITOR) 20 MG tablet TAKE 1 TABLET(20 MG) BY MOUTH DAILY 01/09/24   Nche, Roselie Rockford, NP  cetirizine (ZYRTEC) 10 MG tablet Take by mouth. 12/03/15   [provider]  Cholecalciferol (VITAMIN D  PO) Take 1 tablet by mouth daily.     [provider]  Cyanocobalamin  (B-12) 1000 MCG CAPS Take by mouth.    [provider]  Diclofenac  Sodium (PENNSAID ) 2 % SOLN Apply 1 inch topically 2 (two) times daily. 10/21/20   Webb, Padonda B, FNP  EPINEPHrine  0.3 mg/0.3 mL IJ SOAJ injection Inject 0.3 mg into the muscle as needed for anaphylaxis. 05/27/21   Nche, Roselie Rockford, NP  losartan  (COZAAR ) 50 MG tablet Take 1 tablet (50 mg total) by mouth daily. 01/09/24   Nche, Roselie Rockford, NP  mometasone  (NASONEX ) 50 MCG/ACT nasal spray SHAKE LIQUID AND USE 2 SPRAYS IN EACH NOSTRIL DAILY 02/29/24   Nche, Roselie Rockford, NP  montelukast  (SINGULAIR ) 10 MG tablet TAKE 1 TABLET(10 MG) BY MOUTH AT BEDTIME 01/09/24   Nche, Roselie Rockford, NP  Multiple Vitamins-Calcium  (ONE-A-DAY WOMENS PO) Take 1 tablet by mouth daily.     [provider]  omeprazole  (PRILOSEC) 20 MG capsule Take 1 capsule (20 mg total) by mouth daily. 01/09/24   Nche, Roselie Rockford, NP  prednisoLONE acetate (PRED FORTE) 1 % ophthalmic suspension Place 1 drop into the left eye 4 (four) times daily. 12/04/23   [provider]  Zinc Sulfate (ZINC-220 PO) Take by mouth. Patient not taking: Reported on 04/23/2024    [provider]    Allergies: Codeine and Cyclobenzaprine     Review of Systems  Constitutional:  Negative for chills and fever.  HENT:  Negative for ear pain and sore throat.   Eyes:  Negative for pain and visual disturbance.  Respiratory:  Negative for cough and shortness of breath.   Cardiovascular:  Negative for chest pain and palpitations.  Gastrointestinal:  Negative for abdominal pain and vomiting.  Genitourinary:  Negative for dysuria and hematuria.  Musculoskeletal:  Negative for arthralgias and back pain.  Skin:  Negative for color change and rash.  Neurological:  Positive for speech difficulty, weakness and numbness. Negative for dizziness, tremors, seizures, syncope, light-headedness and headaches.  All other systems reviewed and are negative.   Updated Vital  Signs BP (!) 163/82   Pulse 62   Temp 98.7 F (37.1 C)   Resp 16   Ht 1.651 m (5' 5)   Wt 110.1 kg   SpO2 98%   BMI 40.40 kg/m   Physical Exam  (all labs ordered are listed, but only abnormal results are displayed) Labs Reviewed  COMPREHENSIVE METABOLIC PANEL WITH GFR  CBC  URINALYSIS, ROUTINE W REFLEX MICROSCOPIC  ETHANOL  PROTIME-INR  APTT  DIFFERENTIAL  URINE DRUG SCREEN  CBG MONITORING, ED    EKG: EKG Interpretation Date/Time:  Wednesday May 14 2024 13:18:17 EDT Ventricular Rate:  64 PR Interval:  152 QRS Duration:  89 QT Interval:  415 QTC Calculation: 429 R Axis:   -40  Text Interpretation: Sinus rhythm Probable left atrial enlargement Left axis deviation Low voltage, precordial leads Anteroseptal infarct, old Borderline T abnormalities, inferior leads No previous ECGs available Confirmed by Smt [facility] [phone]) on 05/14/2024 1:35:55 PM  Radiology: No results found.   Procedures   Medications Ordered in the ED - No data to display                                  Medical Decision Making Amount and/or Complexity of Data Reviewed Labs: ordered. Radiology: ordered.   Patient symptoms not totally inconsistent with CVA that may have occurred with onset at 1930 last night she is out of the stroke window.  On exam strength seems pretty normal but patient still feels as if left arm and left leg left leg greater than left arm is a little bit fatigued to her feels a little bit weak.  Son noted that her speech seemed to be slow this morning.  Blood blood cell count 4.1 hemoglobin 14.0 platelets 228 blood sugar was 74 urinalysis negative.  Other labs still pending will get head CT.  Not consistent with an acute cardiac event.  But not completely normal.  No previous EKG for comparison.  CRITICAL CARE Performed by: Zaccheaus Storlie Total critical care time: 45 minutes Critical care time was exclusive of separately billable procedures and treating other  patients. Critical care was necessary to treat or prevent imminent or life-threatening deterioration. Critical care was time spent personally by me on the following activities: development of treatment plan with patient and/or surrogate as well as nursing, discussions with consultants, evaluation of patient's response to treatment, examination of patient, obtaining history from patient or surrogate, ordering and performing treatments and interventions, ordering and review of laboratory studies, ordering and review of radiographic studies, pulse oximetry and re-evaluation of patient's condition.   Alcohol less than 15 INR is 1 urine drug screen negative.  CBC normal.  Complete metabolic panel normal including LFTs.  Urinalysis negative.  CT head without any acute findings.  Will reassess patient will stand her up  and see which she feels like standing.  Do not think that this is lightheadedness or orthostasis.  But will order orthostatic pressures.  Then will discuss with neurology about consideration for MRI.  Patient's orthostatic blood pressures without any abnormalities.  Patient when walking although feels better than she did earlier in the day still feels not quite right.  Discussed with neurologist on-call Dr. Voncile, he agrees that the patient needs MRI.  Will send her to [facility] for MRI.  Patient accepted ED to ED by Dr. Lenor.  Patient will go to [facility] for MRI.  Final diagnoses:  Cerebrovascular accident (CVA), unspecified mechanism [facility])    ED Discharge Orders     None          Geraldene Hamilton, MD 05/14/24 1449    Geraldene Hamilton, MD 05/14/24 1647    Geraldene Hamilton, MD 05/14/24 1659    Geraldene Hamilton, MD 05/14/24 1713    Geraldene Hamilton, MD 05/14/24 1729

## 2024-05-14 NOTE — ED Notes (Signed)
 Called lab to add coag, diff, and UDS.

## 2024-05-14 NOTE — ED Triage Notes (Signed)
 Pt via pov from home with weakness that began at dinner last night. She states that she got up to leave the restaurant and felt unstable, but not dizzy. She states it felt like my limbs didn't work. Also felt that she was on the verge of passing out. Pt denies any one sided weakness, difficulty speaking or thinking, vision changes. She states her arm didn't have the strength to flush the toilet. Pt alert & oriented, nad noted.

## 2024-05-14 NOTE — Plan of Care (Signed)
 On call note  Weakness in left leg>arm since dinner last night. Walking she is slow. Speech was slow in the AM. Orthostatics nomal. MRI (ED to ED) Call [facility] neurologist if positive or if any reason for formal consult.  D/W Dr. Zackowski  Erisha Paugh, MD Neurologist on call

## 2024-05-14 NOTE — ED Notes (Signed)
 Patient transported to MRI

## 2024-05-14 NOTE — ED Notes (Signed)
 Tequila with cl called for transport to [facility]

## 2024-05-14 NOTE — ED Notes (Signed)
 Able to ambulate to restroom without assistance.

## 2024-05-14 NOTE — ED Provider Notes (Signed)
 Patient arrived in transfer from [facility] for concern of CVA.  Had onset of left-sided weakness and difficulty walking last night.  Case discussed with neurology recommends MRI and formal consult as needed. Physical Exam  BP (!) 171/91   Pulse 61   Temp 98.9 F (37.2 C) (Oral)   Resp 18   Ht 5' 5 (1.651 m)   Wt 110.1 kg   SpO2 99%   BMI 40.40 kg/m   Physical Exam Vitals and nursing note reviewed.  Constitutional:      General: She is not in acute distress.    Appearance: Normal appearance. She is well-developed. She is not ill-appearing, toxic-appearing or diaphoretic.  HENT:     Head: Normocephalic and atraumatic.     Right Ear: External ear normal.     Left Ear: External ear normal.     Nose: Nose normal.     Mouth/Throat:     Mouth: Mucous membranes are moist.  Eyes:     Extraocular Movements: Extraocular movements intact.     Conjunctiva/sclera: Conjunctivae normal.  Cardiovascular:     Rate and Rhythm: Normal rate and regular rhythm.  Pulmonary:     Effort: Pulmonary effort is normal. No respiratory distress.  Abdominal:     General: There is no distension.     Palpations: Abdomen is soft.     Tenderness: There is no abdominal tenderness.  Musculoskeletal:        General: No swelling. Normal range of motion.     Cervical back: Normal range of motion and neck supple.  Skin:    General: Skin is warm and dry.  Neurological:     General: No focal deficit present.     Mental Status: She is alert and oriented to person, place, and time.     Cranial Nerves: No cranial nerve deficit.     Sensory: No sensory deficit.     Motor: No weakness.     Coordination: Coordination normal.  Psychiatric:        Mood and Affect: Mood normal.        Behavior: Behavior normal.     Procedures  Procedures  ED Course / MDM    Medical Decision Making Amount and/or Complexity of Data Reviewed Labs: ordered. Radiology: ordered.   On assessment, patient is  well-appearing.  She describes a sensation of muscle fatigue that began last night.  This fatigue caused her difficulty with walking and even flushing the toilet.  Today, symptoms have been improved but are still present.  She denies any other associated symptoms.  She has no focal deficits on exam.  MRI did not show any acute findings.  She is on a statin but CK was normal.  She denies any new medications or supplements.  Given her negative workup and improving symptoms, patient is stable for discharge.       Melvenia Motto, MD 05/14/24 [phone]

## 2024-05-14 NOTE — ED Triage Notes (Signed)
 BIB [facility] from [facility] for left sided weakness. Transferred here MRI.

## 2024-05-14 NOTE — Discharge Instructions (Addendum)
 Your test results today were all normal.  You may have acquired a virus that has caused muscle fatigue.  I think it is reassuring that your symptoms are improving.  Drink plenty of fluids to stay hydrated.  Follow-up with your PCP.  Return to the emergency department for any new or worsening symptoms of concern.

## 2024-05-14 NOTE — ED Notes (Addendum)
 Report given to [place], Consulting civil engineer at [facility].

## 2024-05-15 ENCOUNTER — Ambulatory Visit: Payer: Self-pay

## 2024-05-15 NOTE — ED Notes (Signed)
 PT D/C'D AFTER INSTRUCTIONS REVIEWED. PT VERBALIZED UNDERSTANDING. NAD REPORTED OR NOTED AT THIS TIME.

## 2024-05-15 NOTE — Telephone Encounter (Signed)
 FYI Only or Action Required?: Action required by provider: clinical question for provider.  Patient was last seen in primary care on 01/09/2024 by Nche, Roselie Rockford, NP.  Called Nurse Triage reporting neurological system.  Symptoms began 2 days ago .  Interventions attempted: Rest, hydration, or home remedies.  Symptoms are: gradually worsening.  Triage Disposition: See HCP Within 4 Hours (Or PCP Triage)  Patient/caregiver understands and will follow disposition?:   Called [facility] and spoke to [place] about disposition in the setting of severe weakness Left >right. Since seen in ED asked if do hospital f/u or different disposition (ED).  Jamee is checking with provider regarding disposition. Was advised she will call pt back. CT and MRI read as negative. Pt having no difficulty with facial drooping, no problems of seeing, hearing or speech. Weakness is main complaint             Copied from CRM [id number]. Topic: Clinical - Red Word Triage >> May 15, 2024  9:16 AM Mesmerise C wrote: [facility] that prompted transfer to Nurse Triage: Patient's son stated patient is really fatigued, feeling like she can fall any moment, left side of body not functioning propery Reason for Disposition  [1] MODERATE weakness (e.g., interferes with work, school, normal activities) AND [2] cause unknown  (Exceptions: Weakness from acute minor illness or poor fluid intake; weakness is chronic and not worse.)  Answer Assessment - Initial Assessment Questions 1. DESCRIPTION: Describe how you are feeling.     Weakness, left side of body  2. SEVERITY: How bad is it?  Can you stand and walk?     Sitting is tired- taking one step  3. ONSET: When did these symptoms begin? (e.g., hours, days, weeks, months)     2 days ago  4. CAUSE: What do you think is causing the weakness or fatigue? (e.g., not drinking enough fluids, medical problem, trouble sleeping)     Poor appetite 5. NEW MEDICINES:  Have you  started on any new medicines recently? (e.g., opioid pain medicines, benzodiazepines, muscle relaxants, antidepressants, antihistamines, neuroleptics, beta blockers)     no 6. OTHER SYMPTOMS: Do you have any other symptoms? (e.g., chest pain, fever, cough, SOB, vomiting, diarrhea, bleeding, other areas of pain)     Tingling arms feel weak,  fele drugged  Protocols used: Weakness (Generalized) and Fatigue-A-AH

## 2024-05-16 ENCOUNTER — Ambulatory Visit (INDEPENDENT_AMBULATORY_CARE_PROVIDER_SITE_OTHER): Payer: PRIVATE HEALTH INSURANCE | Admitting: Nurse Practitioner

## 2024-05-16 ENCOUNTER — Ambulatory Visit (INDEPENDENT_AMBULATORY_CARE_PROVIDER_SITE_OTHER)
Admission: RE | Admit: 2024-05-16 | Discharge: 2024-05-16 | Disposition: A | Discharge status: Home / Self Care | Payer: PRIVATE HEALTH INSURANCE | Source: Ambulatory Visit | Attending: Nurse Practitioner

## 2024-05-16 ENCOUNTER — Encounter: Payer: Self-pay | Admitting: Nurse Practitioner

## 2024-05-16 VITALS — BP 130/80 | HR 63 | Temp 98.1°F | Ht 65.0 in | Wt 235.6 lb

## 2024-05-16 DIAGNOSIS — R5383 Other fatigue: Secondary | ICD-10-CM

## 2024-05-16 DIAGNOSIS — J020 Streptococcal pharyngitis: Secondary | ICD-10-CM

## 2024-05-16 DIAGNOSIS — E559 Vitamin D deficiency, unspecified: Secondary | ICD-10-CM | POA: Diagnosis not present

## 2024-05-16 LAB — POCT RAPID STREP A (OFFICE): Rapid Strep A Screen: POSITIVE — AB

## 2024-05-16 LAB — TSH: TSH: 1.36 u[IU]/mL (ref 0.35–5.50)

## 2024-05-16 LAB — POCT MONO (EPSTEIN BARR VIRUS): Mono, POC: NEGATIVE

## 2024-05-16 LAB — VITAMIN D 25 HYDROXY (VIT D DEFICIENCY, FRACTURES): VITD: 44.4 ng/mL (ref 30.00–100.00)

## 2024-05-16 LAB — POC COVID19 BINAXNOW: SARS Coronavirus 2 Ag: NEGATIVE

## 2024-05-16 MED ORDER — AMOXICILLIN 875 MG PO TABS: 875.0000 mg | ORAL_TABLET | Freq: Two times a day (BID) | ORAL | 0 refills | Status: DC

## 2024-05-16 NOTE — Progress Notes (Signed)
 Established Patient Visit  Patient: Claire Sutton   DOB: July 20, 1961   63 y.o. Female  MRN: [id number] Visit Date: 05/16/2024  Subjective:    Chief Complaint  Patient presents with   Hospitalization Follow-up   Fatigue   Nerve Pain    Sciatica    Hip Pain    Sore to touch    Accompanied by Son  Ms Bacchi presents with left side weakness and fatigue x 3days. Describes as extreme muscle fatigue with minimal exertion.  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 Normal sleep 5-6hrs each night, wakes up once to use bedroom but able to return to sleep. Today she also reports acute on chronic left hip pain with left radiculopathy. Gradually worsening in last 3weeks. Associated with stiffness after prolonged sitting. She was evaluated in the ED yesterday to r/o CVA: no acute finding per CT head, MRI head, ECG, CBC, CMP, magnesium, CK, UDS, ALCOHOL, UA and PTT/PT  Reviewed medical, surgical, and social history today  Medications: Outpatient Medications Prior to Visit  Medication Sig   albuterol  (VENTOLIN  HFA) 108 (90 Base) MCG/ACT inhaler Inhale 2 puffs into the lungs every 6 (six) hours as needed for wheezing or shortness of breath.   amLODipine  (NORVASC ) 5 MG tablet Take 1 tablet (5 mg total) by mouth daily.   atorvastatin  (LIPITOR) 20 MG tablet TAKE 1 TABLET(20 MG) BY MOUTH DAILY   cetirizine (ZYRTEC) 10 MG tablet Take by mouth.   Cholecalciferol  (VITAMIN D  PO) Take 1 tablet by mouth daily.    Cyanocobalamin (B-12) 1000 MCG CAPS Take by mouth.   EPINEPHrine  0.3 mg/0.3 mL IJ SOAJ injection Inject 0.3 mg into the muscle as needed for anaphylaxis.   losartan  (COZAAR ) 50 MG tablet Take 1 tablet (50 mg total) by mouth daily.   mometasone  (NASONEX ) 50 MCG/ACT nasal spray SHAKE LIQUID AND USE 2 SPRAYS IN EACH NOSTRIL DAILY   montelukast  (SINGULAIR ) 10 MG tablet TAKE 1 TABLET(10 MG) BY MOUTH AT BEDTIME   Multiple  Vitamins-Calcium  (ONE-A-DAY WOMENS PO) Take 1 tablet by mouth daily.    omeprazole  (PRILOSEC) 20 MG capsule Take 1 capsule (20 mg total) by mouth daily.   prednisoLONE acetate (PRED FORTE) 1 % ophthalmic suspension Place 1 drop into the left eye 4 (four) times daily.   Diclofenac  Sodium (PENNSAID ) 2 % SOLN Apply 1 inch topically 2 (two) times daily. (Patient not taking: Reported on 05/16/2024)   Zinc Sulfate (ZINC-220 PO) Take by mouth. (Patient not taking: Reported on 05/16/2024)   No facility-administered medications prior to visit.   Reviewed past medical and social history.   ROS per HPI above Review of Systems  Constitutional:  Positive for malaise/fatigue. Negative for chills and fever.  HENT: Negative.    Respiratory: Negative.    Cardiovascular: Negative.   Gastrointestinal: Negative.   Genitourinary: Negative.   Skin: Negative.   Neurological:  Positive for weakness. Negative for sensory change, speech change and headaches.  Psychiatric/Behavioral: Negative.      Last CBC Lab Results  Component Value Date   WBC 4.0 05/14/2024   HGB 13.9 05/14/2024   HCT 42.9 05/14/2024   MCV 86.7 05/14/2024   MCH 28.1 05/14/2024   RDW 14.3 05/14/2024   PLT 234 05/14/2024   Last metabolic panel Lab Results  Component Value Date   GLUCOSE 86 05/14/2024   NA 142  05/14/2024   K 4.0 05/14/2024   CL 107 05/14/2024   CO2 24 05/14/2024   BUN 12 05/14/2024   CREATININE 1.00 05/14/2024   GFRNONAA >60 05/14/2024   CALCIUM  9.7 05/14/2024   PHOS 2.6 01/09/2024   PROT 7.8 05/14/2024   ALBUMIN 4.2 05/14/2024   LABGLOB 2.9 03/29/2022   AGRATIO 1.4 03/29/2022   BILITOT 0.5 05/14/2024   ALKPHOS 105 05/14/2024   AST 23 05/14/2024   ALT 25 05/14/2024   ANIONGAP 10 05/14/2024   Last hemoglobin A1c Lab Results  Component Value Date   HGBA1C 6.0 01/09/2024   Last thyroid  functions Lab Results  Component Value Date   TSH 1.19 01/02/2023   T4TOTAL 6.9 11/13/2014   Last vitamin D  Lab  Results  Component Value Date   VD25OH 29 (L) 12/22/2015   Last vitamin B12 and Folate No results found for: VITAMINB12, FOLATE      Objective:  BP 130/80 (BP Location: Left Arm, Patient Position: Sitting)   Pulse 63   Temp 98.1 F (36.7 C) (Temporal)   Ht 5' 5 (1.651 m)   Wt 235 lb 9.6 oz (106.9 kg)   SpO2 97%   BMI 39.21 kg/m      Physical Exam Vitals and nursing note reviewed.  Constitutional:      General: She is not in acute distress.    Appearance: She is ill-appearing.  Cardiovascular:     Rate and Rhythm: Normal rate and regular rhythm.     Pulses: Normal pulses.     Heart sounds: Normal heart sounds.  Pulmonary:     Effort: Pulmonary effort is normal.     Breath sounds: Normal breath sounds.  Musculoskeletal:     Cervical back: Normal range of motion and neck supple.     Right lower leg: No edema.     Left lower leg: No edema.  Lymphadenopathy:     Cervical: No cervical adenopathy.  Neurological:     Mental Status: She is alert and oriented to person, place, and time.     Cranial Nerves: No cranial nerve deficit.     Results for orders placed or performed in visit on 05/16/24  POCT Mono (Epstein Barr Virus)  Result Value Ref Range   Mono, POC Negative Negative  POC COVID-19  Result Value Ref Range   SARS Coronavirus 2 Ag Negative Negative  POCT rapid strep A  Result Value Ref Range   Rapid Strep A Screen Positive (A) Negative      Assessment & Plan:    Problem List Items Addressed This Visit     Vitamin D  deficiency   Relevant Orders   VITAMIN D  25 Hydroxy (Vit-D Deficiency, Fractures)   Other Visit Diagnoses       Fatigue, unspecified type    -  Primary   Relevant Orders   TSH   Sedimentation rate   C-reactive protein   POCT Mono (Epstein Barr Virus) (Completed)   POC COVID-19 (Completed)   POCT rapid strep A (Completed)   VITAMIN D  25 Hydroxy (Vit-D Deficiency, Fractures)   DG Chest 2 View   B. burgdorfi antibodies   Rocky  mtn spotted fvr abs pnl(IgG+IgM)   CMV abs, IgG+IgM (cytomegalovirus)     Strep pharyngitis       Relevant Medications   amoxicillin  (AMOXIL ) 875 MG tablet      Return in about 2 weeks (around 05/30/2024) for fatigue.     Roselie Mood, NP

## 2024-05-16 NOTE — Patient Instructions (Addendum)
 Negative mono and COVID Positive strep Go to lab Co to [address] for CXR

## 2024-05-18 LAB — C-REACTIVE PROTEIN: CRP: 11 mg/L — AB (ref 0–10)

## 2024-05-18 LAB — SEDIMENTATION RATE: Sed Rate: 46 mm/h — ABNORMAL HIGH (ref 0–40)

## 2024-05-18 LAB — CMV ABS, IGG+IGM (CYTOMEGALOVIRUS)
CMV IgM: <30 [AU]/ml
Cytomegalovirus Ab-IgG: >10 U/mL — ABNORMAL HIGH

## 2024-05-19 ENCOUNTER — Encounter (HOSPITAL_BASED_OUTPATIENT_CLINIC_OR_DEPARTMENT_OTHER): Payer: Self-pay | Admitting: *Deleted

## 2024-05-19 ENCOUNTER — Observation Stay (HOSPITAL_BASED_OUTPATIENT_CLINIC_OR_DEPARTMENT_OTHER)
Admission: EM | Admit: 2024-05-19 | Discharge: 2024-05-20 | Disposition: A | Discharge status: Home / Self Care | Payer: PRIVATE HEALTH INSURANCE | Attending: Internal Medicine | Admitting: Internal Medicine

## 2024-05-19 ENCOUNTER — Ambulatory Visit: Payer: Self-pay | Admitting: Nurse Practitioner

## 2024-05-19 ENCOUNTER — Ambulatory Visit: Payer: Self-pay

## 2024-05-19 ENCOUNTER — Other Ambulatory Visit: Payer: Self-pay

## 2024-05-19 ENCOUNTER — Emergency Department (HOSPITAL_BASED_OUTPATIENT_CLINIC_OR_DEPARTMENT_OTHER): Payer: PRIVATE HEALTH INSURANCE

## 2024-05-19 DIAGNOSIS — Z79899 Other long term (current) drug therapy: Secondary | ICD-10-CM | POA: Insufficient documentation

## 2024-05-19 DIAGNOSIS — B259 Cytomegaloviral disease, unspecified: Secondary | ICD-10-CM | POA: Insufficient documentation

## 2024-05-19 DIAGNOSIS — E669 Obesity, unspecified: Secondary | ICD-10-CM | POA: Diagnosis not present

## 2024-05-19 DIAGNOSIS — J45909 Unspecified asthma, uncomplicated: Secondary | ICD-10-CM | POA: Insufficient documentation

## 2024-05-19 DIAGNOSIS — R55 Syncope and collapse: Principal | ICD-10-CM | POA: Insufficient documentation

## 2024-05-19 DIAGNOSIS — I10 Essential (primary) hypertension: Secondary | ICD-10-CM | POA: Diagnosis not present

## 2024-05-19 DIAGNOSIS — K219 Gastro-esophageal reflux disease without esophagitis: Secondary | ICD-10-CM | POA: Diagnosis not present

## 2024-05-19 DIAGNOSIS — E785 Hyperlipidemia, unspecified: Secondary | ICD-10-CM | POA: Insufficient documentation

## 2024-05-19 DIAGNOSIS — R42 Dizziness and giddiness: Secondary | ICD-10-CM

## 2024-05-19 DIAGNOSIS — Z6838 Body mass index (BMI) 38.0-38.9, adult: Secondary | ICD-10-CM | POA: Insufficient documentation

## 2024-05-19 LAB — COMPREHENSIVE METABOLIC PANEL WITH GFR
ALT: 25 U/L (ref 0–44)
AST: 25 U/L (ref 15–41)
Albumin: 4.2 g/dL (ref 3.5–5.0)
Alkaline Phosphatase: 112 U/L (ref 38–126)
Anion gap: 13 (ref 5–15)
BUN: 14 mg/dL (ref 8–23)
CO2: 21 mmol/L — ABNORMAL LOW (ref 22–32)
Calcium: 9.9 mg/dL (ref 8.9–10.3)
Chloride: 105 mmol/L (ref 98–111)
Creatinine, Ser: 1.03 mg/dL — ABNORMAL HIGH (ref 0.44–1.00)
GFR, Estimated: >60 mL/min (ref >60)
Glucose, Bld: 99 mg/dL (ref 70–99)
Potassium: 3.9 mmol/L (ref 3.5–5.1)
Sodium: 139 mmol/L (ref 135–145)
Total Bilirubin: 0.6 mg/dL (ref 0.0–1.2)
Total Protein: 8 g/dL (ref 6.5–8.1)

## 2024-05-19 LAB — CBC
HCT: 45.1 % (ref 36.0–46.0)
Hemoglobin: 14.7 g/dL (ref 12.0–15.0)
MCH: 27.9 pg (ref 26.0–34.0)
MCHC: 32.6 g/dL (ref 30.0–36.0)
MCV: 85.7 fL (ref 80.0–100.0)
Platelets: 245 K/uL (ref 150–400)
RBC: 5.26 MIL/uL — ABNORMAL HIGH (ref 3.87–5.11)
RDW: 14 % (ref 11.5–15.5)
WBC: 4.4 K/uL (ref 4.0–10.5)
nRBC: 0 % (ref 0.0–0.2)

## 2024-05-19 LAB — CK: Total CK: 46 U/L (ref 38–234)

## 2024-05-19 LAB — URINALYSIS, ROUTINE W REFLEX MICROSCOPIC
Bilirubin Urine: NEGATIVE
Glucose, UA: NEGATIVE mg/dL
Hgb urine dipstick: NEGATIVE
Ketones, ur: NEGATIVE mg/dL
Leukocytes,Ua: NEGATIVE
Nitrite: NEGATIVE
Protein, ur: NEGATIVE mg/dL
Specific Gravity, Urine: <1.005 — ABNORMAL LOW (ref 1.005–1.030)
pH: 6 (ref 5.0–8.0)

## 2024-05-19 LAB — TROPONIN T, HIGH SENSITIVITY
Troponin T High Sensitivity: <15 ng/L (ref <19)
Troponin T High Sensitivity: <15 ng/L (ref <19)

## 2024-05-19 LAB — PRO BRAIN NATRIURETIC PEPTIDE: Pro Brain Natriuretic Peptide: <36 pg/mL (ref <300.0)

## 2024-05-19 MED ORDER — LACTATED RINGERS IV BOLUS
1000.0000 mL | Freq: Once | INTRAVENOUS | Status: AC
  Administered 2024-05-19: 1000 mL via INTRAVENOUS

## 2024-05-19 NOTE — Telephone Encounter (Signed)
 Called patient due to her MyChart message. Patient informed me that the fatigue and weakness became worse over the weekend. That she had to use her walker and when she would use the bathroom she was weak didn't have energy felt like she was going to pass out. Saturday night going into Sunday morning some improvement still not energy. Patient asked what else could be done and asked about a work note stated that she has been out of work since Wednesday. Her son asked about the abnormal labs and what do they mean? I informed him that I will pass this and his mom's concerns to [facility] and I will give them a call back with Claire Sutton's comments/recommendations. Both thanked me for calling and will be looking for my call back

## 2024-05-19 NOTE — ED Notes (Signed)
 [facility] at bedside to transport pt to [facility]

## 2024-05-19 NOTE — Telephone Encounter (Signed)
 FYI Only or Action Required?: FYI only for provider.  Patient was last seen in primary care on 05/16/2024 by Nche, Roselie Rockford, NP.  Called Nurse Triage reporting Fatigue.  Symptoms began several days ago.  Interventions attempted: Prescription medications: antibiotics  and Rest, hydration, or home remedies.  Symptoms are: rapidly worsening.  Triage Disposition: No disposition on file.  Patient/caregiver understands and will follow disposition?:      Copied from CRM [id number]. Topic: Clinical - Red Word Triage >> May 19, 2024  3:49 PM Claire Sutton wrote: [facility] that prompted transfer to Nurse Triage: extreme fatigue , PT son Claire Sutton calling on behalf of mom  says her pain is worst,  teeth are hurting and chettering, fatigue , PT went to ER lasT wednesday. PT was put on antiobiotics, and attempted to wash her hands and fell into the bed. Reason for Disposition  Patient sounds very sick or weak to the triager  Answer Assessment - Initial Assessment Questions 1. DESCRIPTION: Describe how you are feeling.     Fatigue and weakness 2. SEVERITY: How bad is it?  Can you stand and walk?     Barely-  3. ONSET: When did these symptoms begin? (e.g., hours, days, weeks, months)     About 4 days ago   4. CAUSE: What do you think is causing the weakness or fatigue? (e.g., not drinking enough fluids, medical problem, trouble sleeping)     yes 5. NEW MEDICINES:  Have you started on any new medicines recently? (e.g., opioid pain medicines, benzodiazepines, muscle relaxants, antidepressants, antihistamines, neuroleptics, beta blockers)     Antibiotic 6. OTHER SYMPTOMS: Do you have any other symptoms? (e.g., chest pain, fever, cough, SOB, vomiting, diarrhea, bleeding, other areas of pain)     weakness  Protocols used: Weakness (Generalized) and Fatigue-A-AH

## 2024-05-19 NOTE — ED Notes (Addendum)
 Pt ambulates to the bathroom with two assist. Pt was stating she felt weak. Ambulated back to bed and this tech took BP which was 203/88 MAP (122). Pt stated she felt tired and her teeth were hurting. EKG taken and RN notified.

## 2024-05-19 NOTE — Progress Notes (Signed)
 Hospitalist Transfer Note:    Nursing staff, Please call [facility] Admits & Consults System-Wide number on Amion [phone]) as soon as patient's arrival, so appropriate admitting provider can evaluate the pt.   Transferring facility: [facility] Requesting provider: Dr. Caron Salt (EDP at [facility]) Reason for transfer: admission for further evaluation and management of recurrent episodes of presyncope.   63 year old female with history of essential hypertension, hyperlipidemia, who presented to [facility] ED complaining of recurrent episodes of dizziness, lightheadedness, near syncope when moving from a seated to standing position over the last few days.  She conveys that none of these episodes resulted in loss of consciousness, although a few of the episodes did result in falls, but conveys that the patient's son was right next to her at the time of these falls and able to catch her every time.  Consequently, the patient did not incur any overt trauma as a result of these episodes, and conveys that she is not struck her knees hips head or neck as a result of these presyncopal episodes.  These episodes not been associated with any recent chest pain or shortness of breath.  However, she does note, some generalized weakness over the last few days, for which she had presented to the emergency department on 05/14/2024.  Workup at that time was reported to be reassuring, including MRI of the brain that showed no evidence of acute process, including no evidence of acute stroke, and the patient was subsequently discharged to home.  After being discharged to home from the emergency department on 05/14/2024, she followed up with her PCP, who pursued additional evaluation, including a rapid strep test that was reported to be positive, prompting initiation of amoxicillin .  Most recent echocardiogram occurred as component of a stress echo in December 2017.  Vital signs in the ED were notable for the following: Afebrile; heart rates  in the 60s to 80s; systolic blood pressures in the 140s to 150s, although there was one documented SBP of 190 mmHg. no report of orthostatic vital sign results.  Labs were notable for nonelevated troponin.  EKG is reported to show sinus rhythm without overt evidence of acute ischemic changes.  Imaging notable for chest x-ray, which is reported to show no evidence of acute cardiopulmonary process.  Subsequently, I accepted this patient for transfer for observation to a med-tele bed at [facility] for further work-up and management of the above.      Eva Pore, DO Hospitalist

## 2024-05-19 NOTE — ED Triage Notes (Signed)
 Patient to the ED reporting continued and worsening weakness from ED visit last week. Patient has followed up with PCP who dx Strep and a type of Herpes and found irregularities with her glucose but was otherwise reportedly inconclusive for increasing fatigue.   Patient reports continued left sided weakness, specifically in the left arm and leg.

## 2024-05-19 NOTE — Telephone Encounter (Signed)
 Patient advised of going to ED if symptoms worsen.

## 2024-05-19 NOTE — ED Provider Notes (Signed)
 [place] EMERGENCY DEPARTMENT AT [facility] Provider Note   CSN: [id number] Arrival date & time: 05/19/24  1730     Patient presents with: Weakness   Claire Sutton is a 63 y.o. female.   This 63 year old female presenting emergency department with weakness and near syncope.  She has been feeling fatigued and with generalized weakness since this past Tuesday.  Reports having workup at at [facility] which was negative for stroke.  Followed up with primary doctor and was placed on antibiotics for strep.  She notes over the weekend she has had numerous episodes of near syncope with exertion.  Feels extremely lightheaded.  Son at bedside notes that he has had to catch her couple times, does not fully pass out, but states that she is unable to speak.   Weakness      Prior to Admission medications   Medication Sig Start Date End Date Taking? Authorizing Provider  albuterol  (VENTOLIN  HFA) 108 (90 Base) MCG/ACT inhaler Inhale 2 puffs into the lungs every 6 (six) hours as needed for wheezing or shortness of breath. 01/09/24   Nche, Roselie Rockford, NP  amLODipine  (NORVASC ) 5 MG tablet Take 1 tablet (5 mg total) by mouth daily. 01/09/24   Nche, Roselie Rockford, NP  amoxicillin  (AMOXIL ) 875 MG tablet Take 1 tablet (875 mg total) by mouth 2 (two) times daily for 10 days. 05/16/24 05/26/24  Nche, Roselie Rockford, NP  atorvastatin  (LIPITOR) 20 MG tablet TAKE 1 TABLET(20 MG) BY MOUTH DAILY 01/09/24   Nche, Roselie Rockford, NP  cetirizine (ZYRTEC) 10 MG tablet Take by mouth. 12/03/15   [provider]  Cholecalciferol  (VITAMIN D  PO) Take 1 tablet by mouth daily.     [provider]  Cyanocobalamin (B-12) 1000 MCG CAPS Take by mouth.    [provider]  Diclofenac  Sodium (PENNSAID ) 2 % SOLN Apply 1 inch topically 2 (two) times daily. Patient not taking: Reported on 05/16/2024 10/21/20   Douglass Caul B, FNP  EPINEPHrine  0.3 mg/0.3 mL IJ SOAJ injection Inject 0.3 mg into the  muscle as needed for anaphylaxis. 05/27/21   Nche, Roselie Rockford, NP  losartan  (COZAAR ) 50 MG tablet Take 1 tablet (50 mg total) by mouth daily. 01/09/24   Nche, Charlotte Lum, NP  mometasone  (NASONEX ) 50 MCG/ACT nasal spray SHAKE LIQUID AND USE 2 SPRAYS IN EACH NOSTRIL DAILY 02/29/24   Nche, Roselie Rockford, NP  montelukast  (SINGULAIR ) 10 MG tablet TAKE 1 TABLET(10 MG) BY MOUTH AT BEDTIME 01/09/24   Nche, Roselie Rockford, NP  Multiple Vitamins-Calcium  (ONE-A-DAY WOMENS PO) Take 1 tablet by mouth daily.     [provider]  omeprazole  (PRILOSEC) 20 MG capsule Take 1 capsule (20 mg total) by mouth daily. 01/09/24   Nche, Roselie Rockford, NP  prednisoLONE acetate (PRED FORTE) 1 % ophthalmic suspension Place 1 drop into the left eye 4 (four) times daily. 12/04/23   [provider]  Zinc Sulfate (ZINC-220 PO) Take by mouth. Patient not taking: Reported on 05/16/2024    [provider]    Allergies: Codeine and Cyclobenzaprine     Review of Systems  Neurological:  Positive for weakness.    Updated Vital Signs BP (!) 144/73   Pulse 64   Temp 98 F (36.7 C) (Oral)   Resp 20   SpO2 97%   Physical Exam Vitals and nursing note reviewed.  Constitutional:      General: She is not in acute distress.    Appearance: She is obese. She  is not toxic-appearing.  HENT:     Head: Normocephalic.     Mouth/Throat:     Mouth: Mucous membranes are moist.  Eyes:     Conjunctiva/sclera: Conjunctivae normal.  Cardiovascular:     Rate and Rhythm: Normal rate and regular rhythm.  Pulmonary:     Effort: Pulmonary effort is normal.     Breath sounds: Normal breath sounds.  Abdominal:     General: Abdomen is flat. There is no distension.     Tenderness: There is no abdominal tenderness. There is no guarding or rebound.  Musculoskeletal:     Right lower leg: No edema.     Left lower leg: No edema.  Skin:    General: Skin is warm.     Capillary Refill: Capillary refill takes less than 2  seconds.  Neurological:     General: No focal deficit present.     Mental Status: She is alert and oriented to person, place, and time.     (all labs ordered are listed, but only abnormal results are displayed) Labs Reviewed  COMPREHENSIVE METABOLIC PANEL WITH GFR - Abnormal; Notable for the following components:      Result Value   CO2 21 (*)    Creatinine, Ser 1.03 (*)    All other components within normal limits  CBC - Abnormal; Notable for the following components:   RBC 5.26 (*)    All other components within normal limits  URINALYSIS, ROUTINE W REFLEX MICROSCOPIC - Abnormal; Notable for the following components:   Color, Urine COLORLESS (*)    Specific Gravity, Urine <1.005 (*)    All other components within normal limits  PRO BRAIN NATRIURETIC PEPTIDE  CK  CBG MONITORING, ED  TROPONIN T, HIGH SENSITIVITY  TROPONIN T, HIGH SENSITIVITY    EKG: EKG Interpretation Date/Time:  Monday May 19 2024 18:02:41 EDT Ventricular Rate:  79 PR Interval:  152 QRS Duration:  82 QT Interval:  372 QTC Calculation: 426 R Axis:   -42  Text Interpretation: Normal sinus rhythm Left axis deviation Abnormal ECG When compared with ECG of 14-May-2024 13:18, PREVIOUS ECG IS PRESENT Confirmed by Neysa Clap [phone]) on 05/19/2024 6:48:45 PM  Radiology: [facility] Chest Portable 1 View Result Date: 05/19/2024 CLINICAL DATA:  Presyncope EXAM: PORTABLE CHEST 1 VIEW COMPARISON:  05/16/2024 FINDINGS: The heart size and mediastinal contours are within normal limits. Both lungs are clear. The visualized skeletal structures are unremarkable. IMPRESSION: No active disease. Electronically Signed   By: Dorethia Molt M.D.   On: 05/19/2024 20:04     Procedures   Medications Ordered in the ED  lactated ringers  bolus 1,000 mL (0 mLs Intravenous Stopped 05/19/24 2021)                                    Medical Decision Making This is a 63 year old female presenting emergency department for weakness and  near syncope.  She is afebrile nontachycardic, hypertensive per maintaining oxygen saturation on room air.  Per chart review had negative MRI recently for possible stroke.  Her EKG appears to be normal sinus rhythm without ST segment changes to indicate ischemia.  Troponin and BNP negative.  Concern for possible cardiac etiology given her exertional symptoms.  Chest x-ray without pneumonia or pneumothorax.  No obvious infectious etiology with no fever or tachycardia or leukocytosis.  UA negative.  No significant metabolic derangements.  Normal kidney function.  No  transaminitis this just hepatobiliary disease.  Was given a liter of IV fluids with some mild improvement of her symptoms.  However, given numerous episodes over the past several days that are worsened with exertion will admit for echo/syncope workup.  Amount and/or Complexity of Data Reviewed Independent Historian:     Details: Son notes that he has essentially had to catch mother a couple times because she essentially goes limp. External Data Reviewed: radiology.    Details: Normal MRI of brain couple days ago.  Also appears to had a positive rapid strep test and was placed on Amoxil  by PCP Labs: ordered. Decision-making details documented in ED Course. Radiology: ordered. ECG/medicine tests: ordered and independent interpretation performed.  Risk Decision regarding hospitalization. Diagnosis or treatment significantly limited by social determinants of health. Risk Details: Poor health literacy      Final diagnoses:  Near syncope    ED Discharge Orders     None          Neysa Caron PARAS, DO 05/19/24 [id number]

## 2024-05-19 NOTE — ED Notes (Signed)
 Pt was unstable while doing orthostatic VS. Could not complete the three minute standing portion. RN notified.

## 2024-05-20 DIAGNOSIS — R42 Dizziness and giddiness: Secondary | ICD-10-CM

## 2024-05-20 DIAGNOSIS — R55 Syncope and collapse: Secondary | ICD-10-CM

## 2024-05-20 LAB — CBC
HCT: 42.4 % (ref 36.0–46.0)
Hemoglobin: 13.8 g/dL (ref 12.0–15.0)
MCH: 28 pg (ref 26.0–34.0)
MCHC: 32.5 g/dL (ref 30.0–36.0)
MCV: 86.2 fL (ref 80.0–100.0)
Platelets: 206 K/uL (ref 150–400)
RBC: 4.92 MIL/uL (ref 3.87–5.11)
RDW: 13.7 % (ref 11.5–15.5)
WBC: 4.5 K/uL (ref 4.0–10.5)
nRBC: 0 % (ref 0.0–0.2)

## 2024-05-20 LAB — BASIC METABOLIC PANEL WITH GFR
Anion gap: 7 (ref 5–15)
BUN: 10 mg/dL (ref 8–23)
CO2: 27 mmol/L (ref 22–32)
Calcium: 9.2 mg/dL (ref 8.9–10.3)
Chloride: 107 mmol/L (ref 98–111)
Creatinine, Ser: 0.96 mg/dL (ref 0.44–1.00)
GFR, Estimated: >60 mL/min (ref >60)
Glucose, Bld: 92 mg/dL (ref 70–99)
Potassium: 3.7 mmol/L (ref 3.5–5.1)
Sodium: 141 mmol/L (ref 135–145)

## 2024-05-20 LAB — ROCKY MTN SPOTTED FVR ABS PNL(IGG+IGM)
RMSF IgG: NOT DETECTED
RMSF IgM: NOT DETECTED

## 2024-05-20 LAB — B. BURGDORFI ANTIBODIES: B burgdorferi Ab IgG+IgM: <0.9 {index}

## 2024-05-20 LAB — PHOSPHORUS: Phosphorus: 3.9 mg/dL (ref 2.5–4.6)

## 2024-05-20 LAB — HIV ANTIBODY (ROUTINE TESTING W REFLEX): HIV Screen 4th Generation wRfx: NONREACTIVE

## 2024-05-20 LAB — MAGNESIUM: Magnesium: 1.9 mg/dL (ref 1.7–2.4)

## 2024-05-20 MED ORDER — ADULT MULTIVITAMIN W/MINERALS CH
1.0000 | ORAL_TABLET | Freq: Every day | ORAL | Status: DC
  Administered 2024-05-20: 1 via ORAL
  Filled 2024-05-20: qty 1

## 2024-05-20 MED ORDER — PROCHLORPERAZINE EDISYLATE 10 MG/2ML IJ SOLN: 5.0000 mg | Freq: Four times a day (QID) | INTRAMUSCULAR | Status: DC | PRN

## 2024-05-20 MED ORDER — LACTATED RINGERS IV SOLN: INTRAVENOUS | Status: DC

## 2024-05-20 MED ORDER — ALBUTEROL SULFATE (2.5 MG/3ML) 0.083% IN NEBU: 3.0000 mL | INHALATION_SOLUTION | Freq: Four times a day (QID) | RESPIRATORY_TRACT | Status: DC | PRN

## 2024-05-20 MED ORDER — MONTELUKAST SODIUM 10 MG PO TABS: 10.0000 mg | ORAL_TABLET | Freq: Every day | ORAL | Status: DC

## 2024-05-20 MED ORDER — AMLODIPINE BESYLATE 5 MG PO TABS
5.0000 mg | ORAL_TABLET | Freq: Every day | ORAL | Status: DC
  Administered 2024-05-20: 5 mg via ORAL
  Filled 2024-05-20: qty 1

## 2024-05-20 MED ORDER — VITAMIN D 25 MCG (1000 UNIT) PO TABS
1000.0000 [IU] | ORAL_TABLET | Freq: Every day | ORAL | Status: DC
  Administered 2024-05-20: 1000 [IU] via ORAL
  Filled 2024-05-20: qty 1

## 2024-05-20 MED ORDER — AMOXICILLIN 500 MG PO CAPS: 1000.0000 mg | ORAL_CAPSULE | Freq: Two times a day (BID) | ORAL | Status: DC

## 2024-05-20 MED ORDER — MELATONIN 5 MG PO TABS: 5.0000 mg | ORAL_TABLET | Freq: Every evening | ORAL | Status: DC | PRN

## 2024-05-20 MED ORDER — AMOXICILLIN 500 MG PO CAPS
1000.0000 mg | ORAL_CAPSULE | Freq: Two times a day (BID) | ORAL | Status: DC
  Administered 2024-05-20: 1000 mg via ORAL
  Filled 2024-05-20: qty 2

## 2024-05-20 MED ORDER — ACETAMINOPHEN 325 MG PO TABS
650.0000 mg | ORAL_TABLET | Freq: Four times a day (QID) | ORAL | Status: DC | PRN
  Administered 2024-05-20: 650 mg via ORAL
  Filled 2024-05-20: qty 2

## 2024-05-20 MED ORDER — PANTOPRAZOLE SODIUM 40 MG PO TBEC
40.0000 mg | DELAYED_RELEASE_TABLET | Freq: Every day | ORAL | Status: DC
  Administered 2024-05-20: 40 mg via ORAL
  Filled 2024-05-20: qty 1

## 2024-05-20 MED ORDER — POLYETHYLENE GLYCOL 3350 17 G PO PACK
17.0000 g | PACK | Freq: Every day | ORAL | Status: DC | PRN
  Administered 2024-05-20: 17 g via ORAL
  Filled 2024-05-20: qty 1

## 2024-05-20 MED ORDER — ATORVASTATIN CALCIUM 10 MG PO TABS
20.0000 mg | ORAL_TABLET | Freq: Every day | ORAL | Status: DC
  Administered 2024-05-20: 20 mg via ORAL
  Filled 2024-05-20: qty 2

## 2024-05-20 MED ORDER — ENOXAPARIN SODIUM 40 MG/0.4ML IJ SOSY
40.0000 mg | PREFILLED_SYRINGE | Freq: Every day | INTRAMUSCULAR | Status: DC
  Filled 2024-05-20: qty 0.4

## 2024-05-20 NOTE — H&P (Addendum)
 History and Physical  Claire Sutton FMW:[id number] DOB: 1960-11-23 DOA: 05/19/2024  Referring physician: Accepted by Dr. Marcene, [facility], hospitalist service. PCP: Katheen Roselie Rockford, NP  Outpatient Specialists: None Patient coming from: Home through [facility] ED.  Chief Complaint: Generalized weakness  HPI: Claire Sutton is a 62 y.o. female with medical history significant for hypertension, hyperlipidemia, who initially presented to [facility] ED with complaints of profound generalized weakness since Tuesday about a week ago, associated with myalgia more on the left side of her body than on the right side.  No falls or loss of consciousness.  Recently started on amoxicillin  by her PCP for group A strep infection (Friday 05/16/14).  Workup done outpatient revealed CMV antibody IgG positive greater than 10 and CMV IgM less than 30.0, rapid strep a screen positive, sed rate 46, CRP 11.  In the ER, weak appearing.  Additional workup was unrevealing.  Chest x-ray was nonacute, UA negative for pyuria, afebrile with no leukocytosis and stable vital signs.  Troponin negative x 2, no evidence of acute ischemia on her twelve-lead EKG.  EDP requested admission for further workup.  Admitted by Dr. Marcene, [facility], hospitalist service.  ED Course: Temperature 97.8.  BP 153/67, pulse 64, respiratory rate 18.  O2 saturation 95 percent on Room Air.  Review of Systems: Review of systems as noted in the HPI. All other systems reviewed and are negative.   Past Medical History:  Diagnosis Date   Allergy     ASCUS (atypical squamous cells of undetermined significance) on Pap smear 07/2006   NEG HIGH RISK HPV-- THEN IN 10/2006 AND 07/2007 PAPS WITH BENIGN REACTIVE CHANGES   Asthma    Cataract 2024   bilateral   GERD (gastroesophageal reflux disease)    Headache    History of blood clot in brain    a. Fall age 64 - was told she had a blood clot in the brain from this.   Hyperlipidemia    Hypertension     Iritis of both eyes    LGSIL (low grade squamous intraepithelial dysplasia) 08/2004   PAP ON 02/2005, 7,8,9,10,11,12WNL   Obesity    Seizures (HCC)    a. began after she had a fall at age 31, last seizure age 32.   Past Surgical History:  Procedure Laterality Date   CATARACT EXTRACTION, BILATERAL Bilateral 11/23/2022   COLPOSCOPY     ENDOMETRIAL ABLATION  02/2007   NOVASURE/MARSUPILIZATION OF BARTHOLIN   EYE SURGERY  11/09/2022 and 11/23/2022   Cataract surgery   MARSUPILIZATION OF BARTHOLIN  02/2007   AT TIME OF NOVASURE ABLATION SURGERY   MOUTH SURGERY     RETINAL LASER PROCEDURE Left 12/12/2023   TUBAL LIGATION      Social History:  reports that she has never smoked. She has been exposed to tobacco smoke. She has never used smokeless tobacco. She reports that she does not drink alcohol and does not use drugs.   Allergies  Allergen Reactions   Codeine     COLD SWEAT, LIGHT-HEADEDNESS. Cold sweats   Cyclobenzaprine  Rash    Family History  Problem Relation Age of Onset   Hypertension Mother    Hyperlipidemia Mother    Heart disease Mother        was told she may have had a few MIs that she was unaware of   Diabetes Father    Arthritis Sister        lumbar DDD with spinal stenosis   Hypertension Sister  Diabetes Paternal Grandfather    Colon cancer Neg Hx    Colon polyps Neg Hx    Esophageal cancer Neg Hx    Stomach cancer Neg Hx    Rectal cancer Neg Hx    Breast cancer Neg Hx       Prior to Admission medications   Medication Sig Start Date End Date Taking? Authorizing Provider  albuterol  (VENTOLIN  HFA) 108 (90 Base) MCG/ACT inhaler Inhale 2 puffs into the lungs every 6 (six) hours as needed for wheezing or shortness of breath. 01/09/24   Nche, Roselie Rockford, NP  amLODipine  (NORVASC ) 5 MG tablet Take 1 tablet (5 mg total) by mouth daily. 01/09/24   Nche, Roselie Rockford, NP  amoxicillin  (AMOXIL ) 875 MG tablet Take 1 tablet (875 mg total) by mouth 2 (two) times daily  for 10 days. 05/16/24 05/26/24  Nche, Roselie Rockford, NP  atorvastatin  (LIPITOR) 20 MG tablet TAKE 1 TABLET(20 MG) BY MOUTH DAILY 01/09/24   Nche, Roselie Rockford, NP  cetirizine (ZYRTEC) 10 MG tablet Take by mouth. 12/03/15   [provider]  Cholecalciferol  (VITAMIN D  PO) Take 1 tablet by mouth daily.     [provider]  Cyanocobalamin (B-12) 1000 MCG CAPS Take by mouth.    [provider]  Diclofenac  Sodium (PENNSAID ) 2 % SOLN Apply 1 inch topically 2 (two) times daily. Patient not taking: Reported on 05/16/2024 10/21/20   Douglass Caul B, FNP  EPINEPHrine  0.3 mg/0.3 mL IJ SOAJ injection Inject 0.3 mg into the muscle as needed for anaphylaxis. 05/27/21   Nche, Roselie Rockford, NP  losartan  (COZAAR ) 50 MG tablet Take 1 tablet (50 mg total) by mouth daily. 01/09/24   Nche, Roselie Rockford, NP  mometasone  (NASONEX ) 50 MCG/ACT nasal spray SHAKE LIQUID AND USE 2 SPRAYS IN EACH NOSTRIL DAILY 02/29/24   Nche, Roselie Rockford, NP  montelukast  (SINGULAIR ) 10 MG tablet TAKE 1 TABLET(10 MG) BY MOUTH AT BEDTIME 01/09/24   Nche, Roselie Rockford, NP  Multiple Vitamins-Calcium  (ONE-A-DAY WOMENS PO) Take 1 tablet by mouth daily.     [provider]  omeprazole  (PRILOSEC) 20 MG capsule Take 1 capsule (20 mg total) by mouth daily. 01/09/24   Nche, Roselie Rockford, NP  prednisoLONE acetate (PRED FORTE) 1 % ophthalmic suspension Place 1 drop into the left eye 4 (four) times daily. 12/04/23   [provider]  Zinc Sulfate (ZINC-220 PO) Take by mouth. Patient not taking: Reported on 05/16/2024    [provider]    Physical Exam: BP (!) 153/67 (BP Location: Right Arm)   Pulse 61   Temp 97.8 F (36.6 C)   Resp 18   Ht 5' 5 (1.651 m)   Wt 105.5 kg   SpO2 95%   BMI 38.70 kg/m   General: 63 y.o. year-old female well developed well nourished in no acute distress.  Alert and oriented x3. Cardiovascular: Regular rate and rhythm with no rubs or gallops.  No thyromegaly or JVD noted.   No lower extremity edema. 2/4 pulses in all 4 extremities. Respiratory: Clear to auscultation with no wheezes or rales. Good inspiratory effort. Abdomen: Soft nontender nondistended with normal bowel sounds x4 quadrants. Muskuloskeletal: No cyanosis, clubbing or edema noted bilaterally Neuro: CN II-XII intact, strength, sensation, reflexes Skin: No ulcerative lesions noted or rashes Psychiatry: Judgement and insight appear normal. Mood is appropriate for condition and setting          Labs on Admission:  Basic Metabolic Panel: Recent Labs  Lab 05/14/24  1355 05/14/24 2015 05/19/24 1757  NA 142  --  139  K 4.0  --  3.9  CL 107  --  105  CO2 24  --  21*  GLUCOSE 86  --  99  BUN 12  --  14  CREATININE 1.00  --  1.03*  CALCIUM  9.7  --  9.9  MG  --  1.7  --    Liver Function Tests: Recent Labs  Lab 05/14/24 1355 05/19/24 1757  AST 23 25  ALT 25 25  ALKPHOS 105 112  BILITOT 0.5 0.6  PROT 7.8 8.0  ALBUMIN 4.2 4.2   No results for input(s): LIPASE, AMYLASE in the last 168 hours. No results for input(s): AMMONIA in the last 168 hours. CBC: Recent Labs  Lab 05/14/24 1355 05/14/24 1438 05/19/24 1757  WBC 4.1 4.0 4.4  NEUTROABS  --  2.0  --   HGB 14.0 13.9 14.7  HCT 43.4 42.9 45.1  MCV 86.3 86.7 85.7  PLT 228 234 245   Cardiac Enzymes: Recent Labs  Lab 05/14/24 2015 05/19/24 1757  CKTOTAL 61 46    BNP (last 3 results) No results for input(s): BNP in the last 8760 hours.  ProBNP (last 3 results) Recent Labs    05/19/24 1757  PROBNP <36.0    CBG: Recent Labs  Lab 05/14/24 1356 05/14/24 1742  GLUCAP 74 89    Radiological Exams on Admission: DG Chest Portable 1 View Result Date: 05/19/2024 CLINICAL DATA:  Presyncope EXAM: PORTABLE CHEST 1 VIEW COMPARISON:  05/16/2024 FINDINGS: The heart size and mediastinal contours are within normal limits. Both lungs are clear. The visualized skeletal structures are unremarkable. IMPRESSION: No active  disease. Electronically Signed   By: Dorethia Molt M.D.   On: 05/19/2024 20:04    EKG: I independently viewed the EKG done and my findings are as followed: Normal sinus rhythm rate of 79.  Nonspecific ST-T changes.  QTc 426.  Assessment/Plan Present on Admission: **None**  Principal Problem:   Postural dizziness with presyncope  Profound generalized weakness/presyncope, suspect multifactorial In the setting of group A strep infection and possible viral infection Workup done outpatient revealed CMV antibody IgG positive greater than 10 and CMV IgM less than 30.0, rapid strep a screen positive, sed rate 46, CRP 11. Follow CMV DNA by PCR Resume home amoxicillin  Follow orthostatic vital signs IV fluid hydration PT/OT/evaluation Fall precautions  CMV antibody IgG positive greater than 10 Follow CMV DNA by PCR to rule out reactivation of CMV Continue standard precautions  Hypertension BP is not at goal, elevated. Restart home amlodipine  Monitor vital signs  Hyperlipidemia Resume home regimen.  GERD Resume home regimen.  Recently diagnosis of group A strep infection at outside facility Resume home amoxicillin  day number 5 out of 10 Admits to compliance with antibiotic regimen since Friday, 5 days ago.  Obesity BMI 38 Recommend weight loss outpatient    Time: 75 minutes.    DVT prophylaxis: Subcu Lovenox  daily.  Code Status: Full code.  Family Communication: The patient's son at bedside.  Disposition Plan: Admitted to telemetry medical unit.  Consults called: None.  Admission status: Observation status.   Status is: Observation    Terry LOISE Hurst MD [facility] Pager [phone]  If 7PM-7AM, please contact night-coverage [email or web address] Password [email or web address]  05/20/2024, 2:21 AM

## 2024-05-20 NOTE — Evaluation (Signed)
 Physical Therapy Evaluation  Patient Details Name: Claire Sutton MRN: [id number] DOB: 26-Mar-1961 Today's Date: 05/20/2024  History of Present Illness  Claire Sutton is a 63 y.o. female who initially presented to [facility] ED with complaints of profound generalized weakness for about a week, associated with myalgia more on the left side of her body than right.  PMH: HTN, HLD, recently on amoxicillin  05/16/14 for group A strep infection   Clinical Impression  Pt admitted with above. PTA pt was indep, lived alone, drove, worked, and did her own yard work. Pt presenting with fatigue, lethargy, periods of delayed response time and difficulty speaking leading to tremors and ataxic gait pattern increasing patients fall risk and requiring use of RW and physical assist to ambulate safely. At this time patient is not safe to return home alone and would need 24/7 assist and home health PT to address above deficits. At this time also recommending use of RW for ambulation. Acute PT to cont to follow and reassess mobility and d/c recommendations as medical plan evolves.   BP sitting EOB 142/75 BP in bathroom at time of episode of dysphasia: 127/86 Bp once returned to EOB from bathroom: 151/92 BP in supine: 156/79  SpO2 97% on RA, 71 HR when on commode      If plan is discharge home, recommend the following: A little help with walking and/or transfers;A little help with bathing/dressing/bathroom;Help with stairs or ramp for entrance;Assist for transportation   Can travel by private vehicle        Equipment Recommendations Rolling walker (2 wheels)  Recommendations for Other Services       Functional Status Assessment Patient has had a recent decline in their functional status and demonstrates the ability to make significant improvements in function in a reasonable and predictable amount of time.     Precautions / Restrictions Precautions Precautions: Fall Restrictions Weight Bearing  Restrictions Per Provider Order: No      Mobility  Bed Mobility Overal bed mobility: Needs Assistance Bed Mobility: Supine to Sit, Sit to Supine     Supine to sit: Min assist Sit to supine: Supervision   General bed mobility comments: minA for trunk elevation, increased time, reports onset of fatigue    Transfers Overall transfer level: Needs assistance Equipment used: Rolling walker (2 wheels) Transfers: Sit to/from Stand Sit to Stand: Contact guard assist, Min assist           General transfer comment: increased time, verbal cues for hand placement, contact guard from bed, minA from toilet due to lower surface height, definite use of L hand rail    Ambulation/Gait Ambulation/Gait assistance: Contact guard assist, Min assist Gait Distance (Feet): 12 Feet (x2) Assistive device: Rolling walker (2 wheels) Gait Pattern/deviations: Step-through pattern, Decreased stride length, Ataxic Gait velocity: dec     General Gait Details: pt with slow, guarded, cautious gait pattern with increased UE dependency to the bathroom, pt with shakiness and ataxic gait pattern requiring minA for ambulation on the way back with 2 episodes of LOB  Stairs            Wheelchair Mobility     Tilt Bed    Modified Rankin (Stroke Patients Only)       Balance Overall balance assessment: Needs assistance Sitting-balance support: Feet supported, No upper extremity supported Sitting balance-Leahy Scale: Fair     Standing balance support: Bilateral upper extremity supported, During functional activity, Reliant on assistive device for balance Standing balance-Leahy Scale: Poor  Standing balance comment: dependent on RW                             Pertinent Vitals/Pain Pain Assessment Pain Assessment: No/denies pain    Home Living Family/patient expects to be discharged to:: Private residence Living Arrangements: Alone Available Help at Discharge: Family;Available 24  hours/day (son is available for the summer (he's a Runner, broadcasting/film/video)) Type of Home: House Home Access: Stairs to enter Entrance Stairs-Rails: None Entrance Stairs-Number of Steps: 2 (1 step up onto porch, 1 step into house)   Home Layout: One level Home Equipment: None      Prior Function Prior Level of Function : Independent/Modified Independent;Working/employed;Driving             Mobility Comments: no AD, drives, does gardening/yard work ADLs Comments: indep     Extremity/Trunk Assessment   Upper Extremity Assessment Upper Extremity Assessment: Generalized weakness    Lower Extremity Assessment Lower Extremity Assessment: Generalized weakness    Cervical / Trunk Assessment Cervical / Trunk Assessment: Normal  Communication   Communication Communication: Impaired Factors Affecting Communication: Reduced clarity of speech    Cognition Arousal: Lethargic Behavior During Therapy: Flat affect   PT - Cognitive impairments: No family/caregiver present to determine baseline                       PT - Cognition Comments: pt flat affect, delayed processing, difficulty speaking at times Following commands: Intact       Cueing Cueing Techniques: Verbal cues, Tactile cues     General Comments General comments (skin integrity, edema, etc.): assisted pt to bathroom, indep with pericare in sitting, pt with feeling of whooziness while on commode, BP 127/86 from 142/75 when sitting EOB    Exercises     Assessment/Plan    PT Assessment Patient needs continued PT services  PT Problem List Decreased strength;Decreased activity tolerance;Decreased mobility;Decreased balance;Decreased coordination;Decreased knowledge of use of DME       PT Treatment Interventions DME instruction;Gait training;Stair training;Functional mobility training;Therapeutic activities;Therapeutic exercise;Balance training;Neuromuscular re-education    PT Goals (Current goals can be found in the  Care Plan section)  Acute Rehab PT Goals Patient Stated Goal: feel better PT Goal Formulation: With patient Time For Goal Achievement: 06/03/24 Potential to Achieve Goals: Good    Frequency Min 2X/week     Co-evaluation               AM-PAC PT 6 Clicks Mobility  Outcome Measure Help needed turning from your back to your side while in a flat bed without using bedrails?: A Little Help needed moving from lying on your back to sitting on the side of a flat bed without using bedrails?: A Little Help needed moving to and from a bed to a chair (including a wheelchair)?: A Little Help needed standing up from a chair using your arms (e.g., wheelchair or bedside chair)?: A Little Help needed to walk in hospital room?: A Lot Help needed climbing 3-5 steps with a railing? : A Lot 6 Click Score: 16    End of Session Equipment Utilized During Treatment: Gait belt Activity Tolerance: Patient limited by fatigue Patient left: in bed;with call bell/phone within reach;with bed alarm set Nurse Communication: Mobility status (to use BSC due to increased falls risk) PT Visit Diagnosis: Unsteadiness on feet (R26.81);Muscle weakness (generalized) (M62.81);Difficulty in walking, not elsewhere classified (R26.2)    Time: 9094-9063 PT Time  Calculation (min) (ACUTE ONLY): 31 min   Charges:   PT Evaluation $PT Eval Moderate Complexity: 1 Mod PT Treatments $Gait Training: 8-22 mins PT General Charges $$ ACUTE PT VISIT: 1 Visit         Claire Sutton, PT, DPT Acute Rehabilitation Services Secure chat preferred Office #: [phone]   Claire Sutton 05/20/2024, 11:31 AM

## 2024-05-20 NOTE — Evaluation (Signed)
 Occupational Therapy Evaluation Patient Details Name: Claire Sutton MRN: [id number] DOB: [birth year] Today's Date: 05/20/2024   History of Present Illness   Claire Sutton is a 63 y.o. female who initially presented to [facility] ED with complaints of profound generalized weakness for about a week, associated with myalgia more on the left side of her body than right.  PMH: HTN, HLD, recently on amoxicillin  05/16/14 for group A strep infecion     Clinical Impressions Pt c/o fatigue. Pt lives at home alone, but son has been staying with her, he works as a Runner, broadcasting/film/video and is off for the summer. Pt PLOF independent, active. Pt currently presents with significant fatigue, able to ambulate very short distances to bathroom and back with RW, increased time, begins to shake if it takes too long. Pt has BSC and RW at home. Pt set up/supervision and increased time for ADLs sitting at bedside. BP sitting at end of session 156/83. Pt would benefit from continued acute OT to maximize activity tolerance and return to PLOF, planning for OPPT upon DC, no follow up OT expected at this time.      If plan is discharge home, recommend the following:   A little help with walking and/or transfers;A little help with bathing/dressing/bathroom;Assistance with cooking/housework;Assist for transportation;Help with stairs or ramp for entrance     Functional Status Assessment   Patient has had a recent decline in their functional status and demonstrates the ability to make significant improvements in function in a reasonable and predictable amount of time.     Equipment Recommendations   None recommended by OT     Recommendations for Other Services         Precautions/Restrictions   Precautions Precautions: Fall Restrictions Weight Bearing Restrictions Per Provider Order: No     Mobility Bed Mobility Overal bed mobility: Needs Assistance Bed Mobility: Supine to Sit, Sit to Supine     Supine to  sit: Supervision Sit to supine: Supervision   General bed mobility comments: increased time, used arm rails, HOB elevated    Transfers Overall transfer level: Needs assistance Equipment used: Rolling walker (2 wheels) Transfers: Sit to/from Stand, Bed to chair/wheelchair/BSC Sit to Stand: Contact guard assist           General transfer comment: CGA, increased time, poor endurance, ambulates short distances      Balance Overall balance assessment: Needs assistance Sitting-balance support: No upper extremity supported, Feet supported Sitting balance-Leahy Scale: Fair     Standing balance support: During functional activity Standing balance-Leahy Scale: Fair Standing balance comment: able to stand unsupported and wash hands but poor endurance.                           ADL either performed or assessed with clinical judgement   ADL Overall ADL's : Needs assistance/impaired                                       General ADL Comments: set up/CGA for ADLs, increased time, uses RW to get around, able to stand unsupported and wash hands at sink but has very poor endurance, safer to complete ADLs while sitting.     Vision         Perception         Praxis         Pertinent Vitals/Pain Pain Assessment  Pain Assessment: No/denies pain     Extremity/Trunk Assessment Upper Extremity Assessment Upper Extremity Assessment: Generalized weakness   Lower Extremity Assessment Lower Extremity Assessment: Defer to PT evaluation   Cervical / Trunk Assessment Cervical / Trunk Assessment: Normal   Communication Communication Communication: No apparent difficulties Factors Affecting Communication: Reduced clarity of speech   Cognition Arousal: Alert Behavior During Therapy: WFL for tasks assessed/performed Cognition: No apparent impairments                               Following commands: Intact       Cueing  General  Comments   Cueing Techniques: Verbal cues;Tactile cues  BP sitting 156/83 after short ambulation   Exercises     Shoulder Instructions      Home Living Family/patient expects to be discharged to:: Private residence Living Arrangements: Alone Available Help at Discharge: Family;Available 24 hours/day Type of Home: House Home Access: Stairs to enter [employer] of Steps: 1+1 Entrance Stairs-Rails: None Home Layout: One level     Bathroom Shower/Tub: Chief Strategy Officer: Handicapped height     Home Equipment: Agricultural consultant (2 wheels);BSC/3in1   Additional Comments: Pt lives with son who is Runner, broadcasting/film/video and off for the summer      Prior Functioning/Environment Prior Level of Function : Independent/Modified Independent;Working/employed;Driving             Mobility Comments: no AD, drives, does gardening/yard work ADLs Comments: indep    OT Problem List: Decreased strength;Decreased activity tolerance;Impaired balance (sitting and/or standing);Obesity   OT Treatment/Interventions: Self-care/ADL training;Therapeutic exercise;Energy conservation;DME and/or AE instruction;Therapeutic activities;Patient/family education;Balance training      OT Goals(Current goals can be found in the care plan section)   Acute Rehab OT Goals Patient Stated Goal: to return home, improve endurance OT Goal Formulation: With patient Time For Goal Achievement: 06/03/24 Potential to Achieve Goals: Good   OT Frequency:  Min 2X/week    Co-evaluation              AM-PAC OT 6 Clicks Daily Activity     Outcome Measure Help from another person eating meals?: None Help from another person taking care of personal grooming?: A Little Help from another person toileting, which includes using toliet, bedpan, or urinal?: A Little Help from another person bathing (including washing, rinsing, drying)?: A Little Help from another person to put on and taking off regular  upper body clothing?: A Little Help from another person to put on and taking off regular lower body clothing?: A Little 6 Click Score: 19   End of Session Equipment Utilized During Treatment: Gait belt;Rolling walker (2 wheels) Nurse Communication: Mobility status  Activity Tolerance: Patient limited by fatigue Patient left: in bed;with call bell/phone within reach  OT Visit Diagnosis: Unsteadiness on feet (R26.81);Other abnormalities of gait and mobility (R26.89);Muscle weakness (generalized) (M62.81)                Time: 8866-8842 OT Time Calculation (min): 24 min Charges:  OT General Charges $OT Visit: 1 Visit OT Evaluation $OT Eval Low Complexity: 1 Low OT Treatments $Self Care/Home Management : 8-22 mins  [address] OTR/L   Elouise JONELLE Bott 05/20/2024, 12:03 PM

## 2024-05-20 NOTE — Plan of Care (Signed)
  Problem: Education: Goal: Knowledge of General Education information will improve Description: Including pain rating scale, medication(s)/side effects and non-pharmacologic comfort measures Outcome: Progressing   Problem: Clinical Measurements: Goal: Ability to maintain clinical measurements within normal limits will improve Outcome: Progressing Goal: Will remain free from infection Outcome: Progressing Goal: Diagnostic test results will improve Outcome: Progressing Goal: Respiratory complications will improve Outcome: Progressing Goal: Cardiovascular complication will be avoided Outcome: Progressing   Problem: Activity: Goal: Risk for activity intolerance will decrease Outcome: Progressing   Problem: Nutrition: Goal: Adequate nutrition will be maintained Outcome: Progressing   Problem: Elimination: Goal: Will not experience complications related to bowel motility Outcome: Progressing Goal: Will not experience complications related to urinary retention Outcome: Progressing   Problem: Safety: Goal: Ability to remain free from injury will improve Outcome: Progressing   Problem: Pain Managment: Goal: General experience of comfort will improve and/or be controlled Outcome: Progressing   Problem: Safety: Goal: Ability to remain free from injury will improve Outcome: Progressing   Problem: Skin Integrity: Goal: Risk for impaired skin integrity will decrease Outcome: Progressing

## 2024-05-20 NOTE — Progress Notes (Addendum)
 Transition of Care [facility]) - Inpatient Brief Assessment   Patient Details  Name: Claire Sutton MRN: [id number] Date of Birth: [birth year]  Transition of Care [facility]) CM/SW Contact:    Rosaline JONELLE Joe, RN Phone Number: 05/20/2024, 9:24 AM   Clinical Narrative: Patient admitted to the hospital for Profound generalized weakness/presyncope, group A strep infection and possible viral infection - plan is for patient resume amoxicillin , orthostatic VS eval, IVF.  No IP Care management needs at this time.  CM will continue to follow the patient for needs as patient progresses.  05/20/24 1159 - I met with the patient at the bedside and she is aware that I am unable to obtain home health agency that is in network with her insurance provider.  Patient was agreeable for referral for Outpatien therpay - referral placed at [facility] location and MD asked to co-sign.  Patient states that she has a Rw and 3:1 at home and declines new Rw.  Patient states that her son is able to provide 24 hour supervision in her home.  No other IP Care management needs at this time.   Transition of Care Asessment: Insurance and Status: (P) Insurance coverage has been reviewed Patient has primary care physician: (P) Yes Home environment has been reviewed: (P) from home Prior level of function:: (P) self Prior/Current Home Services: (P) No current home services Social Drivers of Health Review: (P) SDOH reviewed no interventions necessary Readmission risk has been reviewed: (P) Yes Transition of care needs: (P) no transition of care needs at this time

## 2024-05-20 NOTE — Discharge Summary (Signed)
 Physician Discharge Summary  Claire Sutton FMW:[id number] DOB: [birth year] DOA: 05/19/2024  PCP: Katheen Roselie Rockford, NP  Admit date: 05/19/2024 Discharge date: 05/20/2024  Admitted From: Home Disposition: Home  Recommendations for Outpatient Follow-up:  Follow up with PCP in 1-2 weeks Follow-up with neurology as discussed  Home Health: PT OT Equipment/Devices: Rolling walker  Discharge Condition: Stable CODE STATUS: Full Diet recommendation: Low-salt low-fat diet  Brief/Interim Summary: Claire Sutton is a 63 y.o. female with medical history significant for hypertension, hyperlipidemia, who initially presented to [facility] ED with complaints of profound generalized weakness an presyncope without loss of consciousness in the setting of recently diagnosed strep infection on antibiotics.  At this time patient continues to feel generally weak but has normal physical exam 5 out of 5 strength.  PT OT recommending ongoing home health with PT and OT with rolling walker as well.  Discussed case with neurology given patient's symptoms they are somewhat concerning for possible myasthenia gravis however given its acute onset this is much less likely than a general decline after infection.  Would recommend outpatient follow-up with neurology for further evaluation, imaging and testing per their expertise. No testing recommended at this time by neurology.  Given negative workup patient is otherwise stable and agreeable for discharge home, continue antibiotics as previously prescribed.  Discharge Diagnoses:  Principal Problem:   Postural dizziness with presyncope    Discharge Instructions  Discharge Instructions     Ambulatory referral to Physical Therapy   Complete by: As directed    Iontophoresis - 4 mg/ml of dexamethasone: No   T.E.N.S. Unit Evaluation and Dispense as Indicated: No      Allergies as of 05/20/2024       Reactions   Codeine Other (See Comments)   Cold  sweats Lightheadedness   Flexeril  [cyclobenzaprine ] Rash        Medication List     TAKE these medications    albuterol  108 (90 Base) MCG/ACT inhaler Commonly known as: VENTOLIN  HFA Inhale 2 puffs into the lungs every 6 (six) hours as needed for wheezing or shortness of breath.   amLODipine  5 MG tablet Commonly known as: NORVASC  Take 1 tablet (5 mg total) by mouth daily.   amoxicillin  875 MG tablet Commonly known as: AMOXIL  Take 1 tablet (875 mg total) by mouth 2 (two) times daily for 10 days.   atorvastatin  20 MG tablet Commonly known as: LIPITOR TAKE 1 TABLET(20 MG) BY MOUTH DAILY   Bromfenac Sodium 0.07 % Soln Place 1 drop into the left eye daily.   cetirizine 10 MG tablet Commonly known as: ZYRTEC Take 10 mg by mouth daily.   EPINEPHrine  0.3 mg/0.3 mL Soaj injection Commonly known as: EPI-PEN Inject 0.3 mg into the muscle as needed for anaphylaxis.   losartan  50 MG tablet Commonly known as: COZAAR  Take 1 tablet (50 mg total) by mouth daily.   montelukast  10 MG tablet Commonly known as: SINGULAIR  TAKE 1 TABLET(10 MG) BY MOUTH AT BEDTIME What changed:  how much to take how to take this when to take this additional instructions   Multivitamin Gummies Womens Chew Chew 2 each by mouth daily.   omeprazole  20 MG capsule Commonly known as: PRILOSEC Take 1 capsule (20 mg total) by mouth daily.   prednisoLONE acetate 1 % ophthalmic suspension Commonly known as: PRED FORTE Place 1 drop into the left eye See admin instructions. Administer 1 drop into the LEFT eye once daily, may increase to 1 drop 4 times daily  as needed for flares,eye irritation.   VITAMIN B-12 PO Take 2 each by mouth daily. OTC vitamin B-12 gummy   VITAMIN D -3 PO Take 2 each by mouth daily. OTC vitamin D -3 gummy   Voltaren  1 % Gel Generic drug: diclofenac  Sodium Apply 1 Application topically 2 (two) times daily as needed (hip pain).               Durable Medical Equipment   (From admission, onward)           Start     Ordered   05/20/24 1151  For home use only DME Walker rolling  Once       Question Answer Comment  Walker: With 5 Inch Wheels   Patient needs a walker to treat with the following condition Generalized weakness   Patient needs a walker to treat with the following condition Postural dizziness with near syncope      05/20/24 1150            Follow-up Information     [facility] Follow up.   Specialty: Rehabilitation Why: Please follow up with the Outpatient center to schedule outpatient therapy needs. Contact information: [address] [place]  [phone]               Allergies  Allergen Reactions   Codeine Other (See Comments)    Cold sweats Lightheadedness   Flexeril  [Cyclobenzaprine ] Rash    Consultations: None, sideline discussion with neurology, no formal consult  Procedures/Studies: DG Chest Portable 1 View Result Date: 05/19/2024 CLINICAL DATA:  Presyncope EXAM: PORTABLE CHEST 1 VIEW COMPARISON:  05/16/2024 FINDINGS: The heart size and mediastinal contours are within normal limits. Both lungs are clear. The visualized skeletal structures are unremarkable. IMPRESSION: No active disease. Electronically Signed   By: Dorethia Molt M.D.   On: 05/19/2024 20:04   DG Chest 2 View Result Date: 05/18/2024 CLINICAL DATA:  Severe fatigue. EXAM: CHEST - 2 VIEW COMPARISON:  08/22/2017 FINDINGS: The heart size and mediastinal contours are within normal limits. Both lungs are clear. The visualized skeletal structures are unremarkable. IMPRESSION: No active cardiopulmonary disease. Electronically Signed   By: Norleen DELENA Kil M.D.   On: 05/18/2024 15:02   MR Brain Wo Contrast (neuro protocol) Result Date: 05/14/2024 CLINICAL DATA:  Initial evaluation for acute neuro deficit, stroke suspected. EXAM: MRI HEAD WITHOUT CONTRAST TECHNIQUE:  Multiplanar, multiecho pulse sequences of the brain and surrounding structures were obtained without intravenous contrast. COMPARISON:  CT from earlier the same day. FINDINGS: Brain: Cerebral volume within normal limits. No significant cerebral white matter disease or other focal parenchymal signal abnormality. No abnormal foci of restricted diffusion to suggest acute or subacute ischemia. Gray-white matter differentiation maintained. No areas of chronic cortical infarction or other insult. Dilated perivascular space noted at the inferior aspect of the left basal ganglia. No acute or chronic intracranial blood products. No mass lesion, midline shift or mass effect no hydrocephalus or extra-axial fluid collection. Pituitary gland within normal limits. Vascular: Major intracranial vascular flow voids are maintained. Diminutive vertebrobasilar system noted. Skull and upper cervical spine: Craniocervical junction within normal limits. Diffuse loss of normal bone marrow signal, nonspecific but can be seen with anemia, smoking, obesity, and infiltrative/myelofibrotic marrow processes. No scalp soft tissue abnormality. Sinuses/Orbits: Prior bilateral ocular lens replacement. Paranasal sinuses are largely clear. No mastoid effusion. Other: None. IMPRESSION: Normal brain MRI.  No acute intracranial abnormality. Electronically Signed   By: Morene  Nicholes M.D.   On: 05/14/2024 19:50   CT HEAD WO CONTRAST Result Date: 05/14/2024 CLINICAL DATA:  Neuro deficit, acute, stroke suspected, weakness stage EXAM: CT HEAD WITHOUT CONTRAST TECHNIQUE: Contiguous axial images were obtained from the base of the skull through the vertex without intravenous contrast. RADIATION DOSE REDUCTION: This exam was performed according to the departmental dose-optimization program which includes automated exposure control, adjustment of the mA and/or kV according to patient size and/or use of iterative reconstruction technique. COMPARISON:  None  Available. FINDINGS: Brain: Normal anatomic configuration. Dilated perivascular space within the left basal ganglia. No abnormal intra or extra-axial mass lesion or fluid collection. No abnormal mass effect or midline shift. No evidence of acute intracranial hemorrhage or infarct. Ventricular size is normal. Cerebellum unremarkable. Vascular: Unremarkable Skull: Intact Sinuses/Orbits: Paranasal sinuses are clear. Orbits are unremarkable. Other: Mastoid air cells and middle ear cavities are clear. IMPRESSION: 1. No acute intracranial abnormality. Electronically Signed   By: Dorethia Molt M.D.   On: 05/14/2024 16:03     Subjective: No acute issues or events overnight, weakness ongoing, if not minimally improved   Discharge Exam: Vitals:   05/20/24 0835 05/20/24 1302  BP: 111/66 130/83  Pulse: 61 64  Resp: 18 20  Temp: 98 F (36.7 C) 98.6 F (37 C)  SpO2: 95% 98%   Vitals:   05/20/24 0053 05/20/24 0640 05/20/24 0835 05/20/24 1302  BP: (!) 153/67 110/63 111/66 130/83  Pulse: 61 (!) 58 61 64  Resp: 18 17 18 20   Temp: 97.8 F (36.6 C) 98.4 F (36.9 C) 98 F (36.7 C) 98.6 F (37 C)  TempSrc:      SpO2: 95% 96% 95% 98%  Weight:      Height:        General:  Pleasantly resting in bed, No acute distress. HEENT:  Normocephalic atraumatic.  Sclerae nonicteric, noninjected.  Extraocular movements intact bilaterally. Neck:  Without mass or deformity.  Trachea is midline. Lungs:  Clear to auscultate bilaterally without rhonchi, wheeze, or rales. Heart:  Regular rate and rhythm.  Without murmurs, rubs, or gallops. Abdomen:  Soft, nontender, nondistended.  Without guarding or rebound. Extremities: 5 out of 5 strength in all 4 limbs   The results of significant diagnostics from this hospitalization (including imaging, microbiology, ancillary and laboratory) are listed below for reference.     Microbiology: No results found for this or any previous visit (from the past 240 hours).    Labs: BNP (last 3 results) No results for input(s): BNP in the last 8760 hours. Basic Metabolic Panel: Recent Labs  Lab 05/14/24 1355 05/14/24 2015 05/19/24 1757 05/20/24 0330  NA 142  --  139 141  K 4.0  --  3.9 3.7  CL 107  --  105 107  CO2 24  --  21* 27  GLUCOSE 86  --  99 92  BUN 12  --  14 10  CREATININE 1.00  --  1.03* 0.96  CALCIUM  9.7  --  9.9 9.2  MG  --  1.7  --  1.9  PHOS  --   --   --  3.9   Liver Function Tests: Recent Labs  Lab 05/14/24 1355 05/19/24 1757  AST 23 25  ALT 25 25  ALKPHOS 105 112  BILITOT 0.5 0.6  PROT 7.8 8.0  ALBUMIN 4.2 4.2   No results for input(s): LIPASE, AMYLASE in the last 168 hours. No results for input(s): AMMONIA in the last 168 hours.  CBC: Recent Labs  Lab 05/14/24 1355 05/14/24 1438 05/19/24 1757 05/20/24 0330  WBC 4.1 4.0 4.4 4.5  NEUTROABS  --  2.0  --   --   HGB 14.0 13.9 14.7 13.8  HCT 43.4 42.9 45.1 42.4  MCV 86.3 86.7 85.7 86.2  PLT 228 234 245 206   Cardiac Enzymes: Recent Labs  Lab 05/14/24 2015 05/19/24 1757  CKTOTAL 61 46   BNP: Invalid input(s): POCBNP CBG: Recent Labs  Lab 05/14/24 1356 05/14/24 1742  GLUCAP 74 89   D-Dimer No results for input(s): DDIMER in the last 72 hours. Hgb A1c No results for input(s): HGBA1C in the last 72 hours. Lipid Profile No results for input(s): CHOL, HDL, LDLCALC, TRIG, CHOLHDL, LDLDIRECT in the last 72 hours. Thyroid  function studies No results for input(s): TSH, T4TOTAL, T3FREE, THYROIDAB in the last 72 hours.  Invalid input(s): FREET3 Anemia work up No results for input(s): VITAMINB12, FOLATE, FERRITIN, TIBC, IRON, RETICCTPCT in the last 72 hours. Urinalysis    Component Value Date/Time   COLORURINE COLORLESS (A) 05/19/2024 1757   APPEARANCEUR CLEAR 05/19/2024 1757   LABSPEC <1.005 (L) 05/19/2024 1757   PHURINE 6.0 05/19/2024 1757   GLUCOSEU NEGATIVE 05/19/2024 1757   HGBUR NEGATIVE 05/19/2024  1757   BILIRUBINUR NEGATIVE 05/19/2024 1757   BILIRUBINUR neg 01/25/2018 1535   KETONESUR NEGATIVE 05/19/2024 1757   PROTEINUR NEGATIVE 05/19/2024 1757   UROBILINOGEN 0.2 01/25/2018 1535   UROBILINOGEN 0.2 11/13/2014 0827   NITRITE NEGATIVE 05/19/2024 1757   LEUKOCYTESUR NEGATIVE 05/19/2024 1757   Sepsis Labs Recent Labs  Lab 05/14/24 1355 05/14/24 1438 05/19/24 1757 05/20/24 0330  WBC 4.1 4.0 4.4 4.5   Microbiology No results found for this or any previous visit (from the past 240 hours).   Time coordinating discharge: Over 30 minutes  SIGNED:   Elsie JAYSON Montclair, DO [facility] 05/20/2024, 3:17 PM Pager   If 7PM-7AM, please contact night-coverage [email or web address]

## 2024-05-21 ENCOUNTER — Telehealth: Payer: Self-pay

## 2024-05-21 LAB — CYTOMEGALOVIRUS DNA, QUANTITATIVE REAL-TIME PCR, PLASMA
CMV DNA Quant: NEGATIVE [IU]/mL
Log10 CMV Qn DNA Pl: UNDETERMINED {Log_IU}/mL

## 2024-05-21 NOTE — Transitions of Care (Post Inpatient/ED Visit) (Signed)
 05/21/2024  Name: Claire Sutton MRN: [id number] DOB: Jun 25, 1961  Today's TOC FU Call Status: Today's TOC FU Call Status:: Successful TOC FU Call Completed TOC FU Call Complete Date: 05/21/24 Patient's Name and Date of Birth confirmed.  Transition Care Management Follow-up Telephone Call Date of Discharge: 05/20/24 Discharge Facility: [facility]) Type of Discharge: Inpatient Admission Primary Inpatient Discharge Diagnosis:: syncope How have you been since you were released from the hospital?: Better Any questions or concerns?: No  Items Reviewed: Did you receive and understand the discharge instructions provided?: Yes Medications obtained,verified, and reconciled?: Yes (Medications Reviewed) Any new allergies since your discharge?: No Dietary orders reviewed?: Yes Do you have support at home?: Yes People in Home [RPT]: child(ren), adult  Medications Reviewed Today: Medications Reviewed Today     Reviewed by Emmitt Pan, LPN (Licensed Practical Nurse) on 05/21/24 at 1212  Med List Status: <None>   Medication Order Taking? Sig Documenting Provider Last Dose Status Informant  albuterol  (VENTOLIN  HFA) 108 (90 Base) MCG/ACT inhaler [id number] Yes Inhale 2 puffs into the lungs every 6 (six) hours as needed for wheezing or shortness of breath. Katheen Roselie Rockford, NP  Active Self, Pharmacy Records  amLODipine  (NORVASC ) 5 MG tablet [id number] Yes Take 1 tablet (5 mg total) by mouth daily. Katheen Roselie Rockford, NP  Active Self, Pharmacy Records  amoxicillin  (AMOXIL ) 875 MG tablet [id number] Yes Take 1 tablet (875 mg total) by mouth 2 (two) times daily for 10 days. Nche, Roselie Rockford, NP  Active Self, Pharmacy Records           Med Note (COFFELL, ANGELA M   Tue May 20, 2024 12:27 PM) Pt has completed day 4 of 10.  atorvastatin  (LIPITOR) 20 MG tablet [id number] Yes TAKE 1 TABLET(20 MG) BY MOUTH DAILY Nche, Roselie Rockford, NP  Active Self, Pharmacy Records  Bromfenac Sodium 0.07 %  SOLN [id number] Yes Place 1 drop into the left eye daily. [provider]  Active Self, Pharmacy Records           Med Note (COFFELL, JON HERO   Tue May 20, 2024 12:29 PM) Pt states she completed her course as of 05/19/24 - she is unsure if her provider is planning to continue the medication at this time.  cetirizine (ZYRTEC) 10 MG tablet [id number] Yes Take 10 mg by mouth daily. [provider]  Active Self, Pharmacy Records  Cholecalciferol  (VITAMIN D -3 PO) [id number] Yes Take 2 each by mouth daily. OTC vitamin D -3 gummy [provider]  Active Self, Pharmacy Records  Cyanocobalamin (VITAMIN B-12 PO) [id number] Yes Take 2 each by mouth daily. OTC vitamin B-12 gummy [provider]  Active Self, Pharmacy Records  diclofenac  Sodium (VOLTAREN ) 1 % GEL [id number] Yes Apply 1 Application topically 2 (two) times daily as needed (hip pain). [provider]  Active Self, Pharmacy Records  EPINEPHrine  0.3 mg/0.3 mL IJ SOAJ injection [id number] Yes Inject 0.3 mg into the muscle as needed for anaphylaxis. Nche, Roselie Rockford, NP  Active Self, Pharmacy Records           Med Note (COFFELL, JON HERO   Tue May 20, 2024 12:25 PM) Has available, never used - possibly expired.  losartan  (COZAAR ) 50 MG tablet [id number] Yes Take 1 tablet (50 mg total) by mouth daily. Katheen Roselie Rockford, NP  Active Self, Pharmacy Records  montelukast  (SINGULAIR ) 10 MG tablet [id number] Yes TAKE 1 TABLET(10 MG) BY MOUTH AT BEDTIME  Patient taking differently: Take 10 mg by  mouth daily.   Nche, Roselie Rockford, NP  Active Self, Pharmacy Records  Multiple Vitamins-Minerals (MULTIVITAMIN GUMMIES WOMENS) CICERO [id number] Yes Chew 2 each by mouth daily. [provider]  Active Self, Pharmacy Records  omeprazole  (PRILOSEC) 20 MG capsule [id number] Yes Take 1 capsule (20 mg total) by mouth daily. Nche, Roselie Rockford, NP  Active Self, Pharmacy Records  prednisoLONE acetate (PRED FORTE) 1 % ophthalmic  suspension [id number] Yes Place 1 drop into the left eye See admin instructions. Administer 1 drop into the LEFT eye once daily, may increase to 1 drop 4 times daily as needed for flares,eye irritation. [provider]  Active Self, Pharmacy Records           Med Note (COFFELL, JON HERO   Tue May 20, 2024 12:29 PM) Pt states she completed her course as of 05/19/24 - she is unsure if her provider is planning to continue the medication at this time.            Home Care and Equipment/Supplies: Were Home Health Services Ordered?: NA Any new equipment or medical supplies ordered?: NA  Functional Questionnaire: Do you need assistance with bathing/showering or dressing?: No Do you need assistance with meal preparation?: No Do you need assistance with eating?: No Do you have difficulty maintaining continence: No Do you need assistance with getting out of bed/getting out of a chair/moving?: No Do you have difficulty managing or taking your medications?: No  Follow up appointments reviewed: PCP Follow-up appointment confirmed?: No (sent message to staff to schedule) MD Provider Line Number:[phone] Given: No Specialist Hospital Follow-up appointment confirmed?: NA Do you need transportation to your follow-up appointment?: No Do you understand care options if your condition(s) worsen?: Yes-patient verbalized understanding    SIGNATURE Julian Lemmings, LPN [facility] Nurse Health Advisor Direct Dial [phone]

## 2024-05-23 ENCOUNTER — Encounter: Payer: Self-pay | Admitting: Emergency Medicine

## 2024-05-23 ENCOUNTER — Ambulatory Visit: Payer: Self-pay

## 2024-05-23 ENCOUNTER — Ambulatory Visit
Admission: EM | Admit: 2024-05-23 | Discharge: 2024-05-23 | Disposition: A | Discharge status: Home / Self Care | Payer: PRIVATE HEALTH INSURANCE | Attending: Nurse Practitioner | Admitting: Nurse Practitioner

## 2024-05-23 DIAGNOSIS — M2550 Pain in unspecified joint: Secondary | ICD-10-CM | POA: Insufficient documentation

## 2024-05-23 DIAGNOSIS — N898 Other specified noninflammatory disorders of vagina: Secondary | ICD-10-CM | POA: Insufficient documentation

## 2024-05-23 DIAGNOSIS — Z8709 Personal history of other diseases of the respiratory system: Secondary | ICD-10-CM | POA: Insufficient documentation

## 2024-05-23 DIAGNOSIS — R5383 Other fatigue: Secondary | ICD-10-CM | POA: Diagnosis present

## 2024-05-23 DIAGNOSIS — R531 Weakness: Secondary | ICD-10-CM | POA: Insufficient documentation

## 2024-05-23 LAB — POC COVID19/FLU A&B COMBO
Covid Antigen, POC: NEGATIVE
Influenza A Antigen, POC: NEGATIVE
Influenza B Antigen, POC: NEGATIVE

## 2024-05-23 LAB — POCT URINALYSIS DIP (MANUAL ENTRY)
Bilirubin, UA: NEGATIVE
Blood, UA: NEGATIVE
Glucose, UA: NEGATIVE mg/dL
Ketones, POC UA: NEGATIVE mg/dL
Leukocytes, UA: NEGATIVE
Nitrite, UA: NEGATIVE
Protein Ur, POC: NEGATIVE mg/dL
Spec Grav, UA: 1.01 (ref 1.010–1.025)
Urobilinogen, UA: 0.2 U/dL
pH, UA: 6 (ref 5.0–8.0)

## 2024-05-23 LAB — POCT RAPID STREP A (OFFICE): Rapid Strep A Screen: NEGATIVE

## 2024-05-23 MED ORDER — FLUCONAZOLE 150 MG PO TABS: 150.0000 mg | ORAL_TABLET | ORAL | 0 refills | Status: AC

## 2024-05-23 NOTE — Discharge Instructions (Addendum)
 You are being discharged today with symptoms including persistent fatigue, weakness, joint discomfort, and recent vaginal discharge following antibiotic use. Although you report some improvement in your energy levels, you continue to feel significantly tired. Your recent hospital workup was extensive but did not reveal a clear cause for your symptoms.   Today's evaluation included testing for flu, COVID-19, and strep, which were all negative; a throat culture is pending. Your urinalysis was normal. Blood tests were ordered to evaluate for possible autoimmune or inflammatory conditions. The previously prescribed antibiotic has been discontinued, as it is no longer needed.  For joint discomfort, you may take over-the-counter ibuprofen  as directed to help reduce inflammation and improve comfort. Be sure to rest, stay well hydrated, and listen to your body as you recover.   For your vaginal symptoms, a yeast infection is suspected based on recent antibiotic use and your description of discharge. You have been prescribed antifungal medication, and further treatment will be based on the results of the vaginal cytology.  Please follow up with your primary care provider as scheduled to review test results and continue monitoring your condition.   Return to the emergency department if you experience any worsening of fatigue or weakness, new symptoms such as chest pain, difficulty breathing, confusion, persistent vomiting, or if you feel suddenly unwell.

## 2024-05-23 NOTE — ED Triage Notes (Addendum)
 Pt c/o joint pain, body aches, cough,congestion that began last night.  Took ibuprofen  around 10 am   She was dx with step 7/11 at PCP. She also was admitted to hospital 7/14 for left side weakness

## 2024-05-23 NOTE — ED Provider Notes (Signed)
 [facility]    CSN: [id number] Arrival date & time: 05/23/24  1511      History   Chief Complaint Chief Complaint  Patient presents with   Joint Pain    HPI Claire Sutton is a 63 y.o. female.   Discussed the use of AI scribe software for clinical note transcription with the patient, who gave verbal consent to proceed.   The patient presents to urgent care with a complex history of recent hospitalizations and ongoing symptoms. She reports new onset of joint pain, a brief episode of coughing, and a scratchy throat that began last night, along with persistent fatigue and generalized weakness. The joint pain was primarily in the hips, described as achy, and improved with ibuprofen . The cough lasted approximately 2-3 hours and resolved after sleep, though the scratchy throat has persisted into the morning but now resolved as well. She also notes a possible yeast infection, reporting vaginal discharge for the past few days but denies associated symptoms such as vaginal pain, itching, irritation, dysuria, or discomfort. She denies fever, headache, dizziness, nausea, vomiting, diarrhea or abdominal pain.   The patient has a notable recent medical history. She states she was fine and then suddenly not fine, prompting an ED visit on 05/14/2024 due to acute difficulty walking, imbalance, and weakness. Imaging and cardiac workup were unremarkable, and symptoms were deemed not consistent with stroke. Neurology accepted her for ED-to-ED transfer to facilitate MRI, which returned normal. She was discharged and followed up with her primary care provider on 7/11, where extensive labs were drawn to determine the etiology of her fatigue and other symptoms. Incidentally, she tested positive for strep pharyngitis despite denying sore throat at the time, and was started on amoxicillin . Despite this, her fatigue persisted and she returned to the ED on 7/14 and admitted due to profound weakness and  presyncope without loss of consciousness. She was discharged the following day with recommendations for home health services and outpatient neurology follow-up, as advised by PT/OT.  Today, she reports some improvement in strength and mobility compared to earlier in the month but continues to struggle with fatigue and generalized weakness. She denies chest pain, dyspnea, palpitations, leg swelling, or tinnitus.  The following portions of the patient's history were reviewed and updated as appropriate: allergies, current medications, past family history, past medical history, past social history, past surgical history, and problem list.     Past Medical History:  Diagnosis Date   Allergy     ASCUS (atypical squamous cells of undetermined significance) on Pap smear 07/2006   NEG HIGH RISK HPV-- THEN IN 10/2006 AND 07/2007 PAPS WITH BENIGN REACTIVE CHANGES   Asthma    Cataract 2024   bilateral   GERD (gastroesophageal reflux disease)    Headache    History of blood clot in brain    a. Fall age 68 - was told she had a blood clot in the brain from this.   Hyperlipidemia    Hypertension    Iritis of both eyes    LGSIL (low grade squamous intraepithelial dysplasia) 08/2004   PAP ON 02/2005, 7,8,9,10,11,12WNL   Obesity    Seizures (HCC)    a. began after she had a fall at age 53, last seizure age 89.    Patient Active Problem List   Diagnosis Date Noted   Postural dizziness with presyncope 05/19/2024   Prediabetes 07/03/2023   Seasonal allergic rhinitis 12/29/2021   Neural foraminal stenosis of lumbar spine 09/21/2021  DDD (degenerative disc disease), lumbar 09/21/2021   Numbness of left foot 09/21/2021   Chronic bilateral low back pain with bilateral sciatica 08/31/2021   Postmenopausal estrogen deficiency 01/22/2019   History of colon polyps 06/13/2018   Hip pain, chronic 03/07/2018   Vitamin D  deficiency 04/18/2017   Mild intermittent asthma without complication 03/14/2017    Abnormal EKG 10/18/2016   Dyslipidemia 10/18/2016   Essential hypertension 09/15/2016   Obesity    Gastroesophageal reflux disease without esophagitis 09/07/2016   Scleritis and episcleritis of left eye 06/16/2014    Past Surgical History:  Procedure Laterality Date   CATARACT EXTRACTION, BILATERAL Bilateral 11/23/2022   COLPOSCOPY     ENDOMETRIAL ABLATION  02/2007   NOVASURE/MARSUPILIZATION OF BARTHOLIN   EYE SURGERY  11/09/2022 and 11/23/2022   Cataract surgery   MARSUPILIZATION OF BARTHOLIN  02/2007   AT TIME OF NOVASURE ABLATION SURGERY   MOUTH SURGERY     RETINAL LASER PROCEDURE Left 12/12/2023   TUBAL LIGATION      OB History     Gravida  3   Para  2   Term      Preterm      AB  1   Living  2      SAB      IAB  1   Ectopic      Multiple      Live Births               Home Medications    Prior to Admission medications   Medication Sig Start Date End Date Taking? Authorizing Provider  fluconazole  (DIFLUCAN ) 150 MG tablet Take 1 tablet (150 mg total) by mouth every 3 (three) days for 2 doses. 05/23/24 05/27/24 Yes Iola Lukes, FNP  albuterol  (VENTOLIN  HFA) 108 (90 Base) MCG/ACT inhaler Inhale 2 puffs into the lungs every 6 (six) hours as needed for wheezing or shortness of breath. 01/09/24   Nche, Roselie Rockford, NP  amLODipine  (NORVASC ) 5 MG tablet Take 1 tablet (5 mg total) by mouth daily. 01/09/24   Nche, Roselie Rockford, NP  atorvastatin  (LIPITOR) 20 MG tablet TAKE 1 TABLET(20 MG) BY MOUTH DAILY 01/09/24   Nche, Roselie Rockford, NP  Bromfenac Sodium 0.07 % SOLN Place 1 drop into the left eye daily.    [provider]  cetirizine (ZYRTEC) 10 MG tablet Take 10 mg by mouth daily. 12/03/15   [provider]  Cholecalciferol  (VITAMIN D -3 PO) Take 2 each by mouth daily. OTC vitamin D -3 gummy    [provider]  Cyanocobalamin (VITAMIN B-12 PO) Take 2 each by mouth daily. OTC vitamin B-12 gummy    [provider]   diclofenac  Sodium (VOLTAREN ) 1 % GEL Apply 1 Application topically 2 (two) times daily as needed (hip pain).    [provider]  EPINEPHrine  0.3 mg/0.3 mL IJ SOAJ injection Inject 0.3 mg into the muscle as needed for anaphylaxis. 05/27/21   Nche, Roselie Rockford, NP  losartan  (COZAAR ) 50 MG tablet Take 1 tablet (50 mg total) by mouth daily. 01/09/24   Nche, Roselie Rockford, NP  montelukast  (SINGULAIR ) 10 MG tablet TAKE 1 TABLET(10 MG) BY MOUTH AT BEDTIME Patient taking differently: Take 10 mg by mouth daily. 01/09/24   Nche, Roselie Rockford, NP  Multiple Vitamins-Minerals (MULTIVITAMIN GUMMIES WOMENS) CHEW Chew 2 each by mouth daily.    [provider]  omeprazole  (PRILOSEC) 20 MG capsule Take 1 capsule (20 mg total) by mouth daily. 01/09/24   Katheen Roselie  Lum, NP  prednisoLONE acetate (PRED FORTE) 1 % ophthalmic suspension Place 1 drop into the left eye See admin instructions. Administer 1 drop into the LEFT eye once daily, may increase to 1 drop 4 times daily as needed for flares,eye irritation. 12/04/23   [provider]    Family History Family History  Problem Relation Age of Onset   Hypertension Mother    Hyperlipidemia Mother    Heart disease Mother        was told she may have had a few MIs that she was unaware of   Diabetes Father    Arthritis Sister        lumbar DDD with spinal stenosis   Hypertension Sister    Diabetes Paternal Grandfather    Colon cancer Neg Hx    Colon polyps Neg Hx    Esophageal cancer Neg Hx    Stomach cancer Neg Hx    Rectal cancer Neg Hx    Breast cancer Neg Hx     Social History Social History   Tobacco Use   Smoking status: Never    Passive exposure: Past   Smokeless tobacco: Never  Vaping Use   Vaping status: Never Used  Substance Use Topics   Alcohol use: No   Drug use: No     Allergies   Codeine and Flexeril  [cyclobenzaprine ]   Review of Systems Review of Systems  Constitutional:  Positive for appetite change  (decreased) and fatigue (improved but still present). Negative for fever.  HENT:  Negative for sore throat (scratchy last night; no issues today) and tinnitus.   Respiratory:  Positive for cough (onset last night but none upon waking up this morning). Negative for shortness of breath.   Cardiovascular:  Positive for palpitations (a few times but none lately). Negative for chest pain and leg swelling.  Gastrointestinal:  Negative for abdominal pain, diarrhea, nausea and vomiting.  Genitourinary:  Positive for vaginal discharge. Negative for dysuria.       No vaginal irritation or itching   Musculoskeletal:  Positive for myalgias. Negative for joint swelling.       Muscle and joint pain without swelling    Neurological:  Positive for weakness (generalized). Negative for dizziness and headaches.  Psychiatric/Behavioral:  Negative for confusion.   All other systems reviewed and are negative.    Physical Exam Triage Vital Signs ED Triage Vitals  Encounter Vitals Group     BP 05/23/24 1524 125/72     Girls Systolic BP Percentile --      Girls Diastolic BP Percentile --      Boys Systolic BP Percentile --      Boys Diastolic BP Percentile --      Pulse Rate 05/23/24 1524 71     Resp 05/23/24 1524 16     Temp 05/23/24 1524 97.8 F (36.6 C)     Temp Source 05/23/24 1524 Oral     SpO2 05/23/24 1524 96 %     Weight --      Height --      Head Circumference --      Peak Flow --      Pain Score 05/23/24 1525 4     Pain Loc --      Pain Education --      Exclude from Growth Chart --    No data found.  Updated Vital Signs BP 125/72 (BP Location: Right Arm)   Pulse 71   Temp 97.8 F (36.6 C) (  Oral)   Resp 16   SpO2 96%   Visual Acuity Right Eye Distance:   Left Eye Distance:   Bilateral Distance:    Right Eye Near:   Left Eye Near:    Bilateral Near:     Physical Exam Constitutional:      General: She is awake. She is not in acute distress.    Appearance: Normal  appearance. She is well-developed. She is not ill-appearing, toxic-appearing or diaphoretic.     Comments: Patient appears fatigued and generally unwell but is in no acute distress.   HENT:     Head: Normocephalic.     Right Ear: Hearing normal.     Left Ear: Hearing normal.     Mouth/Throat:     Mouth: Mucous membranes are moist.  Eyes:     General: Vision grossly intact. Gaze aligned appropriately. No visual field deficit.    Extraocular Movements: Extraocular movements intact.     Conjunctiva/sclera: Conjunctivae normal.     Pupils: Pupils are equal, round, and reactive to light.  Neck:     Trachea: Phonation normal.  Cardiovascular:     Rate and Rhythm: Normal rate and regular rhythm.     Heart sounds: Normal heart sounds.  Pulmonary:     Effort: Pulmonary effort is normal.     Breath sounds: Normal breath sounds and air entry.  Abdominal:     Palpations: Abdomen is soft.  Genitourinary:    Comments: Deferred; patient performed self-swab for Aptima testing   Musculoskeletal:        General: Normal range of motion.     Cervical back: Full passive range of motion without pain, normal range of motion and neck supple.     Right lower leg: No edema.     Left lower leg: No edema.  Lymphadenopathy:     Cervical: No cervical adenopathy.  Skin:    General: Skin is warm and dry.     Findings: No bruising or rash.  Neurological:     Mental Status: She is alert and oriented to person, place, and time.     Cranial Nerves: No cranial nerve deficit.     Sensory: Sensation is intact. No sensory deficit.     Motor: Weakness (generalized) present.     Coordination: Coordination is intact.     Gait: Gait is intact.  Psychiatric:        Mood and Affect: Mood normal.        Behavior: Behavior normal. Behavior is cooperative.        Thought Content: Thought content normal.        Judgment: Judgment normal.      UC Treatments / Results  Labs (all labs ordered are listed, but only  abnormal results are displayed) Labs Reviewed  CULTURE, GROUP A STREP ([facility])  ANTINUCLEAR ANTIBODIES, IFA  RHEUMATOID FACTOR  URIC ACID  RPR  POC COVID19/FLU A&B COMBO  POCT URINALYSIS DIP (MANUAL ENTRY)  POCT RAPID STREP A (OFFICE)  CERVICOVAGINAL ANCILLARY ONLY    EKG   Radiology No results found.  Procedures Procedures (including critical care time)  Medications Ordered in UC Medications - No data to display  Initial Impression / Assessment and Plan / UC Course  I have reviewed the triage vital signs and the nursing notes.  Pertinent labs & imaging results that were available during my care of the patient were reviewed by me and considered in my medical decision making (see chart for details).  The patient presents with persistent fatigue, new onset joint pain, and a scratchy throat, along with recent vaginal discharge. She reports some improvement in energy compared to earlier this month but continues to experience significant tiredness. There is no fever on exam today. Her recent hospital evaluations, including imaging, cardiac workup, neurology consult, and laboratory studies, were extensive but did not yield a definitive diagnosis. She was previously treated with amoxicillin  for incidentally identified strep pharyngitis despite minimal symptoms, but fatigue and weakness persisted. Given the recurrence of symptoms and lack of clear etiology, further outpatient evaluation is warranted to assess for potential autoimmune or inflammatory processes.  Flu and COVID-19 tests are negative. Rapid strep test is negative today, with throat culture pending. Urinalysis is unremarkable. Additional labs including ANA, rheumatoid factor, RPR, and uric acid were ordered to investigate underlying causes of fatigue and joint discomfort. The prior antibiotic regimen has been discontinued as it is no longer indicated. Over-the-counter ibuprofen  is recommended for symptomatic management of joint  pain. The patient is advised to rest and maintain adequate hydration while further evaluation is underway.  Regarding the reported vaginal discharge, symptoms developed following antibiotic use and are consistent with a likely yeast infection. The patient denies pain, itching, or dysuria. Vaginal cytology was performed, and empiric antifungal therapy was prescribed. Further treatment will be guided by cytology results.  The patient is advised to follow up with her primary care provider as scheduled for continuity of care and further evaluation. She should return to the emergency department if symptoms worsen or if new concerning symptoms develop.  Today's evaluation has revealed no signs of a dangerous process. Discussed diagnosis with patient and/or guardian. Patient and/or guardian aware of their diagnosis, possible red flag symptoms to watch out for and need for close follow up. Patient and/or guardian understands verbal and written discharge instructions. Patient and/or guardian comfortable with plan and disposition.  Patient and/or guardian has a clear mental status at this time, good insight into illness (after discussion and teaching) and has clear judgment to make decisions regarding their care  Documentation was completed with the aid of voice recognition software. Transcription may contain typographical errors. Final Clinical Impressions(s) / UC Diagnoses   Final diagnoses:  Polyarthralgia  Fatigue, unspecified type  Generalized weakness  Vaginal discharge  History of strep pharyngitis     Discharge Instructions      You are being discharged today with symptoms including persistent fatigue, weakness, joint discomfort, and recent vaginal discharge following antibiotic use. Although you report some improvement in your energy levels, you continue to feel significantly tired. Your recent hospital workup was extensive but did not reveal a clear cause for your symptoms.   Today's  evaluation included testing for flu, COVID-19, and strep, which were all negative; a throat culture is pending. Your urinalysis was normal. Blood tests were ordered to evaluate for possible autoimmune or inflammatory conditions. The previously prescribed antibiotic has been discontinued, as it is no longer needed.  For joint discomfort, you may take over-the-counter ibuprofen  as directed to help reduce inflammation and improve comfort. Be sure to rest, stay well hydrated, and listen to your body as you recover.   For your vaginal symptoms, a yeast infection is suspected based on recent antibiotic use and your description of discharge. You have been prescribed antifungal medication, and further treatment will be based on the results of the vaginal cytology.  Please follow up with your primary care provider as scheduled to review test results and continue monitoring  your condition.   Return to the emergency department if you experience any worsening of fatigue or weakness, new symptoms such as chest pain, difficulty breathing, confusion, persistent vomiting, or if you feel suddenly unwell.      ED Prescriptions     Medication Sig Dispense Auth. Provider   fluconazole  (DIFLUCAN ) 150 MG tablet Take 1 tablet (150 mg total) by mouth every 3 (three) days for 2 doses. 2 tablet Iola Lukes, FNP      PDMP not reviewed this encounter.   Iola Lukes, [place] 05/23/24 [phone]

## 2024-05-23 NOTE — Telephone Encounter (Signed)
 FYI Only or Action Required?: FYI only for provider.  Patient was last seen in primary care on 05/16/2024 by Claire Sutton, Claire Rockford, NP.  Called Nurse Triage reporting Joint Pain and Sore Throat.  Symptoms began yesterday.  Interventions attempted: Prescription medications: AMOXIL .  Symptoms are: gradually worsening.  Triage Disposition: See Physician Within 24 Hours  Patient/caregiver understands and will follow disposition?: Yes     Copied from CRM [id number]. Topic: Clinical - Red Word Triage >> May 23, 2024  9:12 AM Robinson H wrote: [facility] that prompted transfer to Nurse Triage: Joint pain all over, scratchy throat wants to know if it's associated with strep, has strep but no symptoms until last night. Reason for Disposition  [1] Taking antibiotic > 72 hours (3 days) for strep throat AND [2] sore throat not improved  Answer Assessment - Initial Assessment Questions 1. ONSET: When did the muscle aches or body pains start?      Last night Of note, pt on abx since last Friday for strep + and was asymptomatic until last night - on day 8/10 abx 2. LOCATION: What part of your body is hurting? (e.g., entire body, arms, legs)      Joint pain all over - from arms, hips 3. SEVERITY: How bad is the pain? (Scale 1-10; or mild, moderate, severe)     3/10 4. CAUSE: What do you think is causing the pains?     unknown 5. FEVER: Do you have a fever? If Yes, ask: What is your temperature, how was it measured, and  when did it start?      denies 6. OTHER SYMPTOMS: Do you have any other symptoms? (e.g., chest pain, cold or flu symptoms, rash, weakness, weight loss)     Endorses some rashes that have been coming and going and unknown if related to current sx - on back and bottom and side - dry area... red and dries up and goes away Weight loss d/t lack of appetite for the past few weeks Endorses a little scratchy throat and some coughing last night 7. PREGNANCY: Is there any  chance you are pregnant? When was your last menstrual period?     N/a 8. TRAVEL: Have you traveled out of the country in the last month? (e.g., exposures, travel history)     denies  Answer Assessment - Initial Assessment Questions 1. SYMPTOM: What's the main symptom you're concerned about? (e.g., fever, difficulty swallowing, sore throat)     Joint pain, scratchy throat 2. ANTIBIOTIC: What antibiotic are you taking? How many times a day?     amoxicillin  (AMOXIL ) 875 MG tablet [[id number]] 3. ONSET: When was the antibiotic started?     Day 8/10 4. THROAT PAIN:  How bad is the sore throat? (Scale 1-10; mild, moderate or severe)     mild 5. FEVER: Do you have a fever? If Yes, ask: What is your temperature, how was it measured, and when did it start?     denies 6. OTHER SYMPTOMS: Do you have any other symptoms? (e.g., rash)     See alternate answer assessment 7. BETTER-SAME-WORSE: Are you getting better, staying the same, or getting worse compared to the day you started the antibiotics?     worse 8. PREGNANCY: Is there any chance you are pregnant? When was your last menstrual period?     N/a    [facility] attempted to schedule with PCP, but no access. Scheduled with [facility].  Protocols used: Muscle Aches and Body Pain-A-AH,  Strep Throat Infection on Antibiotic Follow-up Call-A-AH

## 2024-05-26 ENCOUNTER — Ambulatory Visit (HOSPITAL_COMMUNITY): Payer: Self-pay

## 2024-05-26 LAB — CERVICOVAGINAL ANCILLARY ONLY
Bacterial Vaginitis (gardnerella): NEGATIVE
Candida Glabrata: NEGATIVE
Candida Vaginitis: NEGATIVE
Chlamydia: NEGATIVE
Comment: NEGATIVE
Comment: NEGATIVE
Comment: NEGATIVE
Comment: NEGATIVE
Comment: NEGATIVE
Comment: NORMAL
Neisseria Gonorrhea: NEGATIVE
Trichomonas: NEGATIVE

## 2024-05-26 LAB — RPR: RPR Ser Ql: NONREACTIVE

## 2024-05-26 LAB — ANTINUCLEAR ANTIBODIES, IFA: ANA Titer 1: NEGATIVE

## 2024-05-26 LAB — URIC ACID: Uric Acid: 4.8 mg/dL (ref 3.0–7.2)

## 2024-05-26 LAB — CULTURE, GROUP A STREP (THRC)

## 2024-05-26 LAB — RHEUMATOID FACTOR: Rheumatoid fact SerPl-aCnc: 14 [IU]/mL — ABNORMAL HIGH (ref <14.0)

## 2024-05-27 ENCOUNTER — Ambulatory Visit: Payer: PRIVATE HEALTH INSURANCE | Admitting: Nurse Practitioner

## 2024-05-27 ENCOUNTER — Encounter: Payer: Self-pay | Admitting: Nurse Practitioner

## 2024-05-27 VITALS — BP 134/76 | HR 64 | Temp 98.2°F | Ht 65.5 in | Wt 233.0 lb

## 2024-05-27 DIAGNOSIS — R5383 Other fatigue: Secondary | ICD-10-CM | POA: Diagnosis not present

## 2024-05-27 DIAGNOSIS — M6281 Muscle weakness (generalized): Secondary | ICD-10-CM

## 2024-05-27 LAB — B12 AND FOLATE PANEL
Folate: >23.4 ng/mL (ref >5.9)
Vitamin B-12: >1500 pg/mL — ABNORMAL HIGH (ref 211–911)

## 2024-05-27 NOTE — Progress Notes (Signed)
 Established Patient Visit  Patient: Claire Sutton   DOB: 1961/08/29   63 y.o. Female  MRN: [id number] Visit Date: 05/27/2024  Subjective:    Chief Complaint  Patient presents with   Follow-up    Recent hospitalization    HPI Accompanied by son Fatigue Onset 05/14/2024, led to 2 ED visits and 1urgent care visit. Positive Strep: completed treatment with amoxicillin  Negative CK, ANA, ESR, CRP, CBC, CMP, THYROID , CMV, COVID, and mono Today she reports improving fatigue and muscle weakness. Has intermittent head fullness and light headedness with movement Normal neuro exam today She has upcoming appointment with neurology to eval for any neuromuscular disorder. 06/09/2024. Check B12 and folate  Reviewed medical, surgical, and social history today  Medications: Outpatient Medications Prior to Visit  Medication Sig   albuterol  (VENTOLIN  HFA) 108 (90 Base) MCG/ACT inhaler Inhale 2 puffs into the lungs every 6 (six) hours as needed for wheezing or shortness of breath.   amLODipine  (NORVASC ) 5 MG tablet Take 1 tablet (5 mg total) by mouth daily.   atorvastatin  (LIPITOR) 20 MG tablet TAKE 1 TABLET(20 MG) BY MOUTH DAILY   Bromfenac Sodium 0.07 % SOLN Place 1 drop into the left eye daily.   cetirizine (ZYRTEC) 10 MG tablet Take 10 mg by mouth daily.   Cholecalciferol  (VITAMIN D -3 PO) Take 2 each by mouth daily. OTC vitamin D -3 gummy   Cyanocobalamin (VITAMIN B-12 PO) Take 2 each by mouth daily. OTC vitamin B-12 gummy   diclofenac  Sodium (VOLTAREN ) 1 % GEL Apply 1 Application topically 2 (two) times daily as needed (hip pain).   EPINEPHrine  0.3 mg/0.3 mL IJ SOAJ injection Inject 0.3 mg into the muscle as needed for anaphylaxis.   fluconazole  (DIFLUCAN ) 150 MG tablet Take 1 tablet (150 mg total) by mouth every 3 (three) days for 2 doses.   losartan  (COZAAR ) 50 MG tablet Take 1 tablet (50 mg total) by mouth daily.   montelukast  (SINGULAIR ) 10 MG tablet TAKE 1 TABLET(10 MG) BY  MOUTH AT BEDTIME (Patient taking differently: Take 10 mg by mouth daily.)   Multiple Vitamins-Minerals (MULTIVITAMIN GUMMIES WOMENS) CHEW Chew 2 each by mouth daily.   omeprazole  (PRILOSEC) 20 MG capsule Take 1 capsule (20 mg total) by mouth daily.   prednisoLONE acetate (PRED FORTE) 1 % ophthalmic suspension Place 1 drop into the left eye See admin instructions. Administer 1 drop into the LEFT eye once daily, may increase to 1 drop 4 times daily as needed for flares,eye irritation.   No facility-administered medications prior to visit.   Reviewed past medical and social history.   ROS per HPI above  Last CBC Lab Results  Component Value Date   WBC 4.5 05/20/2024   HGB 13.8 05/20/2024   HCT 42.4 05/20/2024   MCV 86.2 05/20/2024   MCH 28.0 05/20/2024   RDW 13.7 05/20/2024   PLT 206 05/20/2024   Last metabolic panel Lab Results  Component Value Date   GLUCOSE 92 05/20/2024   NA 141 05/20/2024   K 3.7 05/20/2024   CL 107 05/20/2024   CO2 27 05/20/2024   BUN 10 05/20/2024   CREATININE 0.96 05/20/2024   GFRNONAA >60 05/20/2024   CALCIUM  9.2 05/20/2024   PHOS 3.9 05/20/2024   PROT 8.0 05/19/2024   ALBUMIN 4.2 05/19/2024   LABGLOB 2.9 03/29/2022   AGRATIO 1.4 03/29/2022   BILITOT 0.6 05/19/2024   ALKPHOS 112 05/19/2024  AST 25 05/19/2024   ALT 25 05/19/2024   ANIONGAP 7 05/20/2024   Last lipids Lab Results  Component Value Date   CHOL 170 01/09/2024   HDL 50.60 01/09/2024   LDLCALC 105 (H) 01/09/2024   LDLDIRECT 104.0 10/17/2021   TRIG 70.0 01/09/2024   CHOLHDL 3 01/09/2024   Last hemoglobin A1c Lab Results  Component Value Date   HGBA1C 6.0 01/09/2024   Last thyroid  functions Lab Results  Component Value Date   TSH 1.36 05/16/2024   T4TOTAL 6.9 11/13/2014   Last vitamin D  Lab Results  Component Value Date   VD25OH 44.40 05/16/2024        Objective:  BP 134/76 (BP Location: Left Arm, Patient Position: Sitting, Cuff Size: Large)   Pulse 64   Temp  98.2 F (36.8 C) (Oral)   Ht 5' 5.5 (1.664 m)   Wt 233 lb (105.7 kg)   SpO2 97%   BMI 38.18 kg/m      Physical Exam Vitals and nursing note reviewed.  Constitutional:      General: She is not in acute distress. Cardiovascular:     Rate and Rhythm: Normal rate and regular rhythm.     Pulses: Normal pulses.     Heart sounds: Normal heart sounds.  Pulmonary:     Effort: Pulmonary effort is normal.     Breath sounds: Normal breath sounds.  Musculoskeletal:     Cervical back: Normal range of motion and neck supple.  Neurological:     Mental Status: She is alert and oriented to person, place, and time.     Cranial Nerves: No cranial nerve deficit.     Gait: Gait normal.  Psychiatric:        Mood and Affect: Mood normal.        Behavior: Behavior normal.        Thought Content: Thought content normal.     No results found for any visits on 05/27/24.    Assessment & Plan:    Problem List Items Addressed This Visit     Fatigue - Primary   Onset 05/14/2024, led to 2 ED visits and 1urgent care visit. Positive Strep: completed treatment with amoxicillin  Negative CK, ANA, ESR, CRP, CBC, CMP, THYROID , CMV, COVID, and mono Today she reports improving fatigue and muscle weakness. Has intermittent head fullness and light headedness with movement Normal neuro exam today She has upcoming appointment with neurology to eval for any neuromuscular disorder. 06/09/2024. Check B12 and folate      Relevant Orders   B12 and Folate Panel   Other Visit Diagnoses       Generalized muscle weakness          Return in about 4 weeks (around 06/24/2024) for fatigue and generalized weakness.     Roselie Mood, NP

## 2024-05-27 NOTE — Patient Instructions (Addendum)
 Send STD form to be completed from 05/14/2024 to 06/13/2024. Go to lab Maintain appointment with neurology

## 2024-05-27 NOTE — Assessment & Plan Note (Addendum)
 Onset 05/14/2024, led to 2 ED visits and 1urgent care visit. Positive Strep: completed treatment with amoxicillin  Negative CK, ANA, ESR, CRP, CBC, CMP, THYROID , CMV, COVID, and mono Normal CT head, neck and CXR Today she reports improving fatigue and muscle weakness. Has intermittent head fullness and light headedness with movement Normal neuro exam today She has upcoming appointment with neurology to eval for any neuromuscular disorder. 06/09/2024. Check B12 and folate

## 2024-05-28 ENCOUNTER — Ambulatory Visit: Payer: Self-pay | Admitting: Nurse Practitioner

## 2024-05-30 ENCOUNTER — Ambulatory Visit: Payer: PRIVATE HEALTH INSURANCE | Admitting: Nurse Practitioner

## 2024-05-30 NOTE — Telephone Encounter (Signed)
 Copied from CRM #[id number]. Topic: General - Other >> May 30, 2024  9:04 AM Viola F wrote: Reason for CRM: Patient was seen 05/27/24 and discuss fmla. She spoke to her job and since the population is under 50 her job required a note/letter to excuse her from work.  Company: [employer]  Fax # [phone]  Attention: Kristeen Bring  Per discussed at office visit, please excuse patient from work for the following dates: 05/14/24 - 06/13/24  Please call patient with an update at [phone] (M)

## 2024-05-30 NOTE — Telephone Encounter (Signed)
 Called patient and informed her that the letter requested is ready for her to pick up. She informed me that her employer called her back and informed her that she will need FMLA paperwork will be faxed to our office. She apologized for the confusion.

## 2024-06-03 ENCOUNTER — Telehealth: Payer: Self-pay | Admitting: Nurse Practitioner

## 2024-06-03 NOTE — Telephone Encounter (Signed)
 Called patient and informed her that we did received the paper work and Roselie has paperwork to review and complete. I asked if she has a different fax number for the forms to be faxed to or if I am to fax it back to the number given on the fax cover sheet from a Mrs. Otha Senters. Patient stated that to fax back to Mrs. Senters. She also asked if I still have the letter Roselie wrote for her although she told me she did not need it. I informed her that I did and she asked if I could place it at the front office that she will come pick it up. Letter placed in front office in G folder for patient to pick up.

## 2024-06-03 NOTE — Telephone Encounter (Signed)
 Copied from CRM [id number]. Topic: General - Inquiry >> Jun 03, 2024  9:35 AM [place] wrote: Reason for CRM: Patient calling to get an update on FMLA paperwork that was needing to be sent to her work

## 2024-06-03 NOTE — Telephone Encounter (Signed)
CLINICAL USE BELOW THIS LINE (use X to signify taken)  __X__Form received and placed in providers office for signature. ____Form completed and faxed to LOA Dept. ____Form completed & LVM to notify pt ready for pick up ____Charge sheet & copy of form in front office folder for office supervisor.   

## 2024-06-05 DIAGNOSIS — Z0279 Encounter for issue of other medical certificate: Secondary | ICD-10-CM

## 2024-06-05 NOTE — Telephone Encounter (Signed)
 Called patient and informed her that the FMLA paper work has been completed and faxed. When informing her that she can pick up from the front office she asked if I could mail them to her. I verified her mailing address to what was on file. Mail paper work to patient

## 2024-06-05 NOTE — Telephone Encounter (Signed)
 CLINICAL USE BELOW THIS LINE (use X to signify taken)  ____Form received and placed in providers office for signature. __X__Form completed and faxed to LOA Dept. __X__Form completed & called to notify pt ready for pick up __X__Charge sheet & copy of form in front office folder for office supervisor.   Patient asked if I could mail the papers to her once I fax. I confirmed mailing address that's on file

## 2024-06-09 ENCOUNTER — Telehealth: Payer: Self-pay

## 2024-06-09 ENCOUNTER — Other Ambulatory Visit: Payer: Self-pay

## 2024-06-09 ENCOUNTER — Ambulatory Visit: Payer: PRIVATE HEALTH INSURANCE | Attending: Internal Medicine

## 2024-06-09 VITALS — BP 169/87 | HR 70

## 2024-06-09 DIAGNOSIS — R55 Syncope and collapse: Secondary | ICD-10-CM | POA: Insufficient documentation

## 2024-06-09 DIAGNOSIS — R42 Dizziness and giddiness: Secondary | ICD-10-CM | POA: Insufficient documentation

## 2024-06-09 DIAGNOSIS — Z7409 Other reduced mobility: Secondary | ICD-10-CM | POA: Insufficient documentation

## 2024-06-09 NOTE — Telephone Encounter (Signed)
 Copied from CRM [id number]. Topic: General - Other >> Jun 09, 2024  1:18 PM Paige D wrote: Reason for CRM: Pt Passed everything as far as therapy pt does not need to continue. But they did take Pt blood pressure it was 156/84 Two min later while standing pt blood pressure went up. Heart rate was also going up while standing up they recommended that pt reach out to Dr. And request a referral to see cardiology

## 2024-06-09 NOTE — Therapy (Signed)
 OUTPATIENT PHYSICAL THERAPY NEURO EVALUATION   Patient Name: Claire Sutton MRN: [id number] DOB:08/07/1961, 63 y.o., female Today's Date: 06/09/2024   PCP: Roselie Pickerel, NP REFERRING PROVIDER: Lue Elsie BROCKS, MD  END OF SESSION:  PT End of Session - 06/09/24 1337     Visit Number 1    Number of Visits 1    PT Start Time 0850    PT Stop Time 0930    PT Time Calculation (min) 40 min    Equipment Utilized During Treatment Gait belt    Activity Tolerance Patient tolerated treatment well    Behavior During Therapy WFL for tasks assessed/performed          Past Medical History:  Diagnosis Date   Allergy     ASCUS (atypical squamous cells of undetermined significance) on Pap smear 07/2006   NEG HIGH RISK HPV-- THEN IN 10/2006 AND 07/2007 PAPS WITH BENIGN REACTIVE CHANGES   Asthma    Cataract 2024   bilateral   GERD (gastroesophageal reflux disease)    Headache    History of blood clot in brain    a. Fall age 66 - was told she had a blood clot in the brain from this.   Hyperlipidemia    Hypertension    Iritis of both eyes    LGSIL (low grade squamous intraepithelial dysplasia) 08/2004   PAP ON 02/2005, 7,8,9,10,11,12WNL   Obesity    Seizures (HCC)    a. began after she had a fall at age 27, last seizure age 46.   Past Surgical History:  Procedure Laterality Date   CATARACT EXTRACTION, BILATERAL Bilateral 11/23/2022   COLPOSCOPY     ENDOMETRIAL ABLATION  02/2007   NOVASURE/MARSUPILIZATION OF BARTHOLIN   EYE SURGERY  11/09/2022 and 11/23/2022   Cataract surgery   MARSUPILIZATION OF BARTHOLIN  02/2007   AT TIME OF NOVASURE ABLATION SURGERY   MOUTH SURGERY     RETINAL LASER PROCEDURE Left 12/12/2023   TUBAL LIGATION     Patient Active Problem List   Diagnosis Date Noted   Fatigue 05/27/2024   Postural dizziness with presyncope 05/19/2024   Prediabetes 07/03/2023   Seasonal allergic rhinitis 12/29/2021   Neural foraminal stenosis of lumbar spine 09/21/2021    DDD (degenerative disc disease), lumbar 09/21/2021   Numbness of left foot 09/21/2021   Chronic bilateral low back pain with bilateral sciatica 08/31/2021   Postmenopausal estrogen deficiency 01/22/2019   History of colon polyps 06/13/2018   Hip pain, chronic 03/07/2018   Vitamin D  deficiency 04/18/2017   Mild intermittent asthma without complication 03/14/2017   Abnormal EKG 10/18/2016   Dyslipidemia 10/18/2016   Essential hypertension 09/15/2016   Obesity    Gastroesophageal reflux disease without esophagitis 09/07/2016   Scleritis and episcleritis of left eye 06/16/2014    ONSET DATE: 05/13/24  REFERRING DIAG: R55 (ICD-10-CM) - Near syncope   THERAPY DIAG:  Dizziness and giddiness  Impaired functional mobility, balance, gait, and endurance  Rationale for Evaluation and Treatment: Rehabilitation  SUBJECTIVE:  SUBJECTIVE STATEMENT: This 63 year old female who was seen recently in emergency department multiple times with weakness and near syncope. Symptoms begain 05/13/24.  She has been feeling fatigued and with generalized weakness since. Hospital work up was unremarkable per patient. Pt reports that symptoms started insidiously. They did stroke work up and took brain MRI which was negative. Pt reports she was extremely weak in the hospital and even the tasks like washing hands was difficult. Pt needed 1-2 people and the walker to go from bed to go to the toilet. Pt reports she never felt dizzy but reported light fog. Her main issues was extreme fatigue and achiness. Pt reports she felt her L side was weaker.   Currently she is feeling almost back to her PLOF with very mild achiness. Pt accompanied by: self  PERTINENT HISTORY:  PMH: HTN, HLD, recently on amoxicillin  05/16/14 for group A strep  infection  PAIN:  Are you having pain? No  PRECAUTIONS: None  RED FLAGS: None   WEIGHT BEARING RESTRICTIONS: No  FALLS: Has patient fallen in last 6 months? No   PLOF: Independent  PATIENT GOALS: none  OBJECTIVE:  Note: Objective measures were completed at Evaluation unless otherwise noted.  DIAGNOSTIC FINDINGS:  05/14/24 IMPRESSION: Normal brain MRI.  No acute intracranial abnormality.  COGNITION: Overall cognitive status: Within functional limits for tasks assessed    LOWER EXTREMITY ROM:     Active  Right Eval Left Eval  Hip flexion    Hip extension    Hip abduction    Hip adduction    Hip internal rotation    Hip external rotation    Knee flexion    Knee extension    Ankle dorsiflexion    Ankle plantarflexion    Ankle inversion    Ankle eversion     (Blank rows = not tested)  LOWER EXTREMITY MMT:    MMT Right Eval Left Eval  Hip flexion    Hip extension    Hip abduction    Hip adduction    Hip internal rotation    Hip external rotation    Knee flexion    Knee extension    Ankle dorsiflexion    Ankle plantarflexion    Ankle inversion    Ankle eversion    (Blank rows = not tested)  GAIT: WFL   FUNCTIONAL TESTS:  10 meter walk test: 1.22 m/s without AD Functional gait assessment:  FUNCTIONAL GAIT ASSESSMENT  Date: 06/09/24 Score  GAIT LEVEL SURFACE Instructions: Walk at your normal speed from here to the next mark (6 m) [20 ft]. (3) Normal - Walks 6 m (20 ft) in less than 5.5 seconds, no assistive devices, good speed, no evidence for imbalance, normal gait pattern, deviates no more than 15.24 cm (6 in) outside of the 30.48-cm (12-in) walkway width.  2.   CHANGE IN GAIT SPEED Instructions: Begin walking at your normal pace (for 1.5 m [5 ft]). When I tell you "go," walk as fast as you can (for 1.5 m [5 ft]). When I tell you "slow," walk as slowly as you can (for 1.5 m [5 ft]. (3) Normal - Able to smoothly change walking speed without loss of  balance or gait deviation. Shows a significant difference in walking speeds between normal, fast, and slow speeds. Deviates no more than 15.24 cm (6 in) outside of the 30.48-cm (12-in) walkway width.  3.    GAIT WITH HORIZONTAL HEAD TURNS Instructions: Walk from here to the next mark 6  m (20 ft) away. Begin walking at your normal pace. Keep walking straight; after 3 steps, turn your head to the right and keep walking straight while looking to the right. After 3 more steps, turn your head to the left and keep walking straight while looking left. Continue alternating looking right and left. (3) Normal - Performs head turns smoothly with no change in gait. Deviates no more than 15.24 cm (6 in) outside 30.48-cm (12-in) walkway width.  4.   GAIT WITH VERTICAL HEAD TURNS Instructions: Walk from here to the next mark (6 m [20 ft]). Begin walking at your normal pace. Keep walking straight; after 3 steps, tip your head up and keep walking straight while looking up. After 3 more steps, tip your head down, keep walking straight while looking down. Continue  alternating looking up and down every 3 steps until you have completed 2 repetitions in each direction. (3) Normal - Performs head turns with no change in gait. Deviates no more than 15.24 cm (6 in) outside 30.48-cm (12-in) walkway width.  5.  GAIT AND PIVOT TURN Instructions: Begin with walking at your normal pace. When I tell you, "turn and stop," turn as quickly as you can to face the opposite direction and stop. (3) Normal - Pivot turns safely within 3 seconds and stops quickly with no loss of balance  6.   STEP OVER OBSTACLE Instructions: Begin walking at your normal speed. When you come to the shoe box, step over it, not around it, and keep walking. (3) Normal - Is able to step over 2 stacked shoe boxes taped together (22.86 cm [9 in] total height) without changing gait speed; no evidence of imbalance.  7.   GAIT WITH NARROW BASE OF SUPPORT Instructions:  Walk on the floor with arms folded across the chest, feet aligned heel to toe in tandem for a distance of 3.6 m [12 ft]. The number of steps taken in a straight line are counted for a maximum of 10 steps. (3) Normal - Is able to ambulate for 10 steps heel to toe with no staggering.  8.   GAIT WITH EYES CLOSED Instructions: Walk at your normal speed from here to the next mark (6 m [20 ft]) with your eyes closed. (3) Normal - Walks 6 m (20 ft), no assistive devices, good speed, no evidence of imbalance, normal gait pattern, deviates no more than 15.24 cm (6 in) outside 30.48-cm (12-in) walkway width. Ambulates 6 m (20 ft) in less than 7 seconds.  9.   AMBULATING BACKWARDS Instructions: Walk backwards until I tell you to stop (3) Normal - Walks 6 m (20 ft), no assistive devices, good speed, no evidence for imbalance, normal gait pattern, deviates no more than 15.24 cm (6 in) outside 30.48-cm (12-in) walkway width.  10. STEPS Instructions: Walk up these stairs as you would at home (ie, using the rail if necessary). At the top turn around and walk down. (3) Normal-Alternating feet, no rail.  Total 30/30   Interpretation of scores: Non-Specific Older Adults Cutoff Score: <=22/30 = risk of falls Parkinson's Disease Cutoff score <15/30= fall risk (Hoehn & Yahr 1-4)  Minimally Clinically Important Difference (MCID)  Stroke (acute, subacute, and chronic) = MDC: 4.2 points Vestibular (acute) = MDC: 6 points Community Dwelling Older Adults =  MCID: 4 points Parkinson's Disease  =  MDC: 4.3 points  (Academy of Neurologic Physical Therapy (nd). Functional Gait Assessment. Retrieved from https://www.neuropt.org/docs/default-source/cpgs/core-outcome-measures/function-gait-assessment-pocket-guide-proof9-(2).pdf?sfvrsn=b70f35043_0.)  TREATMENT DATE: 06/09/24  Vitals:   06/09/24 0903  06/09/24 0905 06/09/24 0918 06/09/24 0920  BP: (!) 150/84 Comment: Sitting (!) 140/90 Comment: Standing (!) 169/85 Comment: after walking for 5'- seated (!) 169/87 Comment: after 2' of rest and immediately after standing  Pulse: 64 (!) 103 64 70     Discussed evaluation findings with patient that her gait speed and gait and balance are [facility] currently. Her vitals varied during the session. Pt reported that she didn't take her BP medications this morning.  Pt was educated that based on her symptoms in the hospital, subjective interview, her symptoms of excessive fatigue and weakness may be related to some sort of anomaly with her blood pressure. Patient was give a BP log and was educated take her BP, 3x day and log it for a month at least and share the findings with her PCP. Patient was educated on importance of compliance with medication. Patient was also given BP chart from [facility] to educate her on normal BP and different levels of HTN.  PATIENT EDUCATION: Education details: see above Person educated: Patient Education method: Explanation Education comprehension: verbalized understanding  HOME EXERCISE PROGRAM: None issued  GOALS: NO goals established this visit as this was one time visit only.   ASSESSMENT:  CLINICAL IMPRESSION: Patient is a 63 y.o. female who was seen today for physical therapy evaluation and treatment for near syncope episode that resulted in hospitalization. Objective findings did not reveal any significant findings with her gait, balance, or strength. Patient is currently functioning at [facility] physically. Patient did have elevated BP in the session today and may need further BP monitoring from patient and PCP. Patient was provided education on importance of maintaining normal BP with exercise and compliance with medication and discuss further evaluation options with PCP. Patient will be discharged from skilled PT and will not require further outpatient PT for current  episode..   OBJECTIVE IMPAIRMENTS: none.   ACTIVITY LIMITATIONS: none  PARTICIPATION LIMITATIONS: none  PERSONAL FACTORS: Time since onset of injury/illness/exacerbation are also affecting patient's functional outcome.   REHAB POTENTIAL: Excellent  CLINICAL DECISION MAKING: Stable/uncomplicated  EVALUATION COMPLEXITY: Low  PLAN:  PT FREQUENCY: one time visit   Raj LOISE Blanch, PT 06/09/2024, 1:48 PM

## 2024-06-09 NOTE — Telephone Encounter (Signed)
 Copied from CRM #[id number]. Topic: Referral - Request for Referral >> Jun 09, 2024  1:21 PM Paige D wrote: Did the patient discuss referral with their provider in the last year? Yes years ago (If No - schedule appointment) (If Yes - send message)  Appointment offered? Yes  Type of order/referral and detailed reason for visit: Cardiologist   Preference of office, provider, location: [facility]   If referral order, have you been seen by this specialty before? No (If Yes, this issue or another issue? When? Where?  Can we respond through MyChart? Yes

## 2024-06-09 NOTE — Telephone Encounter (Signed)
 Spoke to patient and she stated elevated BP reading while at physical therapy on 06/09/2024 and was advised that cardiology referral is needed due tachy HR and elevated BP reading with ranges from 150/84 HR 64 sitting and standing 140/90, HR 103 bmp. BP log was also given. Please advise. Pt is also taking losartan  (COZAAR ) 50 MG tablet   LOV 05/27/2024 FOV 06/27/2024

## 2024-06-10 NOTE — Telephone Encounter (Signed)
 Called patient and informed her of Charlotte'e comment of needing to be seen sooner. Patient is scheduled for an office appointment for 06/13/24 at 11:20 AM. Patient wanted me to let Roselie know that her BP readings today were 128/81 prior to taking medications, 117/79 2-3 mins after taking medications and 123/80 2 hours after taking medications.

## 2024-06-12 NOTE — Telephone Encounter (Signed)
 Patient called and informed to continue to monitor BP daily and to give the office a call if her BP is >140/90. She wanted to let Roselie know that she noticed that her HR increase after using the eye drops and has started using the drops BID instead of the QID.

## 2024-06-13 ENCOUNTER — Ambulatory Visit: Payer: PRIVATE HEALTH INSURANCE | Admitting: Nurse Practitioner

## 2024-06-27 ENCOUNTER — Encounter: Payer: Self-pay | Admitting: Nurse Practitioner

## 2024-06-27 ENCOUNTER — Ambulatory Visit (INDEPENDENT_AMBULATORY_CARE_PROVIDER_SITE_OTHER): Payer: PRIVATE HEALTH INSURANCE | Admitting: Nurse Practitioner

## 2024-06-27 VITALS — BP 130/76 | HR 59 | Temp 98.2°F | Ht 66.0 in | Wt 234.8 lb

## 2024-06-27 DIAGNOSIS — R5383 Other fatigue: Secondary | ICD-10-CM

## 2024-06-27 NOTE — Assessment & Plan Note (Signed)
 All symptoms resolved Normal B12 and folate Returned to work 06/16/2024 part time Had 1 neuromuscular PT session, released due to normal evaluation.  No additional recommendations

## 2024-06-27 NOTE — Progress Notes (Signed)
 Established Patient Visit  Patient: Claire Sutton   DOB: 08/13/1961   63 y.o. Female  MRN: [id number] Visit Date: 06/27/2024  Subjective:    Chief Complaint  Patient presents with   Follow-up    4 week follow up for fatigue and generalized weakness DUE for Pneumococcal vaccine    HPI Fatigue All symptoms resolved Normal B12 and folate Returned to work 06/16/2024 part time Had 1 neuromuscular PT session, released due to normal evaluation.  No additional recommendations  Reviewed medical, surgical, and social history today  Medications: Outpatient Medications Prior to Visit  Medication Sig   albuterol  (VENTOLIN  HFA) 108 (90 Base) MCG/ACT inhaler Inhale 2 puffs into the lungs every 6 (six) hours as needed for wheezing or shortness of breath.   amLODipine  (NORVASC ) 5 MG tablet Take 1 tablet (5 mg total) by mouth daily.   atorvastatin  (LIPITOR) 20 MG tablet TAKE 1 TABLET(20 MG) BY MOUTH DAILY   Bromfenac Sodium 0.07 % SOLN Place 1 drop into the left eye daily.   cetirizine (ZYRTEC) 10 MG tablet Take 10 mg by mouth daily.   Cholecalciferol  (VITAMIN D -3 PO) Take 2 each by mouth daily. OTC vitamin D -3 gummy   diclofenac  Sodium (VOLTAREN ) 1 % GEL Apply 1 Application topically 2 (two) times daily as needed (hip pain).   EPINEPHrine  0.3 mg/0.3 mL IJ SOAJ injection Inject 0.3 mg into the muscle as needed for anaphylaxis.   losartan  (COZAAR ) 50 MG tablet Take 1 tablet (50 mg total) by mouth daily.   montelukast  (SINGULAIR ) 10 MG tablet TAKE 1 TABLET(10 MG) BY MOUTH AT BEDTIME (Patient taking differently: Take 10 mg by mouth daily.)   Multiple Vitamins-Minerals (MULTIVITAMIN GUMMIES WOMENS) CHEW Chew 2 each by mouth daily.   omeprazole  (PRILOSEC) 20 MG capsule Take 1 capsule (20 mg total) by mouth daily.   prednisoLONE acetate (PRED FORTE) 1 % ophthalmic suspension Place 1 drop into the left eye See admin instructions. Administer 1 drop into the LEFT eye once daily, may  increase to 1 drop 4 times daily as needed for flares,eye irritation.   [DISCONTINUED] Cyanocobalamin  (VITAMIN B-12 PO) Take 2 each by mouth daily. OTC vitamin B-12 gummy   No facility-administered medications prior to visit.   Reviewed past medical and social history.   ROS per HPI above      Objective:  BP 130/76 (BP Location: Left Arm, Patient Position: Supine, Cuff Size: Normal)   Pulse (!) 59   Temp 98.2 F (36.8 C) (Oral)   Ht 5' 6 (1.676 m)   Wt 234 lb 12.8 oz (106.5 kg)   SpO2 98%   BMI 37.90 kg/m      Physical Exam Vitals and nursing note reviewed.  Cardiovascular:     Rate and Rhythm: Normal rate.     Pulses: Normal pulses.  Pulmonary:     Effort: Pulmonary effort is normal.  Musculoskeletal:     Right lower leg: No edema.     Left lower leg: No edema.  Neurological:     Mental Status: She is alert and oriented to person, place, and time.  Psychiatric:        Mood and Affect: Mood normal.        Behavior: Behavior normal.        Thought Content: Thought content normal.     No results found for any visits on 06/27/24.    Assessment & Plan:  Problem List Items Addressed This Visit     Fatigue - Primary   All symptoms resolved Normal B12 and folate Returned to work 06/16/2024 part time Had 1 neuromuscular PT session, released due to normal evaluation.  No additional recommendations       Return in about 3 months (around 09/27/2024) for CPE (fasting).     Roselie Mood, NP

## 2024-06-27 NOTE — Patient Instructions (Addendum)
 Reschedule CPE appt

## 2024-07-06 ENCOUNTER — Ambulatory Visit
Admission: EM | Admit: 2024-07-06 | Discharge: 2024-07-06 | Disposition: A | Discharge status: Home / Self Care | Payer: Self-pay | Attending: Internal Medicine | Admitting: Internal Medicine

## 2024-07-06 DIAGNOSIS — L03115 Cellulitis of right lower limb: Secondary | ICD-10-CM

## 2024-07-06 DIAGNOSIS — L02419 Cutaneous abscess of limb, unspecified: Secondary | ICD-10-CM

## 2024-07-06 DIAGNOSIS — L03119 Cellulitis of unspecified part of limb: Secondary | ICD-10-CM

## 2024-07-06 MED ORDER — DOXYCYCLINE HYCLATE 100 MG PO CAPS: 100.0000 mg | ORAL_CAPSULE | Freq: Two times a day (BID) | ORAL | 0 refills | Status: AC

## 2024-07-06 NOTE — Discharge Instructions (Addendum)
 You have cellulitis which is a superficial skin infection.  Take antibiotic every 12 hours with a snack/food for the next 7 days. Watch for worsening signs of infection to the lesion such as spreading redness, swelling, pus drainage, pain, fever/chills. I have drawn a circle around the area of redness with a skin pen today, if the redness grows past the circle significantly in the next 24-48 hours, please return to urgent care for reevaluation.  Return if you develop fever, chills, nausea, vomiting, etc as well.  If you develop any new or worsening symptoms or if your symptoms do not start to improve, please return here or follow-up with your primary care provider. If your symptoms are severe, please go to the emergency room.

## 2024-07-06 NOTE — ED Triage Notes (Signed)
 Pt presents with insect bite to right calf. Pt thinks she got bitten Thursday night. Site has become swollen and red.

## 2024-07-06 NOTE — ED Provider Notes (Signed)
 [facility]    CSN: [id number] Arrival date & time: 07/06/24  1411      History   Chief Complaint Chief Complaint  Patient presents with   Insect Bite    HPI Claire Sutton is a 63 y.o. female.   Claire Sutton is a 63 y.o. female presenting for chief complaint of Insect Bite to the right calf that she first noticed 3 days ago. She started noticing swelling, redness, and pain to the area 24 hours after initial bite was noticed, swelling and redness has progressively worsened over the last 2-3 days. Denies known tick bite to the area. Denies fever, chills, nausea, vomiting, headache, body aches. Recently treated for strep throat with amoxicillin  in July 2025, denies other use of antibiotics in the last 90 days. She has not attempted use of any treatment at home.      Past Medical History:  Diagnosis Date   Allergy     ASCUS (atypical squamous cells of undetermined significance) on Pap smear 07/2006   NEG HIGH RISK HPV-- THEN IN 10/2006 AND 07/2007 PAPS WITH BENIGN REACTIVE CHANGES   Asthma    Cataract 2024   bilateral   GERD (gastroesophageal reflux disease)    Headache    History of blood clot in brain    a. Fall age 49 - was told she had a blood clot in the brain from this.   Hyperlipidemia    Hypertension    Iritis of both eyes    LGSIL (low grade squamous intraepithelial dysplasia) 08/2004   PAP ON 02/2005, 7,8,9,10,11,12WNL   Obesity    Seizures (HCC)    a. began after she had a fall at age 62, last seizure age 15.    Patient Active Problem List   Diagnosis Date Noted   Fatigue 05/27/2024   Postural dizziness with presyncope 05/19/2024   Prediabetes 07/03/2023   Seasonal allergic rhinitis 12/29/2021   Neural foraminal stenosis of lumbar spine 09/21/2021   DDD (degenerative disc disease), lumbar 09/21/2021   Numbness of left foot 09/21/2021   Chronic bilateral low back pain with bilateral sciatica 08/31/2021   Postmenopausal estrogen  deficiency 01/22/2019   History of colon polyps 06/13/2018   Hip pain, chronic 03/07/2018   Vitamin D  deficiency 04/18/2017   Mild intermittent asthma without complication 03/14/2017   Abnormal EKG 10/18/2016   Dyslipidemia 10/18/2016   Essential hypertension 09/15/2016   Obesity    Gastroesophageal reflux disease without esophagitis 09/07/2016   Scleritis and episcleritis of left eye 06/16/2014    Past Surgical History:  Procedure Laterality Date   CATARACT EXTRACTION, BILATERAL Bilateral 11/23/2022   COLPOSCOPY     ENDOMETRIAL ABLATION  02/2007   NOVASURE/MARSUPILIZATION OF BARTHOLIN   EYE SURGERY  11/09/2022 and 11/23/2022   Cataract surgery   MARSUPILIZATION OF BARTHOLIN  02/2007   AT TIME OF NOVASURE ABLATION SURGERY   MOUTH SURGERY     RETINAL LASER PROCEDURE Left 12/12/2023   TUBAL LIGATION      OB History     Gravida  3   Para  2   Term      Preterm      AB  1   Living  2      SAB      IAB  1   Ectopic      Multiple      Live Births               Home Medications  Prior to Admission medications   Medication Sig Start Date End Date Taking? Authorizing Provider  doxycycline  (VIBRAMYCIN ) 100 MG capsule Take 1 capsule (100 mg total) by mouth 2 (two) times daily. 07/06/24  Yes Enedelia Dorna HERO, FNP  albuterol  (VENTOLIN  HFA) 108 (90 Base) MCG/ACT inhaler Inhale 2 puffs into the lungs every 6 (six) hours as needed for wheezing or shortness of breath. 01/09/24   Nche, Roselie Rockford, NP  amLODipine  (NORVASC ) 5 MG tablet Take 1 tablet (5 mg total) by mouth daily. 01/09/24   Nche, Roselie Rockford, NP  atorvastatin  (LIPITOR) 20 MG tablet TAKE 1 TABLET(20 MG) BY MOUTH DAILY 01/09/24   Nche, Roselie Rockford, NP  Bromfenac Sodium 0.07 % SOLN Place 1 drop into the left eye daily.    [provider]  cetirizine (ZYRTEC) 10 MG tablet Take 10 mg by mouth daily. 12/03/15   [provider]  Cholecalciferol  (VITAMIN D -3 PO) Take 2 each by mouth  daily. OTC vitamin D -3 gummy    [provider]  diclofenac  Sodium (VOLTAREN ) 1 % GEL Apply 1 Application topically 2 (two) times daily as needed (hip pain).    [provider]  EPINEPHrine  0.3 mg/0.3 mL IJ SOAJ injection Inject 0.3 mg into the muscle as needed for anaphylaxis. 05/27/21   Nche, Roselie Rockford, NP  losartan  (COZAAR ) 50 MG tablet Take 1 tablet (50 mg total) by mouth daily. 01/09/24   Nche, Roselie Rockford, NP  montelukast  (SINGULAIR ) 10 MG tablet TAKE 1 TABLET(10 MG) BY MOUTH AT BEDTIME Patient taking differently: Take 10 mg by mouth daily. 01/09/24   Nche, Roselie Rockford, NP  Multiple Vitamins-Minerals (MULTIVITAMIN GUMMIES WOMENS) CHEW Chew 2 each by mouth daily.    [provider]  omeprazole  (PRILOSEC) 20 MG capsule Take 1 capsule (20 mg total) by mouth daily. 01/09/24   Nche, Roselie Rockford, NP  prednisoLONE acetate (PRED FORTE) 1 % ophthalmic suspension Place 1 drop into the left eye See admin instructions. Administer 1 drop into the LEFT eye once daily, may increase to 1 drop 4 times daily as needed for flares,eye irritation. 12/04/23   [provider]    Family History Family History  Problem Relation Age of Onset   Hypertension Mother    Hyperlipidemia Mother    Heart disease Mother        was told she may have had a few MIs that she was unaware of   Diabetes Father    Arthritis Sister        lumbar DDD with spinal stenosis   Hypertension Sister    Diabetes Paternal Grandfather    Arthritis Son    Colon cancer Neg Hx    Colon polyps Neg Hx    Esophageal cancer Neg Hx    Stomach cancer Neg Hx    Rectal cancer Neg Hx    Breast cancer Neg Hx     Social History Social History   Tobacco Use   Smoking status: Never    Passive exposure: Past   Smokeless tobacco: Never  Vaping Use   Vaping status: Never Used  Substance Use Topics   Alcohol use: Never   Drug use: Never     Allergies   Codeine and Flexeril   [cyclobenzaprine ]   Review of Systems Review of Systems Per HPI  Physical Exam Triage Vital Signs ED Triage Vitals  Encounter Vitals Group     BP 07/06/24 1509 (!) 146/89     Girls Systolic BP Percentile --  Girls Diastolic BP Percentile --      Boys Systolic BP Percentile --      Boys Diastolic BP Percentile --      Pulse Rate 07/06/24 1509 62     Resp 07/06/24 1509 16     Temp 07/06/24 1509 98.3 F (36.8 C)     Temp Source 07/06/24 1509 Oral     SpO2 07/06/24 1509 96 %     Weight --      Height --      Head Circumference --      Peak Flow --      Pain Score 07/06/24 1512 5     Pain Loc --      Pain Education --      Exclude from Growth Chart --    No data found.  Updated Vital Signs BP (!) 146/89   Pulse 62   Temp 98.3 F (36.8 C) (Oral)   Resp 16   SpO2 96%   Visual Acuity Right Eye Distance:   Left Eye Distance:   Bilateral Distance:    Right Eye Near:   Left Eye Near:    Bilateral Near:     Physical Exam Vitals and nursing note reviewed.  Constitutional:      Appearance: She is not ill-appearing or toxic-appearing.  HENT:     Head: Normocephalic and atraumatic.     Right Ear: Hearing and external ear normal.     Left Ear: Hearing and external ear normal.     Nose: Nose normal.     Mouth/Throat:     Lips: Pink.  Eyes:     General: Lids are normal. Vision grossly intact. Gaze aligned appropriately.     Extraocular Movements: Extraocular movements intact.     Conjunctiva/sclera: Conjunctivae normal.  Pulmonary:     Effort: Pulmonary effort is normal.  Musculoskeletal:     Cervical back: Neck supple.  Skin:    General: Skin is warm and dry.     Capillary Refill: Capillary refill takes less than 2 seconds.     Findings: Erythema and lesion present. No rash.     Comments: Lesion with central pustule with surrounding erythema/warmth/tenderness and minimal induration to soft tissue present to the right posterior calf.  Negative Homans sign,  +2 bilateral AT pulses.  See image of wound below.  Erythema measures 7.5cm by 9cm.   Neurological:     General: No focal deficit present.     Mental Status: She is alert and oriented to person, place, and time. Mental status is at baseline.     Cranial Nerves: No dysarthria or facial asymmetry.  Psychiatric:        Mood and Affect: Mood normal.        Speech: Speech normal.        Behavior: Behavior normal.        Thought Content: Thought content normal.        Judgment: Judgment normal.        UC Treatments / Results  Labs (all labs ordered are listed, but only abnormal results are displayed) Labs Reviewed - No data to display  EKG   Radiology No results found.  Procedures Procedures (including critical care time)  Medications Ordered in UC Medications - No data to display  Initial Impression / Assessment and Plan / UC Course  I have reviewed the triage vital signs and the nursing notes.  Pertinent labs & imaging results that were available during my care  of the patient were reviewed by me and considered in my medical decision making (see chart for details).   1. Cellulitis and abscess of leg except foot, cellulitis of right leg Cellulitis and abscess of right leg to be treated with doxycycline  BID for 7 days. 10 days worth of antibiotic prescribed. She may stop taking antibiotic at day 7 if no further purulence, induration, warmth, or erythema is present to the leg and symptoms have completely resolved. If not, she may take 3 more days of doxycycline  for complete resolution of symptoms.   Warm compresses recommended.  PRN Tylenol  for pain.  Area outlined with skin pen today to monitor progress of wound.  Advised to return to clinic or PCP if erythema spreads signficantly past area outlined in the next 2 days despite antibiotic use or if she develops systemic symptoms.   Counseled patient on potential for adverse effects with medications prescribed/recommended  today, strict ER and return-to-clinic precautions discussed, patient verbalized understanding.    Final Clinical Impressions(s) / UC Diagnoses   Final diagnoses:  Cellulitis and abscess of leg, except foot  Cellulitis of leg, right     Discharge Instructions      You have cellulitis which is a superficial skin infection.  Take antibiotic every 12 hours with a snack/food for the next 7 days. Watch for worsening signs of infection to the lesion such as spreading redness, swelling, pus drainage, pain, fever/chills. I have drawn a circle around the area of redness with a skin pen today, if the redness grows past the circle significantly in the next 24-48 hours, please return to urgent care for reevaluation.  Return if you develop fever, chills, nausea, vomiting, etc as well.  If you develop any new or worsening symptoms or if your symptoms do not start to improve, please return here or follow-up with your primary care provider. If your symptoms are severe, please go to the emergency room.    ED Prescriptions     Medication Sig Dispense Auth. Provider   doxycycline  (VIBRAMYCIN ) 100 MG capsule Take 1 capsule (100 mg total) by mouth 2 (two) times daily. 20 capsule Enedelia Dorna HERO, FNP      PDMP not reviewed this encounter.   Enedelia Dorna [place], [place] 07/08/24 [phone]

## 2024-07-14 ENCOUNTER — Encounter: Payer: PRIVATE HEALTH INSURANCE | Admitting: Nurse Practitioner

## 2024-09-29 ENCOUNTER — Encounter: Payer: Self-pay | Admitting: Nurse Practitioner

## 2024-09-29 ENCOUNTER — Ambulatory Visit (INDEPENDENT_AMBULATORY_CARE_PROVIDER_SITE_OTHER): Payer: PRIVATE HEALTH INSURANCE | Admitting: Nurse Practitioner

## 2024-09-29 VITALS — BP 140/80 | HR 71 | Temp 98.6°F | Ht 66.0 in | Wt 231.4 lb

## 2024-09-29 DIAGNOSIS — E559 Vitamin D deficiency, unspecified: Secondary | ICD-10-CM | POA: Diagnosis not present

## 2024-09-29 DIAGNOSIS — R7303 Prediabetes: Secondary | ICD-10-CM | POA: Diagnosis not present

## 2024-09-29 DIAGNOSIS — I10 Essential (primary) hypertension: Secondary | ICD-10-CM

## 2024-09-29 DIAGNOSIS — E7800 Pure hypercholesterolemia, unspecified: Secondary | ICD-10-CM

## 2024-09-29 DIAGNOSIS — Z0001 Encounter for general adult medical examination with abnormal findings: Secondary | ICD-10-CM

## 2024-09-29 LAB — COMPREHENSIVE METABOLIC PANEL WITH GFR
ALT: 28 U/L (ref 0–35)
AST: 25 U/L (ref 0–37)
Albumin: 3.8 g/dL (ref 3.5–5.2)
Alkaline Phosphatase: 84 U/L (ref 39–117)
BUN: 13 mg/dL (ref 6–23)
CO2: 29 meq/L (ref 19–32)
Calcium: 9.2 mg/dL (ref 8.4–10.5)
Chloride: 106 meq/L (ref 96–112)
Creatinine, Ser: 1.02 mg/dL (ref 0.40–1.20)
GFR: 58.07 mL/min — ABNORMAL LOW (ref >60.00)
Glucose, Bld: 98 mg/dL (ref 70–99)
Potassium: 3.5 meq/L (ref 3.5–5.1)
Sodium: 141 meq/L (ref 135–145)
Total Bilirubin: 0.7 mg/dL (ref 0.2–1.2)
Total Protein: 7.2 g/dL (ref 6.0–8.3)

## 2024-09-29 LAB — LIPID PANEL
Cholesterol: 162 mg/dL (ref 0–200)
HDL: 52.3 mg/dL (ref >39.00)
LDL Cholesterol: 101 mg/dL — ABNORMAL HIGH (ref 0–99)
NonHDL: 109.91
Total CHOL/HDL Ratio: 3
Triglycerides: 45 mg/dL (ref 0.0–149.0)
VLDL: 9 mg/dL (ref 0.0–40.0)

## 2024-09-29 LAB — HEMOGLOBIN A1C: Hgb A1c MFr Bld: 5.8 % (ref 4.6–6.5)

## 2024-09-29 LAB — VITAMIN D 25 HYDROXY (VIT D DEFICIENCY, FRACTURES): VITD: 51.34 ng/mL (ref 30.00–100.00)

## 2024-09-29 NOTE — Assessment & Plan Note (Signed)
 Repeat hgbA1c

## 2024-09-29 NOTE — Assessment & Plan Note (Signed)
No adverse effects with atorvastatin Repeat lipid panel today 

## 2024-09-29 NOTE — Assessment & Plan Note (Signed)
 Repeat vit D

## 2024-09-29 NOTE — Progress Notes (Signed)
 Complete physical exam  Patient: Claire Sutton   DOB: 03/13/1961   63 y.o. Female  MRN: [id number] Visit Date: 09/29/2024  Subjective:    Chief Complaint  Patient presents with   Annual Exam    No concerns. Fasting. BP elevated. Pt reports taking BP meds. Recent death of mother may be causing stress.   Claire Sutton is a 63 y.o. female who presents today for a complete physical exam. She reports consuming a low fat and low sodium diet. Walking daily She generally feels well. She reports sleeping well. She does have additional problems to discuss today.  Vision:Yes Dental:Within Last 6 months STD Screen:No  BP Readings from Last 3 Encounters:  09/29/24 (!) 140/80  07/06/24 (!) 146/89  06/27/24 130/76   Wt Readings from Last 3 Encounters:  09/29/24 231 lb 6.4 oz (105 kg)  06/27/24 234 lb 12.8 oz (106.5 kg)  05/27/24 233 lb (105.7 kg)   Most recent fall risk assessment:    09/29/2024    8:53 AM  Fall Risk   Falls in the past year? 0  Number falls in past yr: 0  Injury with Fall? 0  Risk for fall due to : No Fall Risks   Depression screen:Yes - No Depression  Most recent depression screenings:    09/29/2024    8:54 AM 09/29/2024    8:53 AM  PHQ 2/9 Scores  PHQ - 2 Score 0 0  PHQ- 9 Score 0     HPI  Essential hypertension Elevated BP today Reports compliance with DASH diet and med doses (losartan  and amlodipine ) BP Readings from Last 3 Encounters:  09/29/24 (!) 140/80  07/06/24 (!) 146/89  06/27/24 130/76    Advised to Check BP at home in AM, no less than 3x/week. Call office if BP >140/80 for more than 2days Send BP readings via mychart  Vitamin D  deficiency Repeat vit.D  Prediabetes Repeat hgbA1c  Dyslipidemia No adverse effects with atorvastatin  Repeat lipid panel today   Past Medical History:  Diagnosis Date   Allergy     ASCUS (atypical squamous cells of undetermined significance) on Pap smear 07/2006   NEG HIGH RISK HPV-- THEN IN  10/2006 AND 07/2007 PAPS WITH BENIGN REACTIVE CHANGES   Asthma    Cataract 2024   bilateral   GERD (gastroesophageal reflux disease)    Headache    History of blood clot in brain    a. Fall age 31 - was told she had a blood clot in the brain from this.   Hyperlipidemia    Hypertension    Iritis of both eyes    LGSIL (low grade squamous intraepithelial dysplasia) 08/2004   PAP ON 02/2005, 7,8,9,10,11,12WNL   Obesity    Seizures (HCC)    a. began after she had a fall at age 62, last seizure age 74.   Past Surgical History:  Procedure Laterality Date   CATARACT EXTRACTION, BILATERAL Bilateral 11/23/2022   COLPOSCOPY     ENDOMETRIAL ABLATION  02/2007   NOVASURE/MARSUPILIZATION OF BARTHOLIN   EYE SURGERY  11/09/2022 and 11/23/2022   Cataract surgery   MARSUPILIZATION OF BARTHOLIN  02/2007   AT TIME OF NOVASURE ABLATION SURGERY   MOUTH SURGERY     RETINAL LASER PROCEDURE Left 12/12/2023   TUBAL LIGATION     Social History   Socioeconomic History   Marital status: Single    Spouse name: Not on file   Number of children: Not on file   Years  of education: Not on file   Highest education level: Some college, no degree  Occupational History   Not on file  Tobacco Use   Smoking status: Never    Passive exposure: Past   Smokeless tobacco: Never  Vaping Use   Vaping status: Never Used  Substance and Sexual Activity   Alcohol use: Never   Drug use: Never   Sexual activity: Not Currently    Partners: Male    Birth control/protection: Post-menopausal, Surgical    Comment: 1st intercourse 63 yo-More than 5 partners, btl  Other Topics Concern   Not on file  Social History Narrative   Not on file   Social Drivers of Health   Financial Resource Strain: Low Risk  (09/26/2024)   Overall Financial Resource Strain (CARDIA)    Difficulty of Paying Living Expenses: Not hard at all  Food Insecurity: No Food Insecurity (09/26/2024)   Hunger Vital Sign    Worried About Running Out of  Food in the Last Year: Never true    Ran Out of Food in the Last Year: Never true  Transportation Needs: No Transportation Needs (09/26/2024)   PRAPARE - Administrator, Civil Service (Medical): No    Lack of Transportation (Non-Medical): No  Physical Activity: Sufficiently Active (09/26/2024)   Exercise Vital Sign    Days of Exercise per Week: 5 days    Minutes of Exercise per Session: 30 min  Stress: No Stress Concern Present (09/26/2024)   [employer] of Occupational Health - Occupational Stress Questionnaire    Feeling of Stress: Not at all  Social Connections: Moderately Integrated (09/26/2024)   Social Connection and Isolation Panel    Frequency of Communication with Friends and Family: More than three times a week    Frequency of Social Gatherings with Friends and Family: More than three times a week    Attends Religious Services: More than 4 times per year    Active Member of [employer] or Organizations: Yes    Attends Banker Meetings: More than 4 times per year    Marital Status: Never married  Intimate Partner Violence: Not At Risk (05/19/2024)   Humiliation, Afraid, Rape, and Kick questionnaire    Fear of Current or Ex-Partner: No    Emotionally Abused: No    Physically Abused: No    Sexually Abused: No   Family Status  Relation Name Status   Mother Mom Deceased   Father Dad Deceased   Sister Sis Alive   Son Lynze Reddy Alive   MGM  Deceased   MGF  Deceased   PGM  Deceased   PGF Monroe Deceased   Neg Hx  (Not Specified)  No partnership data on file   Family History  Problem Relation Age of Onset   Hypertension Mother    Hyperlipidemia Mother    Heart disease Mother        was told she may have had a few MIs that she was unaware of   Diabetes Father    Arthritis Sister        lumbar DDD with spinal stenosis   Hypertension Sister    Arthritis Son    Diabetes Paternal Grandfather    Colon cancer Neg Hx    Colon polyps Neg Hx     Esophageal cancer Neg Hx    Stomach cancer Neg Hx    Rectal cancer Neg Hx    Breast cancer Neg Hx    Allergies  Allergen Reactions  Codeine Other (See Comments)    Cold sweats Lightheadedness   Flexeril  [Cyclobenzaprine ] Rash    Patient Care Team: Lenyx Boody, Roselie Rockford, NP as PCP - General (Internal Medicine)   Medications: Outpatient Medications Prior to Visit  Medication Sig   albuterol  (VENTOLIN  HFA) 108 (90 Base) MCG/ACT inhaler Inhale 2 puffs into the lungs every 6 (six) hours as needed for wheezing or shortness of breath.   amLODipine  (NORVASC ) 5 MG tablet Take 1 tablet (5 mg total) by mouth daily.   atorvastatin  (LIPITOR) 20 MG tablet TAKE 1 TABLET(20 MG) BY MOUTH DAILY   Bromfenac Sodium 0.07 % SOLN Place 1 drop into the left eye daily.   cetirizine (ZYRTEC) 10 MG tablet Take 10 mg by mouth daily.   Cholecalciferol  (VITAMIN D -3 PO) Take 2 each by mouth daily. OTC vitamin D -3 gummy   diclofenac  Sodium (VOLTAREN ) 1 % GEL Apply 1 Application topically 2 (two) times daily as needed (hip pain).   doxycycline  (VIBRAMYCIN ) 100 MG capsule Take 1 capsule (100 mg total) by mouth 2 (two) times daily.   EPINEPHrine  0.3 mg/0.3 mL IJ SOAJ injection Inject 0.3 mg into the muscle as needed for anaphylaxis.   losartan  (COZAAR ) 50 MG tablet Take 1 tablet (50 mg total) by mouth daily.   montelukast  (SINGULAIR ) 10 MG tablet TAKE 1 TABLET(10 MG) BY MOUTH AT BEDTIME   Multiple Vitamins-Minerals (MULTIVITAMIN GUMMIES WOMENS) CHEW Chew 2 each by mouth daily.   omeprazole  (PRILOSEC) 20 MG capsule Take 1 capsule (20 mg total) by mouth daily.   prednisoLONE acetate (PRED FORTE) 1 % ophthalmic suspension Place 1 drop into the left eye See admin instructions. Administer 1 drop into the LEFT eye once daily, may increase to 1 drop 4 times daily as needed for flares,eye irritation.   No facility-administered medications prior to visit.    Review of Systems  Constitutional:  Negative for activity change,  appetite change and unexpected weight change.  Respiratory: Negative.    Cardiovascular: Negative.   Gastrointestinal: Negative.   Endocrine: Negative for cold intolerance and heat intolerance.  Genitourinary: Negative.   Musculoskeletal: Negative.   Skin: Negative.   Neurological: Negative.   Hematological: Negative.   Psychiatric/Behavioral:  Negative for behavioral problems, decreased concentration, dysphoric mood, hallucinations, self-injury, sleep disturbance and suicidal ideas. The patient is not nervous/anxious.         Objective:  BP (!) 140/80 (BP Location: Left Arm, Patient Position: Supine, Cuff Size: Normal)   Pulse 71   Temp 98.6 F (37 C) (Oral)   Ht 5' 6 (1.676 m)   Wt 231 lb 6.4 oz (105 kg)   SpO2 97%   BMI 37.35 kg/m     Physical Exam Vitals and nursing note reviewed.  Constitutional:      General: She is not in acute distress. HENT:     Right Ear: Tympanic membrane, ear canal and external ear normal.     Left Ear: Tympanic membrane, ear canal and external ear normal.     Nose: Nose normal.  Eyes:     Extraocular Movements: Extraocular movements intact.     Conjunctiva/sclera: Conjunctivae normal.     Pupils: Pupils are equal, round, and reactive to light.  Neck:     Thyroid : No thyroid  mass, thyromegaly or thyroid  tenderness.  Cardiovascular:     Rate and Rhythm: Normal rate and regular rhythm.     Pulses: Normal pulses.     Heart sounds: Normal heart sounds.  Pulmonary:  Effort: Pulmonary effort is normal.     Breath sounds: Normal breath sounds.  Abdominal:     General: Bowel sounds are normal.     Palpations: Abdomen is soft.  Musculoskeletal:        General: Normal range of motion.     Cervical back: Normal range of motion and neck supple.     Right lower leg: No edema.     Left lower leg: No edema.  Lymphadenopathy:     Cervical: No cervical adenopathy.  Skin:    General: Skin is warm and dry.  Neurological:     Mental Status:  She is alert and oriented to person, place, and time.     Cranial Nerves: No cranial nerve deficit.  Psychiatric:        Mood and Affect: Mood normal.        Behavior: Behavior normal.        Thought Content: Thought content normal.      Results for orders placed or performed in visit on 09/29/24  Comprehensive metabolic panel with GFR  Result Value Ref Range   Sodium 141 135 - 145 mEq/L   Potassium 3.5 3.5 - 5.1 mEq/L   Chloride 106 96 - 112 mEq/L   CO2 29 19 - 32 mEq/L   Glucose, Bld 98 70 - 99 mg/dL   BUN 13 6 - 23 mg/dL   Creatinine, Ser 8.97 0.40 - 1.20 mg/dL   Total Bilirubin 0.7 0.2 - 1.2 mg/dL   Alkaline Phosphatase 84 39 - 117 U/L   AST 25 0 - 37 U/L   ALT 28 0 - 35 U/L   Total Protein 7.2 6.0 - 8.3 g/dL   Albumin 3.8 3.5 - 5.2 g/dL   GFR 41.92 (L) >39.99 mL/min   Calcium  9.2 8.4 - 10.5 mg/dL  Lipid panel  Result Value Ref Range   Cholesterol 162 0 - 200 mg/dL   Triglycerides 54.9 0.0 - 149.0 mg/dL   HDL 47.69 >60.99 mg/dL   VLDL 9.0 0.0 - 59.9 mg/dL   LDL Cholesterol 898 (H) 0 - 99 mg/dL   Total CHOL/HDL Ratio 3    NonHDL 109.91   Hemoglobin A1c  Result Value Ref Range   Hgb A1c MFr Bld 5.8 4.6 - 6.5 %  VITAMIN D  25 Hydroxy (Vit-D Deficiency, Fractures)  Result Value Ref Range   VITD 51.34 30.00 - 100.00 ng/mL      Assessment & Plan:    Routine Health Maintenance and Physical Exam  Immunization History  Administered Date(s) Administered   PFIZER(Purple Top)SARS-COV-2 Vaccination 02/06/2020, 03/01/2020, 09/04/2020   Tdap 04/18/2017   Zoster Recombinant(Shingrix ) 03/29/2022, 06/29/2022   Zoster, Live 06/29/2022    Health Maintenance  Topic Date Due   Influenza Vaccine  02/03/2025 (Originally 06/06/2024)   Pneumococcal Vaccine: 50+ Years (1 of 2 - PCV) 06/27/2025 (Originally 01/27/1979)   Mammogram  01/07/2025   DTaP/Tdap/Td (2 - Td or Tdap) 04/19/2027   Cervical Cancer Screening (HPV/Pap Cotest)  04/24/2027   Colonoscopy  09/06/2030   Hepatitis C  Screening  Completed   HIV Screening  Completed   Zoster Vaccines- Shingrix   Completed   Hepatitis B Vaccines 19-59 Average Risk  Aged Out   HPV VACCINES  Aged Out   Meningococcal B Vaccine  Aged Out   COVID-19 Vaccine  Discontinued   Discussed health benefits of physical activity, and encouraged her to engage in regular exercise appropriate for her age and condition.  Problem List Items Addressed  This Visit     Dyslipidemia   No adverse effects with atorvastatin  Repeat lipid panel today      Essential hypertension   Elevated BP today Reports compliance with DASH diet and med doses (losartan  and amlodipine ) BP Readings from Last 3 Encounters:  09/29/24 (!) 140/80  07/06/24 (!) 146/89  06/27/24 130/76    Advised to Check BP at home in AM, no less than 3x/week. Call office if BP >140/80 for more than 2days Send BP readings via mychart      Prediabetes   Repeat hgbA1c      Relevant Orders   Hemoglobin A1c (Completed)   Vitamin D  deficiency   Repeat vit.D      Relevant Orders   VITAMIN D  25 Hydroxy (Vit-D Deficiency, Fractures) (Completed)   Other Visit Diagnoses       Encounter for preventative adult health care exam with abnormal findings    -  Primary   Relevant Orders   Comprehensive metabolic panel with GFR (Completed)      Return in about 6 months (around 03/29/2025) for HTN, prediabetes, hyperlipidemia (fasting).     Roselie Mood, NP

## 2024-09-29 NOTE — Patient Instructions (Signed)
 Go to lab Maintain Heart healthy diet and daily exercise. Maintain current medications. Check BP at home in AM, no less than 3x/week. Call office if BP >140/80 for more than 2days Send BP readings via mychart

## 2024-09-29 NOTE — Assessment & Plan Note (Signed)
 Elevated BP today Reports compliance with DASH diet and med doses (losartan  and amlodipine ) BP Readings from Last 3 Encounters:  09/29/24 (!) 140/80  07/06/24 (!) 146/89  06/27/24 130/76    Advised to Check BP at home in AM, no less than 3x/week. Call office if BP >140/80 for more than 2days Send BP readings via mychart

## 2024-10-01 ENCOUNTER — Ambulatory Visit: Payer: Self-pay | Admitting: Nurse Practitioner

## 2024-10-01 ENCOUNTER — Telehealth: Payer: Self-pay

## 2024-10-01 DIAGNOSIS — I10 Essential (primary) hypertension: Secondary | ICD-10-CM

## 2024-10-01 DIAGNOSIS — R7303 Prediabetes: Secondary | ICD-10-CM

## 2024-10-01 NOTE — Telephone Encounter (Signed)
 Patient dropped off blood pressure log. Placed in provider folder in office to review

## 2024-10-01 NOTE — Telephone Encounter (Signed)
 Patient called and informed that Claire Sutton review her BP logged that was dropped off at office. Advised patient to bring BP machine/cuff with her to her next scheduled appointment with [facility]. Patient verbalized understanding and all (if any) questions were answered.   Added BP average to flowsheet.

## 2024-10-06 ENCOUNTER — Telehealth: Payer: Self-pay

## 2024-10-06 NOTE — Telephone Encounter (Signed)
 Copied from CRM [id number]. Topic: Clinical - Medical Advice >> Oct 06, 2024 10:03 AM Robinson H wrote: Reason for CRM: Patient is calling to notify provider that her pressures have been fluctuating 142/94 this morning 165/95 then 138/88, states she was told by provider if it keeps increasing to let her know. Patient states no symptoms.   Salvador [phone]

## 2024-10-08 NOTE — Telephone Encounter (Signed)
 Patient called and asked about the BP readings that she called into the office. She stated that they were all taken on Monday 12/01/2 with 148/96 @ 7:49 AM  7:52 AM, 142/92 at 7:54 AM, 138/88 at 9:55 after take medication BP came down. Asked what her BP readings were for today and she stated that today at 7:48 AM 156/92, 7:49 AM 142/90, 142/91 at 7:52 AM. I asked patient why is she taking her BP that often and so close together in time she stated that she was wanting to see if they went down. She also stated that her mother recently passed and she is not sure if she is harboring that to cause her BP to be so high. Patient also stated that she has 2 BP machines that she uses to check her BP with. I thanked her for this information and advised that I will be giving her a call back in the morning with Charlotte's comments/recommendations. She verbalized understanding and all (if any) were answered.

## 2024-10-09 MED ORDER — LOSARTAN POTASSIUM 100 MG PO TABS: 100.0000 mg | ORAL_TABLET | Freq: Every day | ORAL | 1 refills | Status: DC

## 2024-10-09 NOTE — Telephone Encounter (Signed)
 Increase losartan  dose to 100mg  daily. New Prescription sent. Maintain Amlodipine  dose. She should monitor BP only once in AM and once in PM. She should bring BP machine and BP readings to next appointment with me. She should schedule f/up appointment with me in 2months.

## 2024-10-09 NOTE — Addendum Note (Signed)
 Addended by: KATHEEN GARDEN L on: 10/09/2024 11:57 AM   Modules accepted: Orders

## 2024-10-14 NOTE — Telephone Encounter (Signed)
 Patient called and informed of provider comments and scheduled for a 2 month follow up

## 2024-11-04 ENCOUNTER — Ambulatory Visit
Admission: EM | Admit: 2024-11-04 | Discharge: 2024-11-04 | Disposition: A | Discharge status: Home / Self Care | Payer: PRIVATE HEALTH INSURANCE | Attending: Emergency Medicine | Admitting: Emergency Medicine

## 2024-11-04 DIAGNOSIS — J101 Influenza due to other identified influenza virus with other respiratory manifestations: Secondary | ICD-10-CM | POA: Diagnosis not present

## 2024-11-04 DIAGNOSIS — J111 Influenza due to unidentified influenza virus with other respiratory manifestations: Secondary | ICD-10-CM

## 2024-11-04 LAB — POC COVID19/FLU A&B COMBO
Covid Antigen, POC: NEGATIVE
Influenza A Antigen, POC: POSITIVE — AB
Influenza B Antigen, POC: NEGATIVE

## 2024-11-04 MED ORDER — BENZONATATE 100 MG PO CAPS: 100.0000 mg | ORAL_CAPSULE | Freq: Three times a day (TID) | ORAL | 0 refills | Status: AC

## 2024-11-04 NOTE — ED Triage Notes (Signed)
 Pt c/o cough, nasal drainage that began this past Saturday. The patient reports exposure to the flu.  Home interventions: thraflu, delsym (around 0800)

## 2024-11-04 NOTE — Discharge Instructions (Addendum)
 You have tested positive for Influenza A. Alternate tylenol /ibuprofen  as label directed.You are outisde the window for tamiflu. Push fluids. If you have new or worsening issues or concerns(chest pain,palpitations, shortness of breeath, go to Er for further evaluation).

## 2024-11-04 NOTE — ED Provider Notes (Signed)
 [facility]    CSN: [id number] Arrival date & time: 11/04/24  1027      History   Chief Complaint Chief Complaint  Patient presents with   Cough   Nasal Congestion    HPI JULIANI LADUKE is a 63 y.o. female.   63 year old female, Amillya Chavira, presents to urgent care for evaluation of cough nasal drainage that started 3 days ago.  Patient reports recent exposure to the flu(granddaughter).  Patient is taking TheraFlu and Delsym  The history is provided by the patient. No language interpreter was used.  Cough Associated symptoms: fever     Past Medical History:  Diagnosis Date   Allergy     ASCUS (atypical squamous cells of undetermined significance) on Pap smear 07/2006   NEG HIGH RISK HPV-- THEN IN 10/2006 AND 07/2007 PAPS WITH BENIGN REACTIVE CHANGES   Asthma    Cataract 2024   bilateral   GERD (gastroesophageal reflux disease)    Headache    History of blood clot in brain    a. Fall age 13 - was told she had a blood clot in the brain from this.   Hyperlipidemia    Hypertension    Iritis of both eyes    LGSIL (low grade squamous intraepithelial dysplasia) 08/2004   PAP ON 02/2005, 7,8,9,10,11,12WNL   Obesity    Seizures (HCC)    a. began after she had a fall at age 30, last seizure age 63.    Patient Active Problem List   Diagnosis Date Noted   Influenza A 11/04/2024   Fatigue 05/27/2024   Postural dizziness with presyncope 05/19/2024   Prediabetes 07/03/2023   Seasonal allergic rhinitis 12/29/2021   Neural foraminal stenosis of lumbar spine 09/21/2021   DDD (degenerative disc disease), lumbar 09/21/2021   Numbness of left foot 09/21/2021   Chronic bilateral low back pain with bilateral sciatica 08/31/2021   Postmenopausal estrogen deficiency 01/22/2019   History of colon polyps 06/13/2018   Hip pain, chronic 03/07/2018   Vitamin D  deficiency 04/18/2017   Mild intermittent asthma without complication 03/14/2017   Abnormal EKG 10/18/2016    Dyslipidemia 10/18/2016   Essential hypertension 09/15/2016   Obesity    Gastroesophageal reflux disease without esophagitis 09/07/2016   Scleritis and episcleritis of left eye 06/16/2014    Past Surgical History:  Procedure Laterality Date   CATARACT EXTRACTION, BILATERAL Bilateral 11/23/2022   COLPOSCOPY     ENDOMETRIAL ABLATION  02/2007   NOVASURE/MARSUPILIZATION OF BARTHOLIN   EYE SURGERY  11/09/2022 and 11/23/2022   Cataract surgery   MARSUPILIZATION OF BARTHOLIN  02/2007   AT TIME OF NOVASURE ABLATION SURGERY   MOUTH SURGERY     RETINAL LASER PROCEDURE Left 12/12/2023   TUBAL LIGATION      OB History     Gravida  3   Para  2   Term      Preterm      AB  1   Living  2      SAB      IAB  1   Ectopic      Multiple      Live Births               Home Medications    Prior to Admission medications  Medication Sig Start Date End Date Taking? Authorizing Provider  benzonatate  (TESSALON ) 100 MG capsule Take 1 capsule (100 mg total) by mouth every 8 (eight) hours. 11/04/24  Yes Alona Danford, Rilla, NP  albuterol  (VENTOLIN  HFA) 108 (90 Base) MCG/ACT inhaler Inhale 2 puffs into the lungs every 6 (six) hours as needed for wheezing or shortness of breath. 01/09/24   Nche, Roselie Rockford, NP  amLODipine  (NORVASC ) 5 MG tablet Take 1 tablet (5 mg total) by mouth daily. 01/09/24   Nche, Roselie Rockford, NP  atorvastatin  (LIPITOR) 20 MG tablet TAKE 1 TABLET(20 MG) BY MOUTH DAILY 01/09/24   Nche, Roselie Rockford, NP  Bromfenac Sodium 0.07 % SOLN Place 1 drop into the left eye daily.    [provider]  cetirizine (ZYRTEC) 10 MG tablet Take 10 mg by mouth daily. 12/03/15   [provider]  Cholecalciferol  (VITAMIN D -3 PO) Take 2 each by mouth daily. OTC vitamin D -3 gummy    [provider]  diclofenac  Sodium (VOLTAREN ) 1 % GEL Apply 1 Application topically 2 (two) times daily as needed (hip pain).    [provider]  doxycycline  (VIBRAMYCIN )  100 MG capsule Take 1 capsule (100 mg total) by mouth 2 (two) times daily. 07/06/24   Enedelia Dorna HERO, FNP  EPINEPHrine  0.3 mg/0.3 mL IJ SOAJ injection Inject 0.3 mg into the muscle as needed for anaphylaxis. 05/27/21   Nche, Roselie Rockford, NP  losartan  (COZAAR ) 100 MG tablet Take 1 tablet (100 mg total) by mouth daily. 10/09/24   Nche, Roselie Rockford, NP  montelukast  (SINGULAIR ) 10 MG tablet TAKE 1 TABLET(10 MG) BY MOUTH AT BEDTIME 01/09/24   Nche, Charlotte Lum, NP  Multiple Vitamins-Minerals (MULTIVITAMIN GUMMIES WOMENS) CHEW Chew 2 each by mouth daily.    [provider]  omeprazole  (PRILOSEC) 20 MG capsule Take 1 capsule (20 mg total) by mouth daily. 01/09/24   Nche, Roselie Rockford, NP  prednisoLONE acetate (PRED FORTE) 1 % ophthalmic suspension Place 1 drop into the left eye See admin instructions. Administer 1 drop into the LEFT eye once daily, may increase to 1 drop 4 times daily as needed for flares,eye irritation. 12/04/23   [provider]    Family History Family History  Problem Relation Age of Onset   Hypertension Mother    Hyperlipidemia Mother    Heart disease Mother        was told she may have had a few MIs that she was unaware of   Diabetes Father    Arthritis Sister        lumbar DDD with spinal stenosis   Hypertension Sister    Arthritis Son    Diabetes Paternal Grandfather    Colon cancer Neg Hx    Colon polyps Neg Hx    Esophageal cancer Neg Hx    Stomach cancer Neg Hx    Rectal cancer Neg Hx    Breast cancer Neg Hx     Social History Social History[1]   Allergies   Codeine and Flexeril  [cyclobenzaprine ]   Review of Systems Review of Systems  Constitutional:  Positive for fever.  HENT:  Positive for congestion.   Respiratory:  Positive for cough.   All other systems reviewed and are negative.    Physical Exam Triage Vital Signs ED Triage Vitals  Encounter Vitals Group     BP 11/04/24 1113 (!) 143/90     Girls Systolic BP  Percentile --      Girls Diastolic BP Percentile --      Boys Systolic BP Percentile --      Boys Diastolic BP Percentile --      Pulse Rate 11/04/24 1113 84     Resp  11/04/24 1113 18     Temp 11/04/24 1113 (!) 100.6 F (38.1 C)     Temp Source 11/04/24 1113 Oral     SpO2 11/04/24 1113 96 %     Weight --      Height --      Head Circumference --      Peak Flow --      Pain Score 11/04/24 1112 8     Pain Loc --      Pain Education --      Exclude from Growth Chart --    No data found.  Updated Vital Signs BP (!) 143/90 (BP Location: Right Arm)   Pulse 84   Temp (!) 100.6 F (38.1 C) (Oral)   Resp 18   SpO2 96%   Visual Acuity Right Eye Distance:   Left Eye Distance:   Bilateral Distance:    Right Eye Near:   Left Eye Near:    Bilateral Near:     Physical Exam Vitals and nursing note reviewed.  Constitutional:      General: She is not in acute distress.    Appearance: She is well-developed and well-groomed.  HENT:     Head: Normocephalic.     Right Ear: Tympanic membrane is retracted.     Left Ear: Tympanic membrane is retracted.     Nose: Congestion present.     Mouth/Throat:     Lips: Pink.     Mouth: Mucous membranes are moist.     Pharynx: Oropharynx is clear.  Eyes:     General: Lids are normal.     Conjunctiva/sclera: Conjunctivae normal.     Pupils: Pupils are equal, round, and reactive to light.  Neck:     Trachea: No tracheal deviation.  Cardiovascular:     Rate and Rhythm: Normal rate and regular rhythm.     Heart sounds: Normal heart sounds. No murmur heard. Pulmonary:     Effort: Pulmonary effort is normal.     Breath sounds: Normal breath sounds and air entry.  Abdominal:     General: Bowel sounds are normal.     Palpations: Abdomen is soft.     Tenderness: There is no abdominal tenderness.  Musculoskeletal:        General: Normal range of motion.     Cervical back: Normal range of motion.  Lymphadenopathy:     Cervical: No cervical  adenopathy.  Skin:    General: Skin is warm and dry.     Findings: No rash.  Neurological:     General: No focal deficit present.     Mental Status: She is alert and oriented to person, place, and time.     GCS: GCS eye subscore is 4. GCS verbal subscore is 5. GCS motor subscore is 6.  Psychiatric:        Speech: Speech normal.        Behavior: Behavior normal. Behavior is cooperative.      UC Treatments / Results  Labs (all labs ordered are listed, but only abnormal results are displayed) Labs Reviewed  POC COVID19/FLU A&B COMBO - Abnormal; Notable for the following components:      Result Value   Influenza A Antigen, POC Positive (*)    All other components within normal limits    EKG   Radiology No results found.  Procedures Procedures (including critical care time)  Medications Ordered in UC Medications - No data to display  Initial Impression / Assessment and  Plan / UC Course  I have reviewed the triage vital signs and the nursing notes.  Pertinent labs & imaging results that were available during my care of the patient were reviewed by me and considered in my medical decision making (see chart for details).     Discussed exam findings and plan of care with patient :you have tested positive for Influenza A. Alternate tylenol /ibuprofen  as label directed.You are outisde the window for tamiflu.  Scripted Tessalon  for cough ,push fluids. If you have new or worsening issues or concerns(chest pain,palpitations, shortness of breeath, go to Er for further evaluation).  Patient verbalized understanding to this provider  Ddx: Influenza A, cough,allergies Final Clinical Impressions(s) / UC Diagnoses   Final diagnoses:  Influenza-like illness  Influenza A     Discharge Instructions      You have tested positive for Influenza A. Alternate tylenol /ibuprofen  as label directed.You are outisde the window for tamiflu. Push fluids. If you have new or worsening issues or  concerns(chest pain,palpitations, shortness of breeath, go to Er for further evaluation).    ED Prescriptions     Medication Sig Dispense Auth. Provider   benzonatate  (TESSALON ) 100 MG capsule Take 1 capsule (100 mg total) by mouth every 8 (eight) hours. 21 capsule Elesha Thedford, NP      PDMP not reviewed this encounter.    [1]  Social History Tobacco Use   Smoking status: Never    Passive exposure: Past   Smokeless tobacco: Never  Vaping Use   Vaping status: Never Used  Substance Use Topics   Alcohol use: Never   Drug use: Never     Shevette Bess, Rilla, NP 11/04/24 2206  "

## 2024-11-05 ENCOUNTER — Other Ambulatory Visit: Payer: Self-pay | Admitting: Nurse Practitioner

## 2024-11-05 DIAGNOSIS — J4520 Mild intermittent asthma, uncomplicated: Secondary | ICD-10-CM

## 2024-11-05 DIAGNOSIS — K219 Gastro-esophageal reflux disease without esophagitis: Secondary | ICD-10-CM

## 2024-11-05 DIAGNOSIS — E785 Hyperlipidemia, unspecified: Secondary | ICD-10-CM

## 2024-11-05 DIAGNOSIS — I10 Essential (primary) hypertension: Secondary | ICD-10-CM

## 2024-11-05 NOTE — Telephone Encounter (Signed)
 Unable to pend eye drops

## 2024-11-05 NOTE — Telephone Encounter (Unsigned)
 Copied from CRM #[id number]. Topic: Clinical - Medication Refill >> Nov 05, 2024  9:12 AM Macario HERO wrote: Medication: amLODipine  (NORVASC ) 5 MG tablet [[id number]] atorvastatin  (LIPITOR) 20 MG tablet [[id number]] losartan  (COZAAR ) 100 MG tablet [[id number]] montelukast  (SINGULAIR ) 10 MG tablet [[id number]] omeprazole  (PRILOSEC) 20 MG capsule [[id number]] Lotemax Eyedrops albuterol  (VENTOLIN  HFA) 108 (90 Base) MCG/ACT inhaler [[id number]] EPINEPHrine  0.3 mg/0.3 mL IJ SOAJ injection [[id number]]  Has the patient contacted their pharmacy? Yes (Agent: If no, request that the patient contact the pharmacy for the refill. If patient does not wish to contact the pharmacy document the reason why and proceed with request.) (Agent: If yes, when and what did the pharmacy advise?)  This is the patient's preferred pharmacy:  [facility] - [place], [place] - [address] [place] [address] Phone: [phone] Fax: [phone]  Is this the correct pharmacy for this prescription? Yes If no, delete pharmacy and type the correct one.   Has the prescription been filled recently? Yes  Is the patient out of the medication? Yes  Has the patient been seen for an appointment in the last year OR does the patient have an upcoming appointment? Yes  Can we respond through MyChart? Yes  Agent: Please be advised that Rx refills may take up to 3 business days. We ask that you follow-up with your pharmacy.

## 2024-11-07 MED ORDER — OMEPRAZOLE 20 MG PO CPDR: 20.0000 mg | DELAYED_RELEASE_CAPSULE | Freq: Every day | ORAL | 3 refills | Status: AC

## 2024-11-07 MED ORDER — LOSARTAN POTASSIUM 100 MG PO TABS: 100.0000 mg | ORAL_TABLET | Freq: Every day | ORAL | 1 refills | Status: AC

## 2024-11-07 MED ORDER — ALBUTEROL SULFATE HFA 108 (90 BASE) MCG/ACT IN AERS: 2.0000 | INHALATION_SPRAY | Freq: Four times a day (QID) | RESPIRATORY_TRACT | 1 refills | Status: AC | PRN

## 2024-11-07 MED ORDER — EPINEPHRINE 0.3 MG/0.3ML IJ SOAJ: 0.3000 mg | INTRAMUSCULAR | 1 refills | Status: AC | PRN

## 2024-11-07 MED ORDER — AMLODIPINE BESYLATE 5 MG PO TABS: 5.0000 mg | ORAL_TABLET | Freq: Every day | ORAL | 3 refills | Status: AC

## 2024-11-07 MED ORDER — ATORVASTATIN CALCIUM 20 MG PO TABS: ORAL_TABLET | ORAL | 3 refills | Status: AC

## 2024-11-07 MED ORDER — MONTELUKAST SODIUM 10 MG PO TABS: ORAL_TABLET | ORAL | 3 refills | Status: AC

## 2024-11-07 NOTE — Telephone Encounter (Signed)
 Requesting: amLODipine  (NORVASC ) 5 MG tablet , losartan  (COZAAR ) 100 MG tablet , atorvastatin  (LIPITOR) 20 MG tablet , montelukast  (SINGULAIR ) 10 MG tablet , omeprazole  (PRILOSEC) 20 MG capsule , EPINEPHrine  0.3 mg/0.3 mL IJ SOAJ injection , albuterol  (VENTOLIN  HFA) 108 (90 Base) MCG/ACT inhaler   Last Visit: 09/29/2024 Next Visit: 12/30/2024 Last Refill: 01/09/24,10/09/24, 01/09/24,01/09/24,05/27/21,01/09/24  Please Advise

## 2024-12-29 ENCOUNTER — Ambulatory Visit: Payer: PRIVATE HEALTH INSURANCE | Admitting: Nurse Practitioner

## 2024-12-30 ENCOUNTER — Ambulatory Visit: Payer: PRIVATE HEALTH INSURANCE | Admitting: Nurse Practitioner

## 2025-03-10 ENCOUNTER — Ambulatory Visit: Admitting: Nurse Practitioner

## 2025-04-29 ENCOUNTER — Ambulatory Visit: Payer: PRIVATE HEALTH INSURANCE | Admitting: Obstetrics and Gynecology
